# Patient Record
Sex: Male | Born: 1940
Health system: Southern US, Community
[De-identification: ages and names within clinical notes are randomized; demographics above are authoritative.]

## PROBLEM LIST (undated history)

## (undated) DIAGNOSIS — R519 Headache, unspecified: Secondary | ICD-10-CM

## (undated) DIAGNOSIS — E039 Hypothyroidism, unspecified: Secondary | ICD-10-CM

## (undated) DIAGNOSIS — F419 Anxiety disorder, unspecified: Secondary | ICD-10-CM

## (undated) DIAGNOSIS — F32A Depression, unspecified: Secondary | ICD-10-CM

## (undated) DIAGNOSIS — J189 Pneumonia, unspecified organism: Secondary | ICD-10-CM

## (undated) DIAGNOSIS — C801 Malignant (primary) neoplasm, unspecified: Secondary | ICD-10-CM

## (undated) HISTORY — PX: KNEE SURGERY: SHX244

## (undated) HISTORY — PX: HERNIA REPAIR: SHX51

## (undated) HISTORY — PX: PROSTATE BIOPSY: SHX241

## (undated) HISTORY — PX: ABDOMINAL SURGERY: SHX537

---

## 1999-03-30 ENCOUNTER — Encounter: Admission: RE | Admit: 1999-03-30 | Discharge: 1999-03-30 | Payer: Self-pay | Admitting: Family Medicine

## 1999-04-02 ENCOUNTER — Encounter: Payer: Self-pay | Admitting: Emergency Medicine

## 1999-04-02 ENCOUNTER — Emergency Department (HOSPITAL_COMMUNITY): Admission: EM | Admit: 1999-04-02 | Discharge: 1999-04-02 | Payer: Self-pay | Admitting: Emergency Medicine

## 2000-09-23 ENCOUNTER — Encounter: Admission: RE | Admit: 2000-09-23 | Discharge: 2000-09-23 | Payer: Self-pay | Admitting: Family Medicine

## 2000-09-23 ENCOUNTER — Encounter: Payer: Self-pay | Admitting: Family Medicine

## 2000-10-01 ENCOUNTER — Other Ambulatory Visit: Admission: RE | Admit: 2000-10-01 | Discharge: 2000-10-01 | Payer: Self-pay | Admitting: *Deleted

## 2000-10-01 ENCOUNTER — Encounter (INDEPENDENT_AMBULATORY_CARE_PROVIDER_SITE_OTHER): Payer: Self-pay | Admitting: Specialist

## 2000-10-01 ENCOUNTER — Encounter (INDEPENDENT_AMBULATORY_CARE_PROVIDER_SITE_OTHER): Payer: Self-pay | Admitting: *Deleted

## 2000-10-01 ENCOUNTER — Inpatient Hospital Stay (HOSPITAL_COMMUNITY): Admission: AD | Admit: 2000-10-01 | Discharge: 2000-10-29 | Payer: Self-pay | Admitting: *Deleted

## 2000-10-02 ENCOUNTER — Encounter: Payer: Self-pay | Admitting: *Deleted

## 2000-10-06 ENCOUNTER — Encounter: Payer: Self-pay | Admitting: Hematology and Oncology

## 2000-10-15 ENCOUNTER — Encounter: Payer: Self-pay | Admitting: *Deleted

## 2000-10-17 ENCOUNTER — Encounter: Payer: Self-pay | Admitting: *Deleted

## 2000-11-04 ENCOUNTER — Inpatient Hospital Stay (HOSPITAL_COMMUNITY): Admission: AD | Admit: 2000-11-04 | Discharge: 2000-11-20 | Payer: Self-pay | Admitting: *Deleted

## 2000-11-15 ENCOUNTER — Encounter: Payer: Self-pay | Admitting: *Deleted

## 2000-12-04 ENCOUNTER — Inpatient Hospital Stay (HOSPITAL_COMMUNITY): Admission: AD | Admit: 2000-12-04 | Discharge: 2000-12-25 | Payer: Self-pay | Admitting: *Deleted

## 2000-12-14 ENCOUNTER — Encounter: Payer: Self-pay | Admitting: *Deleted

## 2000-12-19 ENCOUNTER — Encounter: Payer: Self-pay | Admitting: *Deleted

## 2001-01-08 ENCOUNTER — Encounter (INDEPENDENT_AMBULATORY_CARE_PROVIDER_SITE_OTHER): Payer: Self-pay | Admitting: Specialist

## 2001-01-08 ENCOUNTER — Other Ambulatory Visit: Admission: RE | Admit: 2001-01-08 | Discharge: 2001-01-08 | Payer: Self-pay | Admitting: *Deleted

## 2001-01-21 ENCOUNTER — Inpatient Hospital Stay (HOSPITAL_COMMUNITY): Admission: AD | Admit: 2001-01-21 | Discharge: 2001-02-11 | Payer: Self-pay | Admitting: *Deleted

## 2001-02-03 ENCOUNTER — Encounter: Payer: Self-pay | Admitting: *Deleted

## 2001-02-17 ENCOUNTER — Encounter (HOSPITAL_COMMUNITY): Admission: RE | Admit: 2001-02-17 | Discharge: 2001-05-18 | Payer: Self-pay | Admitting: *Deleted

## 2001-03-05 ENCOUNTER — Inpatient Hospital Stay (HOSPITAL_COMMUNITY): Admission: EM | Admit: 2001-03-05 | Discharge: 2001-04-01 | Payer: Self-pay | Admitting: *Deleted

## 2001-03-16 ENCOUNTER — Encounter: Payer: Self-pay | Admitting: *Deleted

## 2001-04-08 ENCOUNTER — Encounter (INDEPENDENT_AMBULATORY_CARE_PROVIDER_SITE_OTHER): Payer: Self-pay | Admitting: Specialist

## 2001-04-08 ENCOUNTER — Other Ambulatory Visit: Admission: RE | Admit: 2001-04-08 | Discharge: 2001-04-08 | Payer: Self-pay | Admitting: Oncology

## 2001-05-29 ENCOUNTER — Encounter: Payer: Self-pay | Admitting: *Deleted

## 2001-05-29 ENCOUNTER — Ambulatory Visit (HOSPITAL_COMMUNITY): Admission: RE | Admit: 2001-05-29 | Discharge: 2001-05-29 | Payer: Self-pay | Admitting: *Deleted

## 2001-09-16 ENCOUNTER — Other Ambulatory Visit: Admission: RE | Admit: 2001-09-16 | Discharge: 2001-09-16 | Payer: Self-pay | Admitting: *Deleted

## 2001-09-16 ENCOUNTER — Encounter (INDEPENDENT_AMBULATORY_CARE_PROVIDER_SITE_OTHER): Payer: Self-pay | Admitting: *Deleted

## 2002-03-17 ENCOUNTER — Encounter (INDEPENDENT_AMBULATORY_CARE_PROVIDER_SITE_OTHER): Payer: Self-pay | Admitting: Specialist

## 2002-03-17 ENCOUNTER — Other Ambulatory Visit: Admission: RE | Admit: 2002-03-17 | Discharge: 2002-03-17 | Payer: Self-pay | Admitting: *Deleted

## 2002-10-29 ENCOUNTER — Other Ambulatory Visit: Admission: RE | Admit: 2002-10-29 | Discharge: 2002-10-29 | Payer: Self-pay | Admitting: *Deleted

## 2002-10-29 ENCOUNTER — Encounter (INDEPENDENT_AMBULATORY_CARE_PROVIDER_SITE_OTHER): Payer: Self-pay | Admitting: Specialist

## 2003-05-04 ENCOUNTER — Encounter (INDEPENDENT_AMBULATORY_CARE_PROVIDER_SITE_OTHER): Payer: Self-pay | Admitting: *Deleted

## 2003-05-04 ENCOUNTER — Ambulatory Visit (HOSPITAL_COMMUNITY): Admission: RE | Admit: 2003-05-04 | Discharge: 2003-05-04 | Payer: Self-pay | Admitting: Oncology

## 2003-08-09 ENCOUNTER — Encounter: Admission: RE | Admit: 2003-08-09 | Discharge: 2003-08-09 | Payer: Self-pay | Admitting: Family Medicine

## 2003-08-22 ENCOUNTER — Encounter (INDEPENDENT_AMBULATORY_CARE_PROVIDER_SITE_OTHER): Payer: Self-pay | Admitting: *Deleted

## 2003-08-22 ENCOUNTER — Ambulatory Visit (HOSPITAL_COMMUNITY): Admission: RE | Admit: 2003-08-22 | Discharge: 2003-08-22 | Payer: Self-pay | Admitting: Gastroenterology

## 2003-08-25 ENCOUNTER — Ambulatory Visit (HOSPITAL_COMMUNITY): Admission: RE | Admit: 2003-08-25 | Discharge: 2003-08-25 | Payer: Self-pay | Admitting: Gastroenterology

## 2003-09-14 ENCOUNTER — Encounter (INDEPENDENT_AMBULATORY_CARE_PROVIDER_SITE_OTHER): Payer: Self-pay | Admitting: *Deleted

## 2003-09-14 ENCOUNTER — Inpatient Hospital Stay (HOSPITAL_COMMUNITY): Admission: RE | Admit: 2003-09-14 | Discharge: 2003-10-01 | Payer: Self-pay | Admitting: General Surgery

## 2003-11-10 ENCOUNTER — Ambulatory Visit (HOSPITAL_COMMUNITY): Admission: RE | Admit: 2003-11-10 | Discharge: 2003-11-10 | Payer: Self-pay | Admitting: Oncology

## 2003-11-10 ENCOUNTER — Encounter (INDEPENDENT_AMBULATORY_CARE_PROVIDER_SITE_OTHER): Payer: Self-pay | Admitting: *Deleted

## 2004-04-07 ENCOUNTER — Ambulatory Visit: Payer: Self-pay | Admitting: Oncology

## 2004-05-14 ENCOUNTER — Emergency Department (HOSPITAL_COMMUNITY): Admission: EM | Admit: 2004-05-14 | Discharge: 2004-05-14 | Payer: Self-pay | Admitting: Emergency Medicine

## 2004-05-15 IMAGING — CR DG CHEST 2V
2 series · 2 of 2 positions shown · non-contrast
Comparison: 09/23/00.
COMPARISON: 09/23/00.

CLINICAL DATA: Cough and sneezing.  Question bronchitis or sinusitis.
 DIAGNOSTIC SINUSES COMPLETE

[view not recorded (1 of 2)]
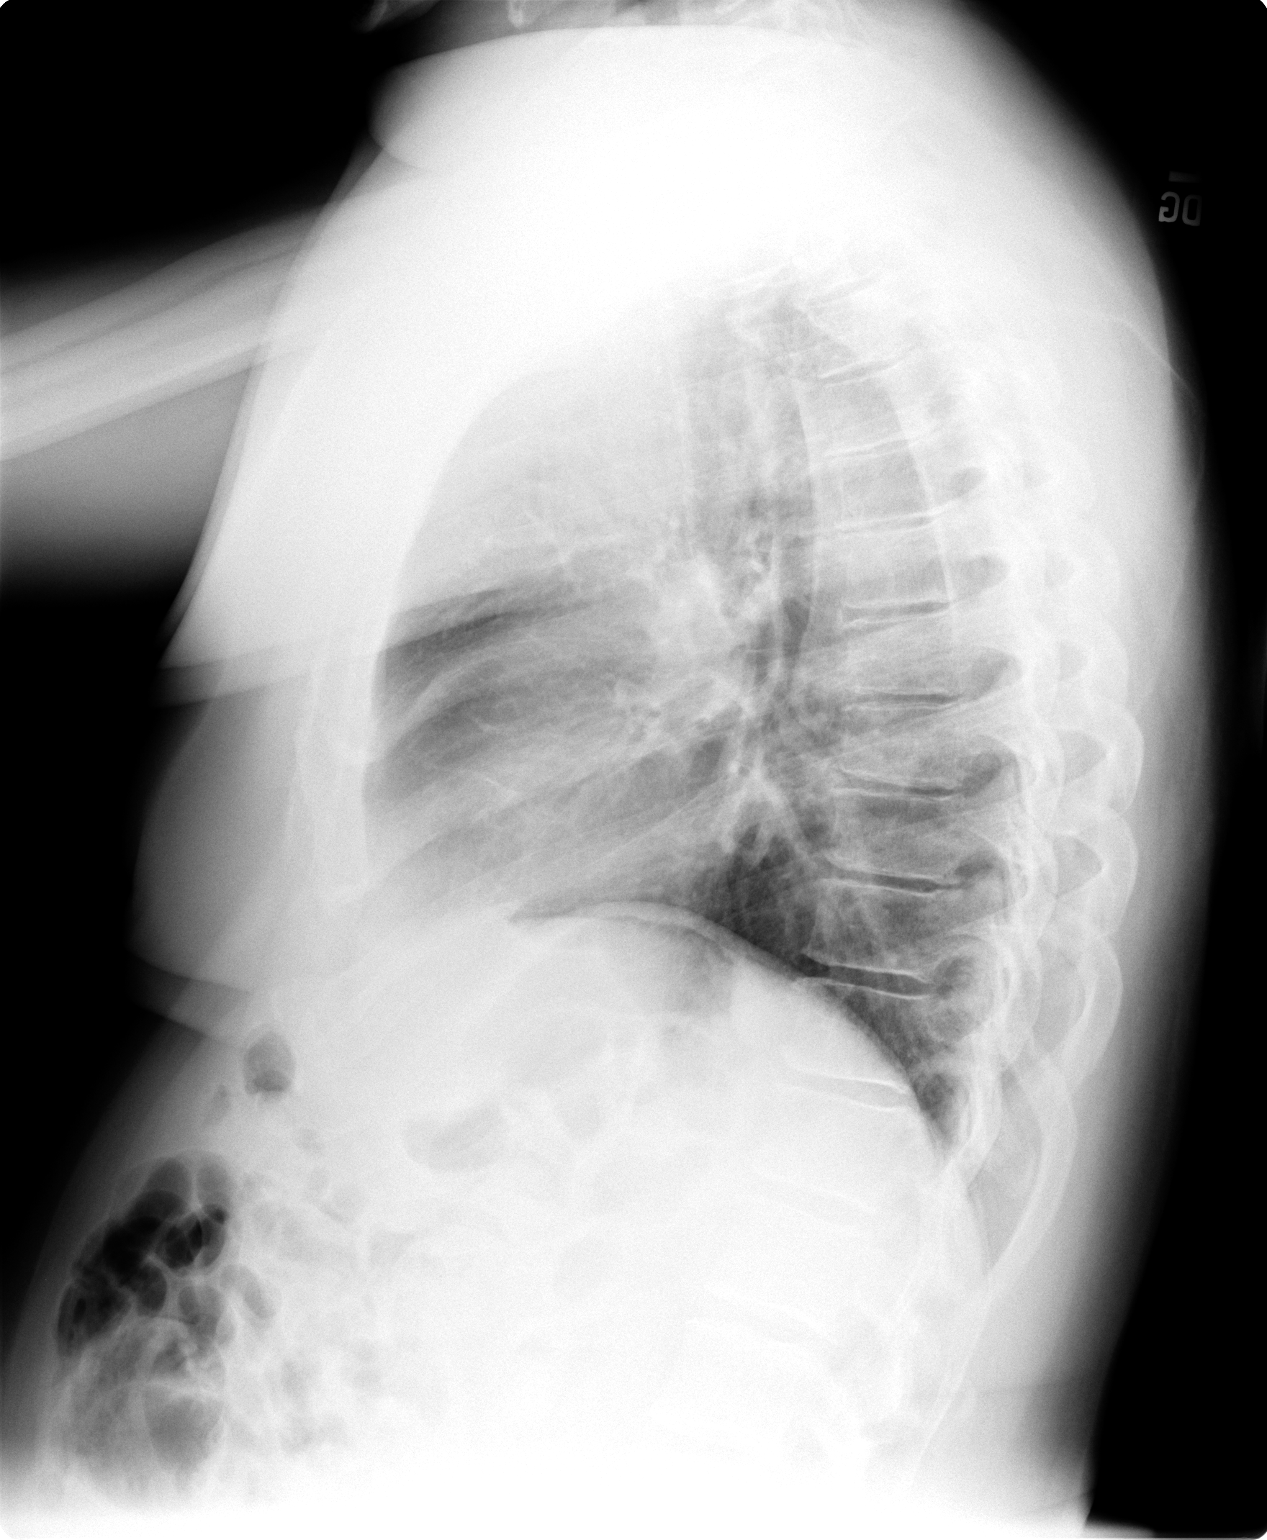

[view not recorded (2 of 2)]
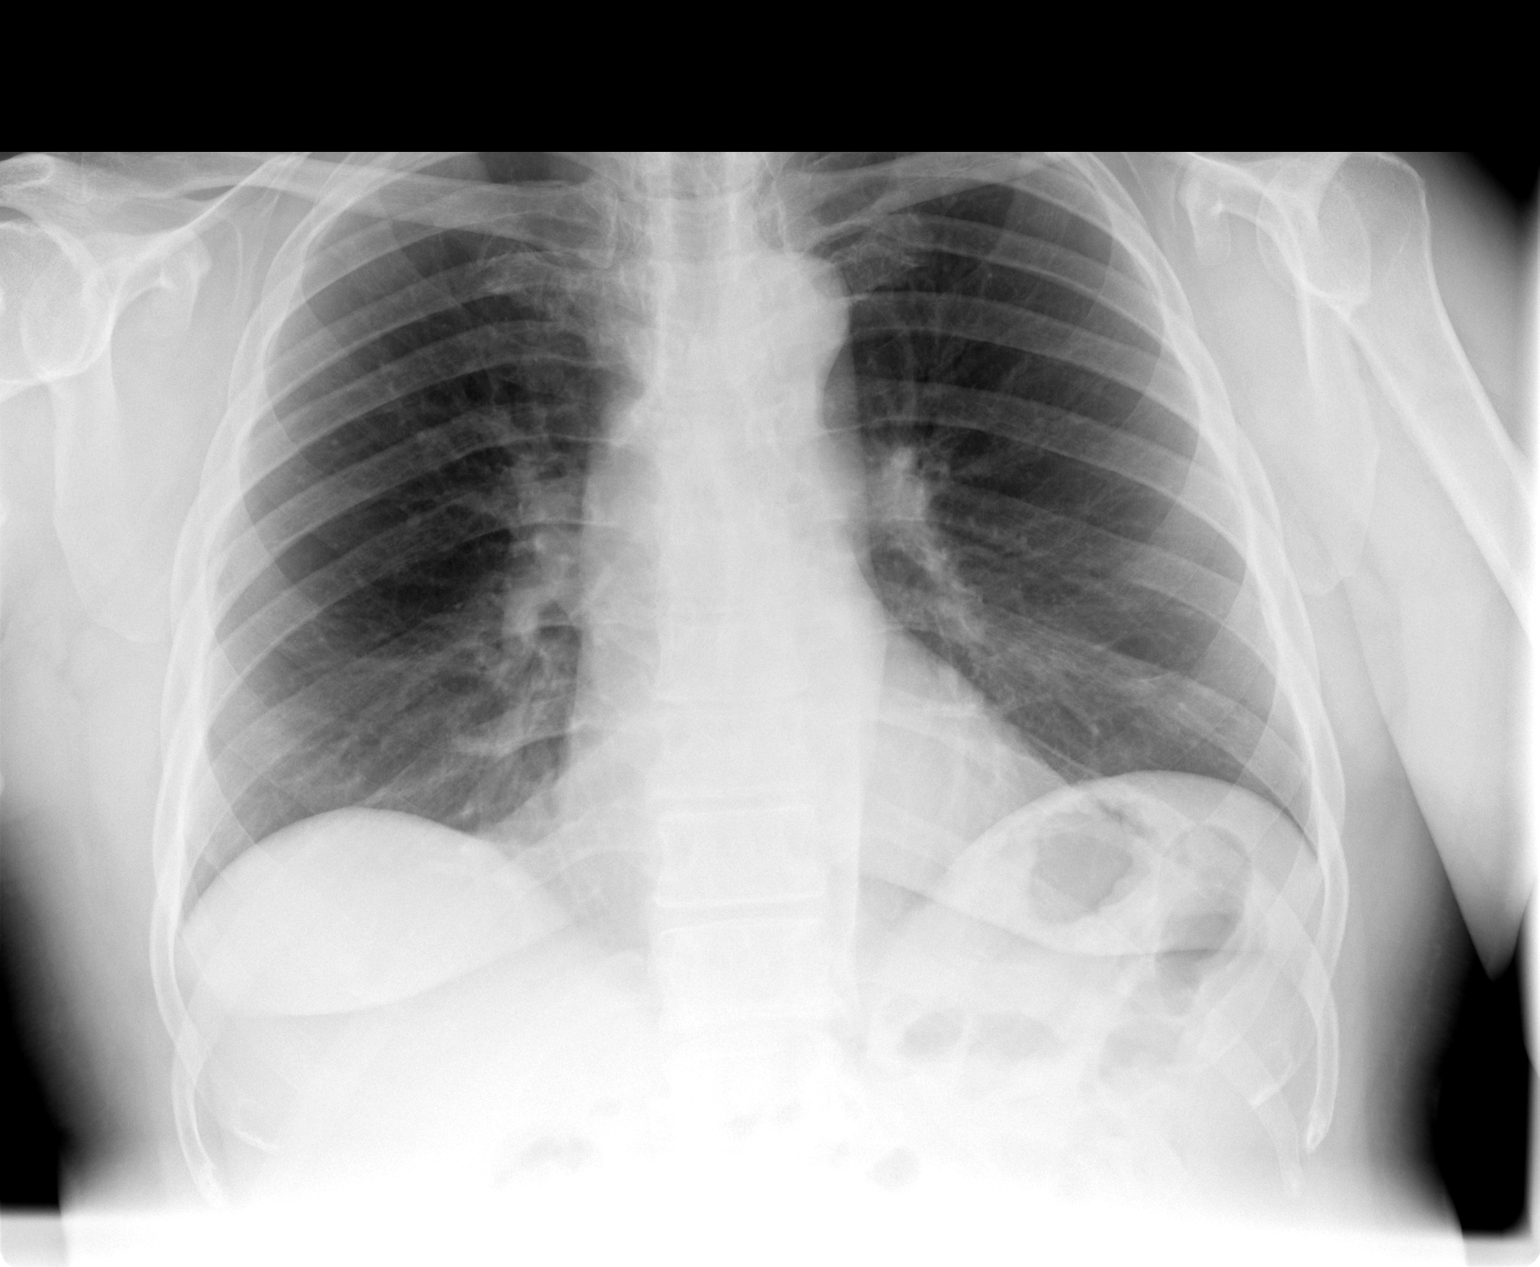

[2 of 2 positions shown; findings below may reference images not displayed]

Mild mucosal thickening in both maxillary sinuses appears stable.  The frontal, sphenoid, and ethmoid sinuses appear clear and no air fluid level is demonstrated.
 IMPRESSION
 Stable maxillary sinus mucosal thickening.
 PA AND LATERAL CHEST
The cardiomediastinal contours are stable.  There is mild peribronchial thickening, but no hyperinflation or confluent air space opacity.  There is no pleural effusion.  Osseous structures appear unremarkable.
 IMPRESSION
 Mild chronic peribronchial thickening.  No acute findings.

## 2004-05-31 ENCOUNTER — Ambulatory Visit: Payer: Self-pay | Admitting: Oncology

## 2004-05-31 ENCOUNTER — Ambulatory Visit (HOSPITAL_COMMUNITY): Admission: RE | Admit: 2004-05-31 | Discharge: 2004-05-31 | Payer: Self-pay | Admitting: Oncology

## 2004-05-31 ENCOUNTER — Encounter (INDEPENDENT_AMBULATORY_CARE_PROVIDER_SITE_OTHER): Payer: Self-pay | Admitting: *Deleted

## 2004-06-14 IMAGING — CR DG CHEST 2V
2 series · 2 of 2 positions shown · non-contrast
Comparison: 08/09/03.

CLINICAL DATA: Mass.  
 CHEST, TWO VIEWS ([DATE] HOURS)

[view not recorded (1 of 2)]
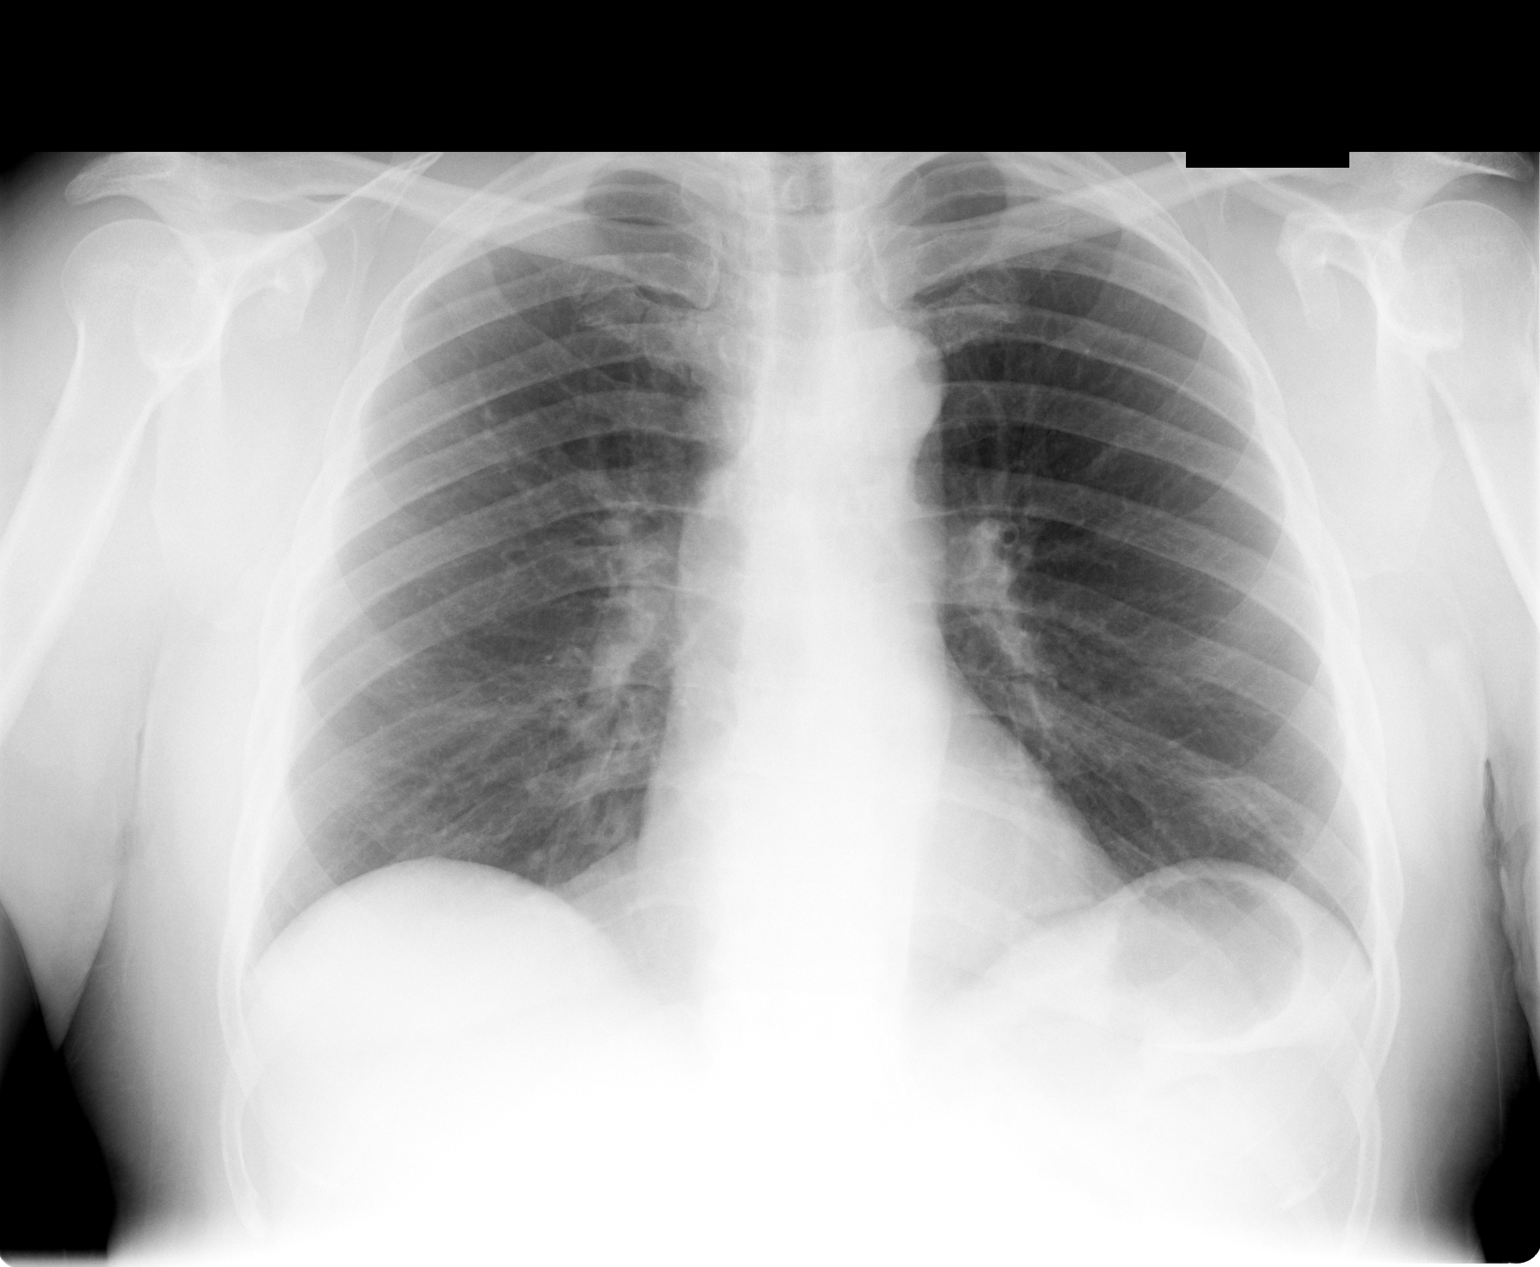

[view not recorded (2 of 2)]
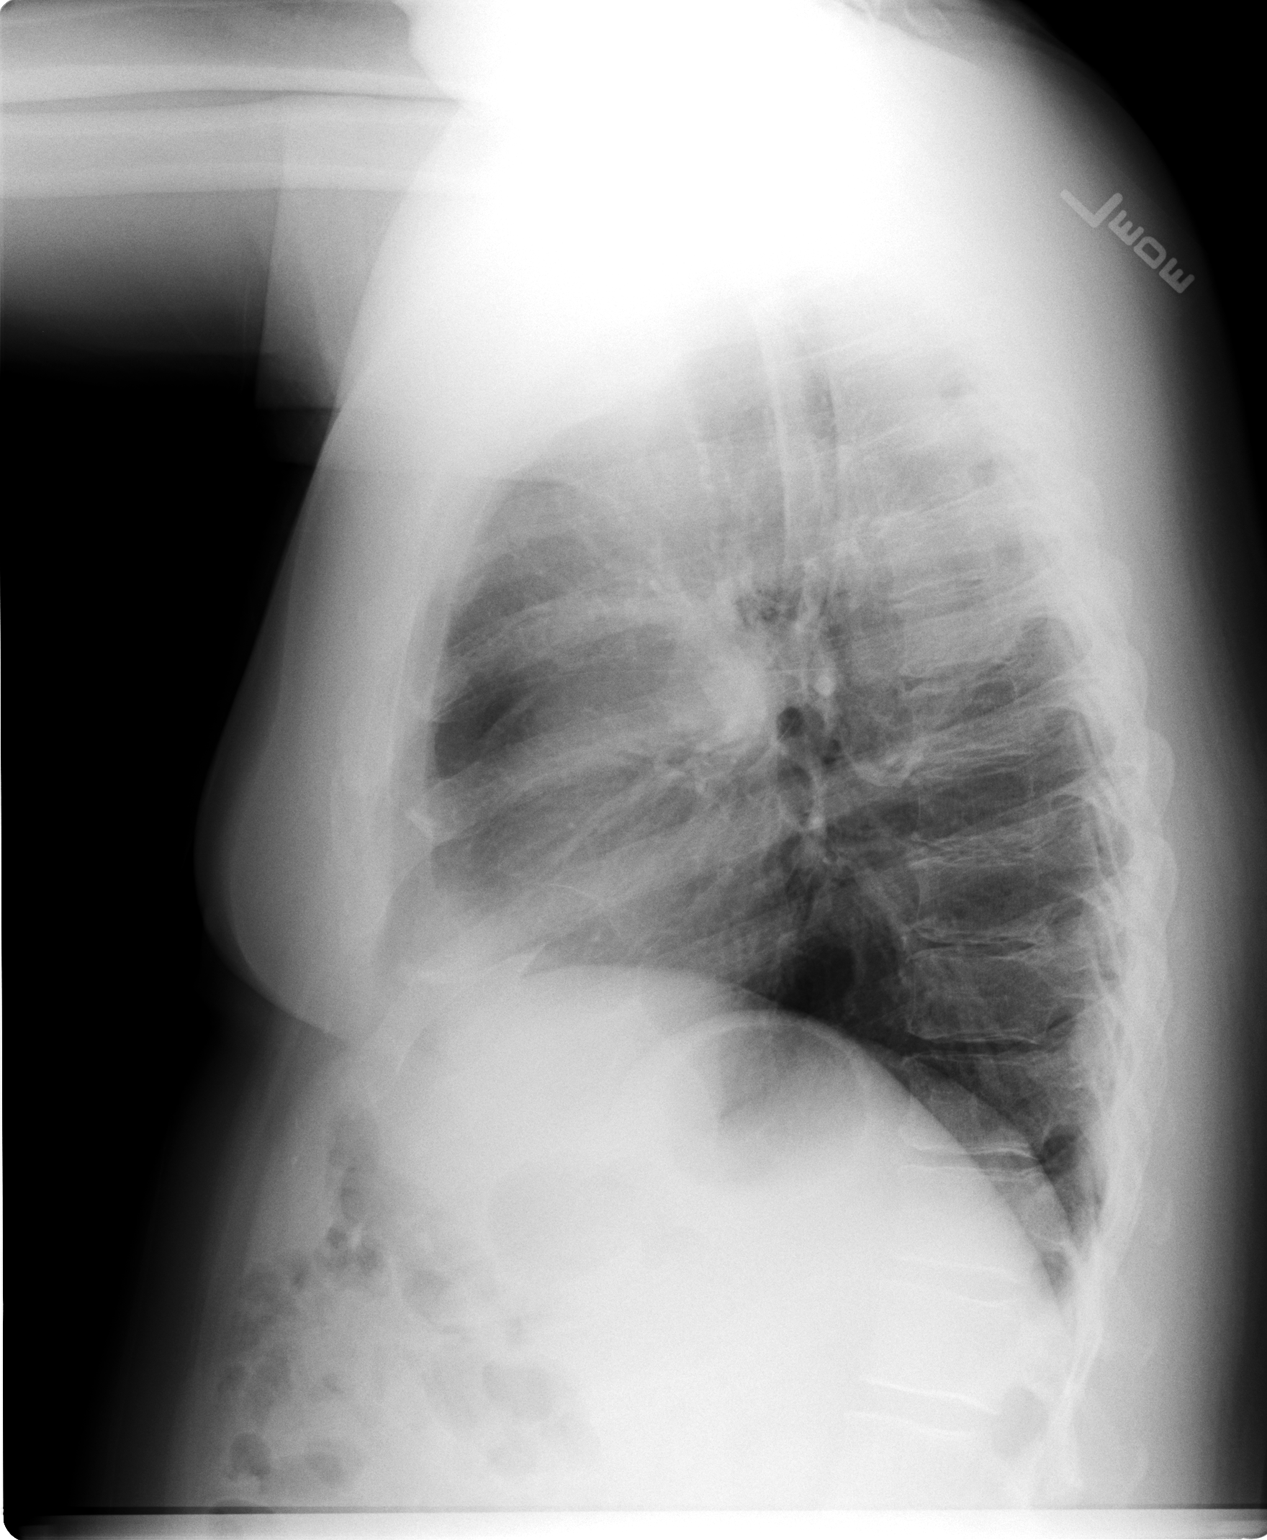

[2 of 2 positions shown; findings below may reference images not displayed]

FINDINGS: Peribronchial thickening is stable.  The heart is normal in size.  Triangular opacity at the right cardiophrenic angle is likely epicardial fat pad and this is stable.  No pneumothoraces or effusions are seen. 
 IMPRESSION
 Stable peribronchial thickening.

## 2004-09-05 ENCOUNTER — Ambulatory Visit (HOSPITAL_COMMUNITY): Admission: RE | Admit: 2004-09-05 | Discharge: 2004-09-05 | Payer: Self-pay | Admitting: Gastroenterology

## 2004-09-05 ENCOUNTER — Encounter (INDEPENDENT_AMBULATORY_CARE_PROVIDER_SITE_OTHER): Payer: Self-pay | Admitting: *Deleted

## 2004-11-03 ENCOUNTER — Inpatient Hospital Stay (HOSPITAL_COMMUNITY): Admission: RE | Admit: 2004-11-03 | Discharge: 2004-11-08 | Payer: Self-pay | Admitting: General Surgery

## 2005-01-03 ENCOUNTER — Ambulatory Visit: Payer: Self-pay | Admitting: Oncology

## 2005-01-03 ENCOUNTER — Encounter (INDEPENDENT_AMBULATORY_CARE_PROVIDER_SITE_OTHER): Payer: Self-pay | Admitting: *Deleted

## 2005-01-03 ENCOUNTER — Ambulatory Visit (HOSPITAL_COMMUNITY): Admission: RE | Admit: 2005-01-03 | Discharge: 2005-01-03 | Payer: Self-pay | Admitting: Oncology

## 2005-01-28 ENCOUNTER — Ambulatory Visit: Payer: Self-pay | Admitting: Oncology

## 2005-08-13 ENCOUNTER — Encounter: Admission: RE | Admit: 2005-08-13 | Discharge: 2005-08-13 | Payer: Self-pay | Admitting: Family Medicine

## 2005-08-29 ENCOUNTER — Ambulatory Visit (HOSPITAL_COMMUNITY): Admission: RE | Admit: 2005-08-29 | Discharge: 2005-08-29 | Payer: Self-pay | Admitting: Oncology

## 2005-08-29 ENCOUNTER — Ambulatory Visit: Payer: Self-pay | Admitting: Oncology

## 2005-08-29 ENCOUNTER — Encounter (INDEPENDENT_AMBULATORY_CARE_PROVIDER_SITE_OTHER): Payer: Self-pay | Admitting: *Deleted

## 2005-10-01 ENCOUNTER — Ambulatory Visit: Payer: Self-pay | Admitting: Oncology

## 2006-02-20 ENCOUNTER — Ambulatory Visit: Payer: Self-pay | Admitting: Oncology

## 2006-03-10 LAB — CBC WITH DIFFERENTIAL/PLATELET
Basophils Absolute: 0 10*3/uL (ref 0.0–0.1)
EOS%: 2.8 % (ref 0.0–7.0)
Eosinophils Absolute: 0.1 10*3/uL (ref 0.0–0.5)
HCT: 39.3 % (ref 38.7–49.9)
HGB: 13.6 g/dL (ref 13.0–17.1)
MCH: 32.2 pg (ref 28.0–33.4)
MCV: 92.9 fL (ref 81.6–98.0)
MONO%: 15.8 % — ABNORMAL HIGH (ref 0.0–13.0)
NEUT#: 1.7 10*3/uL (ref 1.5–6.5)
NEUT%: 47.5 % (ref 40.0–75.0)
Platelets: 146 10*3/uL (ref 145–400)

## 2007-05-12 ENCOUNTER — Encounter: Admission: RE | Admit: 2007-05-12 | Discharge: 2007-05-12 | Payer: Self-pay | Admitting: Family Medicine

## 2008-08-16 ENCOUNTER — Encounter: Admission: RE | Admit: 2008-08-16 | Discharge: 2008-08-16 | Payer: Self-pay | Admitting: Family Medicine

## 2009-02-14 ENCOUNTER — Encounter: Admission: RE | Admit: 2009-02-14 | Discharge: 2009-02-14 | Payer: Self-pay | Admitting: Family Medicine

## 2009-02-27 ENCOUNTER — Inpatient Hospital Stay (HOSPITAL_COMMUNITY): Admission: EM | Admit: 2009-02-27 | Discharge: 2009-03-02 | Payer: Self-pay | Admitting: Emergency Medicine

## 2009-03-07 ENCOUNTER — Inpatient Hospital Stay (HOSPITAL_COMMUNITY): Admission: RE | Admit: 2009-03-07 | Discharge: 2009-03-09 | Payer: Self-pay | Admitting: General Surgery

## 2009-06-14 ENCOUNTER — Emergency Department (HOSPITAL_COMMUNITY): Admission: EM | Admit: 2009-06-14 | Discharge: 2009-06-14 | Payer: Self-pay | Admitting: Emergency Medicine

## 2009-11-14 ENCOUNTER — Emergency Department (HOSPITAL_COMMUNITY): Admission: EM | Admit: 2009-11-14 | Discharge: 2009-11-14 | Payer: Self-pay | Admitting: Emergency Medicine

## 2010-09-03 LAB — URINALYSIS, ROUTINE W REFLEX MICROSCOPIC
Glucose, UA: NEGATIVE mg/dL
Nitrite: NEGATIVE
Specific Gravity, Urine: 1.023 (ref 1.005–1.030)
pH: 5.5 (ref 5.0–8.0)

## 2010-09-03 LAB — URINE CULTURE
Colony Count: NO GROWTH
Culture: NO GROWTH

## 2010-09-17 LAB — BASIC METABOLIC PANEL
BUN: 13 mg/dL (ref 6–23)
CO2: 24 mEq/L (ref 19–32)
Calcium: 8.9 mg/dL (ref 8.4–10.5)
GFR calc non Af Amer: >60 mL/min (ref >60)
Glucose, Bld: 113 mg/dL — ABNORMAL HIGH (ref 70–99)

## 2010-09-17 LAB — DIFFERENTIAL
Basophils Absolute: 0 10*3/uL (ref 0.0–0.1)
Eosinophils Absolute: 0 10*3/uL (ref 0.0–0.7)
Lymphs Abs: 0.4 10*3/uL — ABNORMAL LOW (ref 0.7–4.0)
Monocytes Relative: 22 % — ABNORMAL HIGH (ref 3–12)
Neutro Abs: 1.9 10*3/uL (ref 1.7–7.7)

## 2010-09-17 LAB — CBC
HCT: 38.9 % — ABNORMAL LOW (ref 39.0–52.0)
Hemoglobin: 13.3 g/dL (ref 13.0–17.0)
MCHC: 34.1 g/dL (ref 30.0–36.0)
Platelets: 128 10*3/uL — ABNORMAL LOW (ref 150–400)
RDW: 14.9 % (ref 11.5–15.5)

## 2010-09-21 LAB — DIFFERENTIAL
Basophils Relative: 0 % (ref 0–1)
Basophils Relative: 0 % (ref 0–1)
Eosinophils Absolute: 0.1 10*3/uL (ref 0.0–0.7)
Eosinophils Relative: 4 % (ref 0–5)
Lymphs Abs: 0.9 10*3/uL (ref 0.7–4.0)
Monocytes Absolute: 0.5 10*3/uL (ref 0.1–1.0)
Monocytes Relative: 15 % — ABNORMAL HIGH (ref 3–12)
Monocytes Relative: 8 % (ref 3–12)
Neutro Abs: 5.3 10*3/uL (ref 1.7–7.7)
Neutrophils Relative %: 53 % (ref 43–77)
Neutrophils Relative %: 82 % — ABNORMAL HIGH (ref 43–77)

## 2010-09-21 LAB — COMPREHENSIVE METABOLIC PANEL
ALT: 20 U/L (ref 0–53)
AST: 20 U/L (ref 0–37)
Albumin: 3.9 g/dL (ref 3.5–5.2)
Alkaline Phosphatase: 67 U/L (ref 39–117)
Alkaline Phosphatase: 65 U/L (ref 39–117)
BUN: 18 mg/dL (ref 6–23)
Calcium: 8.9 mg/dL (ref 8.4–10.5)
Calcium: 9.4 mg/dL (ref 8.4–10.5)
GFR calc Af Amer: >60 mL/min (ref >60)
GFR calc non Af Amer: >60 mL/min (ref >60)
Glucose, Bld: 121 mg/dL — ABNORMAL HIGH (ref 70–99)
Potassium: 3.7 mEq/L (ref 3.5–5.1)
Sodium: 139 mEq/L (ref 135–145)
Total Protein: 6.9 g/dL (ref 6.0–8.3)
Total Protein: 7.2 g/dL (ref 6.0–8.3)

## 2010-09-21 LAB — BASIC METABOLIC PANEL
CO2: 23 mEq/L (ref 19–32)
CO2: 24 mEq/L (ref 19–32)
Calcium: 8.2 mg/dL — ABNORMAL LOW (ref 8.4–10.5)
Chloride: 108 mEq/L (ref 96–112)
GFR calc Af Amer: >60 mL/min (ref >60)
GFR calc Af Amer: >60 mL/min (ref >60)
GFR calc non Af Amer: >60 mL/min (ref >60)
Glucose, Bld: 89 mg/dL (ref 70–99)
Potassium: 3.7 mEq/L (ref 3.5–5.1)
Sodium: 134 mEq/L — ABNORMAL LOW (ref 135–145)
Sodium: 139 mEq/L (ref 135–145)

## 2010-09-21 LAB — CBC
HCT: 41.8 % (ref 39.0–52.0)
Hemoglobin: 13.5 g/dL (ref 13.0–17.0)
Hemoglobin: 12.8 g/dL — ABNORMAL LOW (ref 13.0–17.0)
Hemoglobin: 14.1 g/dL (ref 13.0–17.0)
MCHC: 34 g/dL (ref 30.0–36.0)
MCHC: 33.7 g/dL (ref 30.0–36.0)
MCHC: 34.3 g/dL (ref 30.0–36.0)
Platelets: 156 10*3/uL (ref 150–400)
RBC: 3.92 MIL/uL — ABNORMAL LOW (ref 4.22–5.81)
RDW: 14.3 % (ref 11.5–15.5)
RDW: 14.2 % (ref 11.5–15.5)
WBC: 5.2 10*3/uL (ref 4.0–10.5)

## 2010-09-21 LAB — LIPASE, BLOOD: Lipase: <10 U/L — ABNORMAL LOW (ref 11–59)

## 2010-11-02 NOTE — H&P (Signed)
[place]. [facility]  Patient:    LESHAWN, STRAKA Visit Number: [id number] MRN: [id number]          Service Type: MED Location: 4E 0401 01 Attending Physician:  Marnee Guarneri Dictated by:   Aliene Altes, M.D. Admit Date:  03/05/2001                           History and Physical  HISTORY OF PRESENT ILLNESS:  Mr. 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  He did quite well and has completed at this point, three cycles of consolidative therapy.  He continues in a nice remission.  He has no specific complaints.  He is admitted for fluid hydration and his final cycle of consolidative therapy of idarubicin and ara-C.  PAST MEDICAL HISTORY:  This is remarkable only for some low grade hypertension.  MEDICATIONS:  He is on no medications at this point.  ALLERGIES:  No known drug allergies.  SOCIAL HISTORY:  He does not smoke and does not drink.  He is retired from the [employer].  FAMILY HISTORY:  Noncontributory for any cancer.  REVIEW OF SYSTEMS:  No headache, no dizziness, no chest pain, no shortness of breath, no bowel or bladder change, no focal neurologic complaints.  PHYSICAL EXAMINATION:  VITAL SIGNS:  He is afebrile, vitals are stable.  CHEST:  Clear.  CARDIOVASCULAR:  Regular rate and rhythm.  ABDOMEN:  Soft and nontender with no hepatosplenomegaly.  EXTREMITIES:  Full range of motion and no edema.  IMPRESSION:  A 70 year old gentleman with acute promyelocytic leukemia currently admitted for hydration overnight to begin his fourth and final consolidative cycle of ara-C and idarubicin for his acute promyelocytic leukemia.  He will also receive Nupogen support thereafter. Dictated by:   Aliene Altes, M.D. Attending Physician:   Marnee Guarneri DD:  03/06/01 TD:  03/06/01 Job: [id number]

## 2010-11-02 NOTE — Discharge Summary (Signed)
[place]. [facility]  Patient:    Kenneth Sanford, Kenneth Sanford                       MRN: [id number] Adm. Date:  [id number] Disc. Date: 12/25/00 Attending:  Aliene Altes                           Discharge Summary  HISTORY OF PRESENT ILLNESS AND HOSPITAL COURSE:  Kenneth Sanford is a very nice 70 year old gentleman with a history of acute promyelocytic leukemia who was admitted December 04, 2000, for scheduled chemotherapy.  This was his second consolidation with ara-C, idarubicin.  Over the course of his hospital admission he received a total of 1.94 g of cytarabine q.d. x 4 days, and 9.7 mg IV q.d. for four days of idarubicin.  He did quite well and was supported through his nadir, which occurred on December 11, 2000, which was day #7.  He was started on broad-spectrum antibiotic coverage for a fever spike. During febrile neutropenia cultures remained negative.  Chest x-ray was negative.  Etiology of the fever was unknown.  He defervesced slowly with Neupogen support and began to recover from his hemologic nadir to the point that on December 25, 2000, he was discharged home, in stable condition.  DISCHARGE DIAGNOSIS:  Acute promyelocytic leukemia, status post second consolidation.  FOLLOW-UP:  He will follow up with Dr. Aliene Altes in one week.  MEDICATIONS:  He will continue Tequin p.o. q.d. DD:  12/25/00 TD:  12/25/00 Job: [id number]

## 2010-11-02 NOTE — Discharge Summary (Signed)
Kenneth Sanford, Kenneth Sanford                          ACCOUNT NO.:  [id number]   MEDICAL RECORD NO.:  [id number]                   PATIENT TYPE:  INP   LOCATION:  5714                                 FACILITY:  [facility]   PHYSICIAN:  Leonie Man, M.D.                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   PROCEDURES IN THE HOSPITAL:  Right hemicolectomy.   COMPLICATIONS:  Prolonged postoperative ileus.   DISCHARGE CONDITION:  Improved.   HOSPITAL COURSE:  [name] is a 70 year old man referred for  presentation with rectal bleeding on colonoscopy and noted to have a sessile  lesion of the cecum which on biopsy showed a villous adenoma.  There was  some high grade dysplasia and carcinoma could not be ruled out.  The patient  was admitted, brought to the operating room on September 14, 2003, where he  underwent a right sided hemicolectomy.  His postoperative course has been  benign, and the patient began on liquids.  However, he subsequently  developed abdominal distention and pain with KUBs showing a diffuse  postoperative ileus.  The patient remained on NPO and was placed on TNA over  the ensuing days with resolution of his ileus.  At the time of discharge,  his wound was healing well, and he was tolerating a regular diet.   DISPOSITION:  He is being discharged to be followed for an office visit in  approximately two weeks.   DISCHARGE MEDICATIONS:  Vicodin one to two every four hours p.r.n. for pain.   ACTIVITY:  As tolerated.   DIET:  Not restricted.                                                Leonie Man, M.D.    PB/MEDQ  D:  11/15/2003  T:  11/15/2003  Job:  [id number]   cc:   Anselmo Rod, M.D.  [address]  [address]  [place]  [place] [address]  Fax: [phone]

## 2010-11-02 NOTE — Op Note (Signed)
NAME:  Kenneth Sanford, Kenneth Sanford                          ACCOUNT NO.:  [id number]   MEDICAL RECORD NO.:  [id number]                   PATIENT TYPE:  OUT   LOCATION:  XRAY                                 FACILITY:  [facility]   PHYSICIAN:  Anselmo Rod, M.D.               DATE OF BIRTH:  May 27, 1941   DATE OF PROCEDURE:  08/23/2003  DATE OF DISCHARGE:                                 OPERATIVE REPORT   PROCEDURE PERFORMED:  Colonoscopy with biopsies from the cecum.   ENDOSCOPIST:  Charna Elizabeth, M.D.   INSTRUMENT USED:  Olympus video colonoscope.   INDICATIONS FOR PROCEDURE:  The patient is a 70 year old African-American  male with a history of promyelocytic leukemia diagnosed and treated by  Valentino Hue. Magrinat, M.D. in 2000.  He is undergoing screening colonoscopy.  Rule out colonic polyps, masses, etc.   PREPROCEDURE PREPARATION:  Informed consent was procured from the patient.  The patient was fasted for eight hours prior to the procedure and prepped  with a bottle of magnesium citrate and a gallon of GoLYTELY the night prior  to the procedure.   PREPROCEDURE PHYSICAL:  The patient had stable vital signs.  Neck supple.  Chest clear to auscultation.  S1 and S2 regular.  Abdomen soft with normal  bowel sounds.   DESCRIPTION OF PROCEDURE:  The patient was placed in left lateral decubitus  position and sedated with 60 mg of Demerol and 6 mg of Versed in slow  incremental doses.  Once the patient was adequately sedated and maintained  on low flow oxygen and continuous cardiac monitoring, the Olympus video  colonoscope was advanced from the rectum to the cecum.  Small internal  hemorrhoids were seen on retroflexion.  A cecal mass was identified.  Multiple biopsies were done to rule out malignancy.  The patient tolerated  the procedure well without complications.   IMPRESSION:  1. Cecal mass, biopsies done,  results pending.  2. Small internal hemorrhoids.  3. No evidence of diverticulosis, no  other masses or polyps seen.   RECOMMENDATIONS:  1. Await pathology results.  2. CT scan of abdomen and pelvis with contrast.  3. Check CEA levels, CBC, BMET and hepatic function panel.  4. Surgical evaluation by Leonie Man, M.D.                                               Anselmo Rod, M.D.    JNM/MEDQ  D:  08/23/2003  T:  08/24/2003  Job:  [id number]   cc:   Leonie Man, M.D.  [address]  [address]  [place]  [place] [address]  Fax: [phone]   Valentino Hue. Magrinat, M.D.  [address] [facility]  [place]  [place] [address]  Fax: [phone]

## 2010-11-02 NOTE — Op Note (Signed)
NAMECLINTON, Sanford                ACCOUNT NO.:  [id number]   MEDICAL RECORD NO.:  [id number]          PATIENT TYPE:  OBV   LOCATION:  1513                         FACILITY:  [facility]   PHYSICIAN:  Leonie Man, M.D.   DATE OF BIRTH:  09-30-1940   DATE OF PROCEDURE:  11/02/2004  DATE OF DISCHARGE:                                 OPERATIVE REPORT   PREOPERATIVE DIAGNOSIS:  Ventral incisional hernia   POSTOPERATIVE DIAGNOSIS:  Ventral incisional hernia.   PROCEDURE:  Repair of ventral incisional hernia, open technique with onlay  mesh (Parietex).   SURGEON:  Dr. Lurene Shadow.   ASSISTANT:  Dr. Lebron Conners.   ANESTHESIA:  General.   SPECIMENS:  To pathology none.   ESTIMATED BLOOD LOSS:  75 mL.   COMPLICATIONS:  None.   DRAINS:  19-French Blake drains x2.   NOTE:  [name] is a 70 year old man, who is status post right  hemicolectomy, who presents now with a rather large incisional hernia,  extending from his epigastrium down to the pubis.  He has been having  intermittent episodes of incarceration.  He comes to the operating room  after risks and potential benefits of surgery have been discussed, all  questions answered, and consent obtained.   PROCEDURE:  Following the induction of satisfactory general anesthesia, the  abdomen is prepped and draped to be included in a sterile operative field.  Exploratory laparotomy carried out through a long midline incision, opening  up his old scar cicatrix down to the hernia sac.  The hernia is opened and  entered and adhesions to the hernia sac lysed  There hernia sac is excised  from the abdominal wall and back to the normal fascia.  I then raised skin  flaps laterally, superiorly, and inferiorly, carrying it out past the edge  of the rectus muscles.  We then made relaxing incisions laterally so as to  bring the edges of the rectus muscle together.  All the areas of dissection  within the abdomen were checked for hemostasis  and noted to be dry.  Sponge  and instrument counts were then verified.  The abdominal wall was then  closed with a running suture of #1 PDS in a double-stranded fashion.  Following this, Parietex mesh was placed over the primary repair, and this  was sutured into the anterior abdominal wall with interrupted #1 Novofil  sutures at the 12 o'clock, 6 o'clock, 3 o'clock, 9 o'clock, 2 o'clock, 4  o'clock, and 10 o'clock position.  The remaining spaces were then filled  with stainless steel staples which were placed into the mesh and the muscle  around the entire edge of the mesh.  This having been accomplished, the  entire wound cavity was then irrigated with normal saline and then with  bacitracin solution.  The sponge and instrument counts were again verified  and the subcutaneous tissues closed with a running suture of 2-0 Vicryl  after two 19-French Blake drains were placed under the flaps for  drainage.  The skin was then closed with stainless steel staples.  Sterile  dressings were  applied, the anesthetic reversed, and the patient removed  from the operating room to the recovery room in stable condition.  He  tolerated the procedure well.      PB/MEDQ  D:  11/02/2004  T:  11/02/2004  Job:  [id number]

## 2010-11-02 NOTE — Op Note (Signed)
Kenneth Sanford, Kenneth Sanford                          ACCOUNT NO.:  [id number]   MEDICAL RECORD NO.:  [id number]                   PATIENT TYPE:  INP   LOCATION:  2899                                 FACILITY:  [facility]   PHYSICIAN:  Leonie Man, M.D.                DATE OF BIRTH:  02-Jun-1941   DATE OF PROCEDURE:  09/14/2003  DATE OF DISCHARGE:                                 OPERATIVE REPORT   PREOPERATIVE DIAGNOSIS:  Villous tumor of the cecum.   POSTOPERATIVE DIAGNOSIS:  Villous tumor of the cecum.  Pathology pending.   OPERATION PERFORMED:  Right hemicolectomy with ileocolic anastomosis.   SURGEON:  Leonie Man, M.D.   ASSISTANT:  Gita Kudo, M.D.   ANESTHESIA:  General.   INDICATIONS FOR PROCEDURE:  The patient is a 70 year old gentleman who  underwent screening colonoscopy and noted to have a sessile lesion in the  cecum.  Biopsies show a villous tumor with focal high grade dysplasia.  The  patient comes to the operating room now for right hemicolectomy being fully  apprised of the risks and potential benefits of surgery.   DESCRIPTION OF PROCEDURE:  Following the induction of general anesthesia,  the patient's abdomen was prepped and draped to be included in the sterile  operative field.  Exploratory laparotomy was carried out through a midline  incision extending deeply above and below the umbilicus.  Deepened through  the skin and subcutaneous tissues and across the linea alba into the  abdomen.  There were no adhesions from any previous operations on the  abdomen, the liver right and left lobes were free of any evidence of  metastatic disease.  There was no peritoneal or parietal or visceral  seeding.  On palpation of the mesentery there were no mesenteric lymph nodes  felt.  The lesion was felt to be a soft sessile lesion within the cecum.  None of the rest of the small or large intestine that was palpated or  visualized showed any evidence of any additional  disease processes.   Dissection was begun along the right retroperitoneal reflection, dissecting  the distal ileum and up along the retroperitoneal reflection up to the  hepatic flexure.  The duodenal sweep was identified and dissected free and  retracted posteriorly.  The hepatic flexure was taken between clamps and  secured with ties of 2-0 silk.  Dissection was carried along the proximal  transverse colon to just before the area of the middle colic vessels.  The  colon was fully mobilized from the retroperitoneum.  I used GIA staples to  transect the colon distally and the distal ileum proximally.  The  intervening mesenteric vein was taken between clamps and secured with ties  of 2-0 silk except for the major pedicles which were doubly tied with 0  silk.  Specimen was then removed in its entirety and forwarded for  pathologic evaluation.  On inspection  of the retroperitoneum, both the  ureter and the gonadal vessels were noted to be intact.  A functional end-to-  end anastomosis was then carried out with the use of a stapling device and  closed in two layers with a running suture of 3-0 Vicryl followed by  interrupted Lembert sutures of 3-0 silk.  The resulting anastomosis was  noted to be widely patent.  The mesenteric defect was closed with  interrupted sutures of 2-0 silk.  The entire abdominal cavity was then  thoroughly irrigated with normal saline.  Sponge, instrument and sharp  counts were then verified.  All areas of dissection were checked for  hemostasis and noted to be dry.  The abdomen was then closed in layers as  follows.  The midline closed with a running double PDS suture.  Subcutaneous  tissue was then  irrigated.  The skin was closed with stainless steel staples.  Sterile  dressings were then applied to the wound.  Anesthetic was reversed and the  patient removed from the operating room to the recovery room in stable  condition.  He tolerated the procedure  well.                                               Leonie Man, M.D.    PB/MEDQ  D:  09/14/2003  T:  09/14/2003  Job:  [id number]   cc:   Dr. Angelita Ingles Blunt   Jyothi Elsie Amis, M.D.  [address]  [address]  [place]  [place] [address]  Fax: [phone]

## 2010-11-02 NOTE — H&P (Signed)
[place]. [facility]  Patient:    Kenneth Sanford, Kenneth Sanford                       MRN: [id number] Adm. Date:  [id number] Attending:  Aliene Altes CC:         Charlynne Pander. Bruna Potter, M.D.   History and Physical  REASON FOR ADMISSION:  Elective chemotherapy.  DIAGNOSIS:  Acute promyelocytic leukemia.  HISTORY OF PRESENT ILLNESS:  The patient is a 70 year old gentleman with a history of acute promyelocytic leukemia recently diagnosed in April who was immediately induced with idarubicin and ATRA. Quickly went into a complete remission per ______ analysis of post induction bone marrow biopsy. Has currently had one cycle of consolidation ara-C and idarubicin. Tolerated that nicely. This was given on May 22. At this point, he has had full recovery of his treatment-related cytopenia and is ready for admission for his second cycle of consolidation with the same regimen.  PAST MEDICAL HISTORY:  Unremarkable. Has been very healthy all of his life.  SOCIAL HISTORY:  Does not smoke, does not drink.  FAMILY HISTORY:  Noncontributory.  CURRENT MEDICATIONS:  He is on no medications.  ALLERGIES:  No known drug allergies.  REVIEW OF SYSTEMS:  No headache. No dizziness. No chest pain. No shortness of breath. No bowel or bladder changes. No neurologic complaints. No difficulty ambulating.  PHYSICAL EXAMINATION:  VITAL SIGNS:  His weight is 181, blood pressure 131/81, temperature 97.3, pulse 119, respirations 20.  HEENT:  Pupils equal, round, and reactive to light and accommodation. Extraocular muscles intact. Sclerae nonicteric.  CHEST:  Clear.  HEART:  Regular rate and rhythm.  ABDOMEN:  Soft, nontender. No hepatosplenomegaly. No palpable lymphadenopathy.  EXTREMITIES:  Full range of motion. No edema.  NEUROLOGICAL:  Alert and oriented x 3. Cranial nerves 2 through 12 grossly intact. Motor and sensation intact globally.  IMPRESSION:  Acute promyelocytic leukemia in CR at  this point. Currently, status post induction and consolidation.  1. Admitted electively for consolidation. 2. He will receive ara-C 1.94 g q.d. x 4 days and 9.7 mg of idarubicin q.d. x    4 days. DD:  12/04/00 TD:  12/04/00 Job: [id number]

## 2010-11-02 NOTE — Discharge Summary (Signed)
[place]. [facility]  Patient:    TAVION, SENKBEIL                       MRN: [id number] Adm. Date:  [id number] Disc. Date: [id number] Attending:  Aliene Altes                           Discharge Summary  [facility] COURSE:  Mr. Barstow is a 70 year old gentleman who was admitted for first consolidation for acute promyelocytic leukemia.  He was admitted and thereafter started on his consolidative chemotherapy which included cytarabine 1.94 g IV q.d. x 4 and idarubicin 9.7 mg IV q.d. x 4.  He tolerated chemotherapy quite well, had no specific complaints.  His nadir was approximately day 11 after consolidation and by day 16, had full hematologic recovery.  He had no specific complaints and on November 20, 2000, he was discharged home in stable condition.  DISCHARGE DIAGNOSIS:  Acute promyelocytic leukemia in remission, status post first consolidation.  FOLLOW-UP:  He will follow up in the clinic next week with Aliene Altes, M.D. DD:  11/20/00 TD:  11/21/00 Job: [id number]

## 2010-11-02 NOTE — Discharge Summary (Signed)
[place]. [facility]  Patient:    NAHZIR, POHLE                         MRN: [id number] Adm. Date:  12/04/00 Disc. Date: 12/25/00 Dictator:   Aliene Altes, M.D.                           Discharge Summary  NO DICTATION DD:  12/25/00 TD:  12/25/00 Job: [id number]

## 2010-11-02 NOTE — H&P (Signed)
[place]. [facility]  Patient:    Kenneth Sanford, Kenneth Sanford                       MRN: [id number] Adm. Date:  [id number] Attending:  Aliene Altes                         History and Physical  HISTORY OF PRESENT ILLNESS:  Kenneth Sanford is a 70 year old gentleman, who presented on April 17 to clinic as a new patient received on referral from Dr. Bruna Potter for a history of neutropenia which was documented on his last physical exam.  He had white cells slightly above 1000, although no symptomatology.  He presented yesterday for continued work-up and management of his neutropenia. We quickly did a bone marrow biopsy which revealed numerous blasts with the presence of Auer rods.  He was admitted for continued work-up, staging, and treatment of a probable leukemia.  PAST MEDICAL HISTORY:  Noncontributory.  SOCIAL HISTORY:  Does not smoke.  Does not drink.  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  FAMILY HISTORY:  Noncontributory.  MEDICATIONS:  He is not on any medicines.  ALLERGIES:  No known drug allergies.  REVIEW OF SYSTEMS:  Occasional night sweats.  No fever, chills.  No weight loss.  No decrease in appetite.  His ______  status is zero.  No bowel or bladder changes.  No focal neurologic complaints.  PHYSICAL EXAMINATION:  VITAL SIGNS:  He is afebrile.  Vitals are stable.  CHEST:  Clear.  HEART:  Regular rate and rhythm.  ABDOMEN:  Soft, nontender, no hepatosplenomegaly.  EXTREMITIES:  Range of motion, no edema.  NEUROLOGIC:  Alert and oriented x 3.  Cranial nerves 2-12 grossly intact. Motor and sensation intact globally.  IMPRESSION:  A 70 year old gentleman with what appears to be an acute leukemia, most likely acute myelogenous leukemia.  We are going to order all of the appropriate studies and set him up for a MUGA scan as well as a triple-lumen Hickman catheter, hydrate him with bicarb fluids for alkalinization and prepare for  chemotherapy induction. DD:  10/02/00 TD:  10/02/00 Job: [id number]

## 2010-11-02 NOTE — Discharge Summary (Signed)
Kenneth Sanford, Kenneth Sanford                ACCOUNT NO.:  [id number]   MEDICAL RECORD NO.:  [id number]          PATIENT TYPE:  INP   LOCATION:  1513                         FACILITY[facility]   PHYSICIAN:  Leonie Man, M.D.   DATE OF BIRTH:  26-Jul-1940   DATE OF ADMISSION:  11/02/2004  DATE OF DISCHARGE:  11/08/2004                                 DISCHARGE SUMMARY   ADMISSION DIAGNOSIS:  Ventral hernia.   DISCHARGE DIAGNOSIS:  Ventral hernia.   PROCEDURE:  Repair of ventral hernia.   COMPLICATIONS:  None.   CONDITION ON DISCHARGE:  Improved.   NOTE:  Mr. Teng Decou is a 70 year old gentleman who underwent colon  resection for a large sessile polyp in the past.  He has since developed a  ventral hernia and was admitted to the hospital for repair.  On the day of  admission, Nov 02, 2004, the patient underwent open repair of his ventral  hernia.  His postoperative course has been benign with normal resumption of  diet and activity.  He is being discharged now to be followed up in the  office in two weeks.   DISCHARGE MEDICATIONS:  Vicodin 1-2 every 4 hours p.r.n. pain.   ACTIVITY:  As tolerated.   DIET:  Unrestricted.       PB/MEDQ  D:  12/05/2004  T:  12/05/2004  Job:  [id number]

## 2010-11-02 NOTE — H&P (Signed)
[place]. [facility]  Patient:    Kenneth Sanford, Kenneth Sanford                       MRN: [id number] Adm. Date:  [id number] Attending:  Aliene Altes CC:         Dr. Bruna Potter   History and Physical  HISTORY OF PRESENT ILLNESS:  Kenneth Sanford is a 70 year old gentleman who is currently day #33 status post induction per the IDA regimen for acute promyelocytic leukemia.  At this point he has complete count recovery.  Bone marrow biopsy done four days ago showed a complete remission with cytogenetic response.  FISH is negative for the translocation.  He is admitted today for a first cycle of consolidation therapy with ara-C and idarubicin for four days. He has no specific complaints.  PAST HISTORY:  Significant only for the above mentioned.  SOCIAL HISTORY:  Does not smoke, does not drink.  FAMILY HISTORY:  Noncontributory.  MEDICATIONS:  He is currently on no medications.  ALLERGIES:  No known drug allergies.  REVIEW OF SYSTEMS:  No headache, no dizziness, no chest pain, no shortness of breath, no bowel or bladder changes, no focal neurologic complaints, no difficulty ambulating.  PHYSICAL EXAMINATION:  VITAL SIGNS:  Currently his weight is 182, blood pressure 125/79, temperature 99.8, pulse 107, respirations 20.  HEENT:  Pupils equal, round, and reactive to light and accommodating. Extraocular muscles intact.  Sclerae nonicteric.  CHEST:  Clear.  HEART:  Regular rate and rhythm.  ABDOMEN:  Soft, nontender, no hepatosplenomegaly, no palpable lymphadenopathy.  EXTREMITIES:  Good range of motion, no edema.  NEUROLOGIC:  Alert and oriented x 3.  Cranial nerves 2-12 grossly intact. Motor and sensation are intact globally.  IMPRESSION:  Acute promyelocytic leukemia, currently recovered hematologically from first induction with idarubicin and ATRA at this point in a cytogenetic and a hematologic remission with a hematologic recovery, admitted for cycle #1 of  consolidation with ara-C and idarubicin.  We will give him cytarabine 1 g/sqm and idarubicin 5 mg/sqm over a four-day period. DD:  11/04/00 TD:  11/04/00 Job: [id number]

## 2010-11-02 NOTE — Discharge Summary (Signed)
[place]. [facility]  Patient:    Kenneth Sanford, Kenneth Sanford Visit Number: [id number] MRN: [id number]          Service Type: MED Location: 4E [id number] [id number] Attending Physician:  Marnee Guarneri Dictated by:   Aliene Altes, M.D. Admit Date:  03/05/2001                             Discharge Summary  Kenneth Sanford is a very pleasant 70 year old gentleman who was admitted October 01, 2000.  He had a two week history of isolated neutropenia with no symptoms. Referred for this and seen today in the clinic.  His first visit in the clinic today we did a bone marrow biopsy and found out on peripheral smear that it was loaded with myeloid blasts and ______ rods.  He was therefore admitted on August 17 for further workup and preparation for induction therapy for acute myelogenous leukemia.  Over the course of his early hospitalization his white count was 1.88, hemoglobin 16.8, hematocrit 48.9, platelet count 172.  On October 02, 2000 he underwent a right IJ Hickman catheter placement without complications.  He also had a MUGA scan ordered which revealed adequate cardiac ejection to receive chemotherapy.  On April 19 as part of his early induction he began 44 mg of Atra p.o. b.i.d.  Tolerated this well without any intervening complications.  On April 22 he began idarubicin 24 mg IV q.d., the 22nd, 24th, 26th, and 28th.  Again, he tolerated this quite well in the setting of receiving ______ syndrome prophylaxis with allopurinol and alkalinization of his IV fluids.  He did extremely well during the early course of his induction therapy.  On day #4 he did spike a fever to 101 and Primaxin was started.  Cultures remained negative during this hospitalization. He was covered extremely well with antibiotics including Primaxin, Diflucan, and acyclovir.  However, he continued to run low grade fevers with no obvious source.  He approached his nadir on day #12 and on May 5 which was day #17  of induction therapy he was started on ______ with a complication of chills and reactions near anaphylaxis.  This was immediately stopped.  We continued his supportive care, replacing electrolytes as needed.  On May 9 with continued fevers he was switched to amphotericin B which he tolerated fairly well except for a few rigors.  Cultures again remained negative.  On May 10 patient underwent bone marrow biopsy which revealed a nice remission.  Cytogenics were sent off to [facility].  On day 27 which was May 15 he was discharged home in stable condition to continued p.o. Atra.  PROCEDURE:  Right IJ catheter placement, MUGA scan, bone marrow biopsy.  Right IJ placement was done on April 18.  Bone marrow biopsy was performed May 15.  DISCHARGE DIAGNOSES: 1. Acute promyelocytic leukemia. 2. Adult-onset diabetes.  DISCHARGE MEDICATIONS: 1. Atra 40 mg p.o. b.i.d. 2. K-Dur 40 mEq one tablet p.o. q.d. for four days.  DISCHARGE INSTRUCTIONS:  He will follow-up with Dr. Aliene Altes within a week of his discharge home. Dictated by:   Aliene Altes, M.D. Attending Physician:  Marnee Guarneri DD:  03/11/01 TD:  03/12/01 Job: [id number]

## 2010-11-02 NOTE — Op Note (Signed)
Kenneth Sanford, Kenneth Sanford                ACCOUNT NO.:  [id number]   MEDICAL RECORD NO.:  [id number]          PATIENT TYPE:  AMB   LOCATION:  ENDO                         FACILITY:  [facility]   PHYSICIAN:  Anselmo Rod, M.D.  DATE OF BIRTH:  Oct 22, 1940   DATE OF PROCEDURE:  09/05/2004  DATE OF DISCHARGE:                                 OPERATIVE REPORT   PROCEDURE PERFORMED:  Colonoscopy with multiple cold biopsies.   ENDOSCOPIST:  Anselmo Rod, M.D.   INSTRUMENT USED:  Olympus video colonoscope.   INDICATION FOR PROCEDURE:  A 70 year old African-American male with a  history of acute promyelocytic leukemia diagnosed and treated in the past,  undergoing colonoscopy for follow-up of a large villous adenoma with high  grade dysplasia removed from the cecum by a right hemicolectomy about a year  ago.  Rule out recurrent polyps, masses, etc.   PREPROCEDURE PREPARATION:  Informed consent was procured from the patient.  The patient was fasted for 8 hours prior to the procedure and prepped with a  bottle of magnesium citrate and a gallon of GoLYTELY the night prior to the  procedure.  Risks and benefits of the procedure including a 10% miss rate of  cancer and polyps were discussed with the patient, as well.   PREPROCEDURE PHYSICAL:  The patient had stable vital signs.  Neck supple.  Chest clear to auscultation.  S1, S2 regular.  Abdomen soft with normal  bowel sounds.   DESCRIPTION OF PROCEDURE:  The patient was placed in the left lateral  decubitus position, sedated with 60 mg of Demerol and 6 mg of Versed in slow  incremental doses.  Once the patient was adequately sedated and maintained  on low-flow oxygen and continuous cardiac monitoring, the Olympus video  colonoscope was advanced from the rectum to the anastomosis at 70 cm without  difficulty.  There was evidence of sigmoid diverticula in early stages of  formation.  Small internal hemorrhoids were seen on retroflexion.  The  prominent fold was biopsied from the anastomosis, prominent lymphoid  follicles were seen.  The rest of the exam was unremarkable.  No other  masses or polyps were identified.  The patient tolerated the procedure well  without complications.   IMPRESSION:  1.  The patient is status post right hemicolectomy.  Anastomosis noted at 70      cm.  Prominent fold biopsied from close to the anastomosis.  2.  Early sigmoid diverticulosis.  3.  Small internal hemorrhoids.   RECOMMENDATIONS:  1.  Await pathology results.  2.  Repeat colonoscopy and further plan of action will be decided depending      on pathology results.  3.  Avoid all nonsteroidals including aspirin for the next two weeks.  4.  High fiber diet with liberal fluid intake has been advocated.  Brochures      on diverticulosis have been given to the patient for his education.  5.  Outpatient followup as need arises in the future.      JNM/MEDQ  D:  09/05/2004  T:  09/05/2004  Job:  [id number]   cc:   Valentino Hue. Magrinat, M.D.  [address] [facility]  [place]  [place] [address]  Fax: [phone]   Leonie Man, M.D.  [address]  [address]  [place]  [place] [address]

## 2010-11-02 NOTE — Discharge Summary (Signed)
[place]. [facility]  Patient:    Kenneth Sanford, Kenneth Sanford Visit Number: [id number] MRN: [id number]          Service Type: MED Location: 6700 [id number] Attending Physician:  Erlene Senters Dictated by:   Aliene Altes, M.D. Adm. Date:  01/21/2001 Disc. Date: 02/11/01                             Discharge Summary  REASON FOR ADMISSION:  Elective chemotherapy for acute promyelocytic leukemia, consolidation cycle #3.  HOSPITAL COURSE:  Kenneth Sanford is a very nice 70 year old gentleman who originally was diagnosed with acute promyelocytic leukemia in February of 2002, achieving a rapid remission after completion of induction therapy with AIDA trial.  He did extremely well and followed that up with two cycles of consolidative therapy consisting of cytarabine and idarubicin.  He was admitted on January 21, 2001.  He underwent hydration and began consolidative therapy.  Over the course of four days he received 1960 mg of cytarabine as well as 9.8 mg of idarubicin.  After the completion of his chemotherapy, Nupogen support was administered.  He did extremely well during the course of this hospitalization.  Nadir counts were documented on day #12.  He did spike a fever on day #14, and blood cultures revealed a gram negative rod.  He suffered signs of sepsis including systolic blood pressures in the 70s which did not respond to bolus therapy of fluids.  T-max was 104.1.  He was transferred to a monitored setting where dopamine was administered. Broad spectrum antibiotics included Fortaz, vancomycin, and gentamicin were administered and within 24 hours dopamine had titrated off, his blood pressure had improved.  Given that this was a gram negative rod, sensitivities returned and vancomycin was discontinued on day #15.  He continued to do well.  There were no other signs of fever spikes.  Also during his nadir he received Diflucan and acyclovir prophylaxis.  An interesting  complication which has been persistent through each consolidative phase have been headache which have resolved with stopping acyclovir.  He had headaches during this admission. Acyclovir was stopped and his headaches resolved this time as well.  His pancytopenia slowly improved.  Over the course of his hospitalization he required three transfusions of platelets and one transfusion of packed red blood cells.  Also during his hospitalization, he suffered from recurrent and painful hemorrhoids which responded nicely to Anusol and Nupercainal.  At the time of his discharge, his white blood cells were 1.9, hemoglobin 10.4, and platelet count was 12,000. There were no significant electrolyte abnormalities.  DISCHARGE DIAGNOSES: 1. Acute promyelocytic leukemia, status post consolidative cycle #3. 2. History of gram negative rod sepsis in the hospital with this    consolidative cycle. 3. Hemorrhoids responding nicely to symptomatic care.  DISCHARGE MEDICATIONS:  He is being discharged home on Anusol and Nupercainal as recommended.  He will receive Nupogen therapy as an outpatient in the clinic for the next three to five days.  FOLLOW-UP:  He will follow up with me one week after discharge.  DISCHARGE INSTRUCTIONS:  Complete all scheduled shots of Nupogen and to follow up with me in clinic next week.  He will do routine catheter maintenance at home as per instructions. Dictated by:   Aliene Altes, M.D. Attending Physician:  Erlene Senters DD:  02/11/01 TD:  02/11/01 Job: [id number]

## 2010-11-02 NOTE — Discharge Summary (Signed)
Kenneth Sanford  Patient:    Kenneth Sanford, Kenneth Sanford Visit Number: [id number] MRN: [id number]          Service Type: MED Location: 4E 0409 01 Attending Physician:  Marnee Guarneri Dictated by:   Rosemarie Ax, N.P. Admit Date:  03/05/2001 Discharge Date: 04/01/2001                             Discharge Summary  CONSULTS:  None.  PROCEDURES:  IV antibiotics, IV fluids, transfusion of packed red blood cells x 6, transfusion of platelets x 3.  DISCHARGE DIAGNOSES: 1. Acute promyelocytic leukemia, status post consolidative cycle #4. 2. Diabetes mellitus, adult onset, controlled by diet. 3. Hemorrhoids. 4. Pancytopenia. 5. Thrombocytopenia.  BRIEF HISTORY:  The patient is a pleasant 70 year old gentleman who originally was diagnosed with acute promyelocytic leukemia in February 2002.  He achieved a rapid remission after completion of induction therapy.  PAST MEDICAL HISTORY:  Remarkable for low-grade hypertension.  MEDICATIONS:  None.  ALLERGIES:  No known drug allergies.  ADMISSION LABORATORY DATA:  On September 20, WBC 2.5, RBC 2.87, hemoglobin 9.6, hematocrit 27.5, platelets at 175,000.  Sodium 141, potassium 3.3, chloride 109, CO2 28, glucose 151, BUN 10, creatinine 1.1, calcium 8.5, total protein 5.7 and albumin 3.2.  FILMS:  Chest x-ray on September 30, impression was no acute abnormalities.  HOSPITAL COURSE:  The patient was initially hydrated overnight to begin his forth and final consolidation cycle of ara-C and idarubicin for his acute promyelocytic leukemia.  He did quite well and on day #12, he was noted to have a low-grade temperature, again on day #15 as well as again on #19, #20, #22, #23 and #24.  He has since been afebrile.  Vital signs have remained stable throughout this hospitalization.  He received supportive GCSF.  Again, he received Nupogen 300 mcg subcutaneous q.d. beginning on March 11, 2001 and on October 1, was  transfused for 2 units of packed red blood cells and begun on Primaxin 500 mg IV q.6h.  The patient has also had repeated problems with hemorrhoids and again during this hospitalization, we will attempt, after discharge and after counts are stabilized to refer him for a surgical consult for this.  The patient received two more units of packed red blood cells on October 4 as well as 1 pack of platelets on October 4.  He was again transfused on October 9 for 2 units of packed red blood cells and on October 10 and October 14, he received platelets.  Throughout this hospitalization, he has had no high fever spikes, no chills, He was placed on Diflucan for thrush, this resolved quite rapidly.  At the time of discharge, his platelets were still at 30,000.  The plan is to have him return to the office the day after discharge for CBC.  If platelets are below 10,000, we will transfuse at that time.  The patient is known to have headaches with each admission and this time was really no exception.  He did have a headache, we are still unclear as to what causes these.  This did resolve fairly quickly.  The patients hemorrhoidal pain did respond to Anusol and Nupercainal ointment, as well as to sitz baths. The patient was discharged on all prior home medications.  ACTIVITIES:  There were no restrictions.  DIET:  No concentrated sweets.  WOUND CARE:  Not applicable.  SPECIAL INSTRUCTIONS:  To call for fever and  to return for follow-up appointment with Dr. Katrinka Blazing in two weeks.  Again, to call for fever, temperature over 101, or bleeding.  The patient is also, due to his low platelets, requested to visit the office tomorrow, April 02, 2001, for labs at the cancer center in the afternoon. Dictated by:   Rosemarie Ax, N.P. Attending Physician:  Marnee Guarneri DD:  04/01/01 TD:  04/01/01 Job: [id number]

## 2010-11-02 NOTE — H&P (Signed)
[place]. [facility]  Patient:    AVERIE, MEINER                       MRN: [id number] Adm. Date:  [id number] Attending:  Aliene Altes                         History and Physical  CHIEF COMPLAINT/HISTORY OF PRESENT ILLNESS: Mr. Zhen is a 70 year old gentleman, who was originally diagnosed with acute promyelocytic leukemia back in February 2002, who achieved rapid remission after completion of induction therapy with the AIDA trial.  He did quite well and has not completed two cycles of consolidation with a maintenance of remission.  He is currently admitted today for his third cycle of consolidation with idarubicin and ara-C.   PAST MEDICAL HISTORY: Remarkable only for some low-grade hypertension.  SOCIAL HISTORY: He does not smoke and does not drink.  Retired from the [employer].  FAMILY HISTORY: Noncontributory for any cancer.  MEDICATIONS: He is currently on no medications.  ALLERGIES: No known drug allergies.  REVIEW OF SYSTEMS: No headache, no dizziness, no chest pain, no shortness of breath, no bowel or bladder changes.  No focal neurologic complaints.  No difficulty ambulating.  PHYSICAL EXAMINATION:  VITAL SIGNS: TEMP 97.2 degrees, heart rate 103, respirations 16, blood pressure 100/78.  Height 5 feet 6 inches.  Weight 186.4 pounds.  HEENT: PERRLA.  EOMI.  Sclerae nonicteric.  CHEST: Clear.  HEART: Regular rate and rhythm.  ABDOMEN: Soft, nontender, no hepatosplenomegaly, no palpable lymphadenopathy, catheter site looks good, no infection noted, no erythema, no swelling, no tenderness.  EXTREMITIES: Range of motion, no edema.  NEUROLOGIC: Alert and oriented x 3.  Cranial nerves 2-12 grossly intact. Motor and sensation intact globally.  IMPRESSION: Acute promyelocytic leukemia, currently in remission, admitted for third cycle of consolidation therapy with ara-C and idarubicin.  PLAN: We will begin hydration tonight and  chemotherapy to follow thereafter with supportive care. DD:  01/21/01 TD:  01/22/01 Job: [id number]

## 2014-01-26 ENCOUNTER — Emergency Department (HOSPITAL_COMMUNITY)
Admission: EM | Admit: 2014-01-26 | Discharge: 2014-01-26 | Disposition: A | Discharge status: Home / Self Care | Payer: Medicare Other | Attending: Emergency Medicine | Admitting: Emergency Medicine

## 2014-01-26 ENCOUNTER — Encounter (HOSPITAL_COMMUNITY): Payer: Self-pay | Admitting: Emergency Medicine

## 2014-01-26 DIAGNOSIS — R040 Epistaxis: Secondary | ICD-10-CM | POA: Diagnosis not present

## 2014-01-26 DIAGNOSIS — Z8589 Personal history of malignant neoplasm of other organs and systems: Secondary | ICD-10-CM | POA: Insufficient documentation

## 2014-01-26 DIAGNOSIS — Z79899 Other long term (current) drug therapy: Secondary | ICD-10-CM | POA: Insufficient documentation

## 2014-01-26 HISTORY — DX: Malignant (primary) neoplasm, unspecified: C80.1

## 2014-01-26 LAB — CBC WITH DIFFERENTIAL/PLATELET
BASOS PCT: 0 % (ref 0–1)
Basophils Absolute: 0 10*3/uL (ref 0.0–0.1)
Eosinophils Absolute: 0.1 10*3/uL (ref 0.0–0.7)
Eosinophils Relative: 5 % (ref 0–5)
HCT: 38.8 % — ABNORMAL LOW (ref 39.0–52.0)
HEMOGLOBIN: 13.1 g/dL (ref 13.0–17.0)
Lymphocytes Relative: 38 % (ref 12–46)
Lymphs Abs: 1.1 10*3/uL (ref 0.7–4.0)
MCH: 30.8 pg (ref 26.0–34.0)
MCHC: 33.8 g/dL (ref 30.0–36.0)
MCV: 91.1 fL (ref 78.0–100.0)
MONOS PCT: 14 % — AB (ref 3–12)
Monocytes Absolute: 0.4 10*3/uL (ref 0.1–1.0)
NEUTROS ABS: 1.2 10*3/uL — AB (ref 1.7–7.7)
NEUTROS PCT: 43 % (ref 43–77)
Platelets: 146 10*3/uL — ABNORMAL LOW (ref 150–400)
RBC: 4.26 MIL/uL (ref 4.22–5.81)
RDW: 14.1 % (ref 11.5–15.5)
WBC: 2.8 10*3/uL — ABNORMAL LOW (ref 4.0–10.5)

## 2014-01-26 LAB — BASIC METABOLIC PANEL
ANION GAP: 11 (ref 5–15)
BUN: 18 mg/dL (ref 6–23)
CHLORIDE: 105 meq/L (ref 96–112)
CO2: 23 mEq/L (ref 19–32)
Calcium: 8.9 mg/dL (ref 8.4–10.5)
Creatinine, Ser: 1 mg/dL (ref 0.50–1.35)
GFR calc non Af Amer: 72 mL/min — ABNORMAL LOW (ref >90)
GFR, EST AFRICAN AMERICAN: 83 mL/min — AB (ref >90)
Glucose, Bld: 104 mg/dL — ABNORMAL HIGH (ref 70–99)
POTASSIUM: 4.2 meq/L (ref 3.7–5.3)
Sodium: 139 mEq/L (ref 137–147)

## 2014-01-26 LAB — PROTIME-INR
INR: 1 (ref 0.00–1.49)
PROTHROMBIN TIME: 13.2 s (ref 11.6–15.2)

## 2014-01-26 MED ORDER — OXYMETAZOLINE HCL 0.05 % NA SOLN
2.0000 | Freq: Once | NASAL | Status: AC
  Administered 2014-01-26: 2 via NASAL
  Filled 2014-01-26: qty 15

## 2014-01-26 NOTE — ED Notes (Signed)
MD Docherty at bedside. 

## 2014-01-26 NOTE — ED Notes (Signed)
No bleeding from nostrils

## 2014-01-26 NOTE — Discharge Instructions (Signed)
Nosebleed °A nosebleed can be caused by many things, including: °· Getting hit hard in the nose. °· Infections. °· Dry nose. °· Colds. °· Medicines. °Your doctor may do lab testing if you get nosebleeds a lot and the cause is not known. °HOME CARE  °· If your nose was packed with material, keep it there until your doctor takes it out. Put the pack back in your nose if the pack falls out. °· Do not blow your nose for 12 hours after the nosebleed. °· Sit up and bend forward if your nose starts bleeding again. Pinch the front half of your nose nonstop for 20 minutes. °· Put petroleum jelly inside your nose every morning if you have a dry nose. °· Use a humidifier to make the air less dry. °· Do not take aspirin. °· Try not to strain, lift, or bend at the waist for many days after the nosebleed. °GET HELP RIGHT AWAY IF:  °· Nosebleeds keep happening and are hard to stop or control. °· You have bleeding or bruises that are not normal on other parts of the body. °· You have a fever. °· The nosebleeds get worse. °· You get lightheaded, feel faint, sweaty, or throw up (vomit) blood. °MAKE SURE YOU:  °· Understand these instructions. °· Will watch your condition. °· Will get help right away if you are not doing well or get worse. °Document Released: 03/12/2008 Document Revised: 08/26/2011 Document Reviewed: 03/12/2008 °[employer]® Patient Information ©2015 [employer]. This information is not intended to replace advice given to you by your health care provider. Make sure you discuss any questions you have with your health care provider. ° °

## 2014-01-26 NOTE — ED Provider Notes (Signed)
CSN: [id number]     Arrival date & time 01/26/14  1713 History   First MD Initiated Contact with Patient 01/26/14 1748     Chief Complaint  Patient presents with  . Epistaxis     (Consider location/radiation/quality/duration/timing/severity/associated sxs/prior Treatment) Patient is a 73 y.o. male presenting with nosebleeds. The history is provided by the patient. No language interpreter was used.  Epistaxis Location:  R nare Severity:  Moderate Duration:  10 minutes Timing:  Intermittent Progression:  Waxing and waning Chronicity:  New Context comment:  After he blew nose Relieved by: brown paper under nose. Worsened by:  Nothing tried Associated symptoms: no congestion, no cough, no dizziness, no facial pain, no fever, no headaches, no sneezing, no sore throat and no syncope     Past Medical History  Diagnosis Date  . Cancer    Past Surgical History  Procedure Laterality Date  . Knee surgery    . Hernia repair    . Abdominal surgery     No family history on file. History  Substance Use Topics  . Smoking status: Not on file  . Smokeless tobacco: Not on file  . Alcohol Use: No    Review of Systems  Constitutional: Negative for fever, activity change, appetite change and fatigue.  HENT: Positive for nosebleeds. Negative for congestion, facial swelling, rhinorrhea, sneezing, sore throat and trouble swallowing.   Eyes: Negative for photophobia and pain.  Respiratory: Negative for cough, chest tightness and shortness of breath.   Cardiovascular: Negative for chest pain, leg swelling and syncope.  Gastrointestinal: Negative for nausea, vomiting, abdominal pain, diarrhea and constipation.  Endocrine: Negative for polydipsia and polyuria.  Genitourinary: Negative for dysuria, urgency, decreased urine volume and difficulty urinating.  Musculoskeletal: Negative for back pain and gait problem.  Skin: Negative for color change, rash and wound.  Allergic/Immunologic: Negative  for immunocompromised state.  Neurological: Negative for dizziness, facial asymmetry, speech difficulty, weakness, numbness and headaches.  Psychiatric/Behavioral: Negative for confusion, decreased concentration and agitation.      Allergies  Acyclovir and related and Amphotericin b  Home Medications   Prior to Admission medications   Medication Sig Start Date End Date Taking? Authorizing Provider  acetaminophen (TYLENOL) 500 MG tablet Take 1,000 mg by mouth every 6 (six) hours as needed (For knee pain.).   Yes Historical Provider, MD  tamsulosin (FLOMAX) 0.4 MG CAPS capsule Take 0.8 mg by mouth at bedtime.   Yes Historical Provider, MD   BP 145/94  Pulse 81  Temp(Src) 98.4 F (36.9 C) (Oral)  Resp 16  SpO2 98% Physical Exam  Constitutional: He is oriented to person, place, and time. He appears well-developed and well-nourished. No distress.  HENT:  Head: Normocephalic and atraumatic.  Nose:    Mouth/Throat: No oropharyngeal exudate.  Eyes: Pupils are equal, round, and reactive to light.  Neck: Normal range of motion. Neck supple.  Cardiovascular: Normal rate, regular rhythm and normal heart sounds.  Exam reveals no gallop and no friction rub.   No murmur heard. Pulmonary/Chest: Effort normal and breath sounds normal. No respiratory distress. He has no wheezes. He has no rales.  Abdominal: Soft. Bowel sounds are normal. He exhibits no distension and no mass. There is no tenderness. There is no rebound and no guarding.  Musculoskeletal: Normal range of motion. He exhibits no edema and no tenderness.  Neurological: He is alert and oriented to person, place, and time.  Skin: Skin is warm and dry.  Psychiatric: He has a  normal mood and affect.    ED Course  Procedures (including critical care time) Labs Review Labs Reviewed  CBC WITH DIFFERENTIAL - Abnormal; Notable for the following:    WBC 2.8 (*)    HCT 38.8 (*)    Platelets 146 (*)    Neutro Abs 1.2 (*)     Monocytes Relative 14 (*)    All other components within normal limits  BASIC METABOLIC PANEL - Abnormal; Notable for the following:    Glucose, Bld 104 (*)    GFR calc non Af Amer 72 (*)    GFR calc Af Amer 83 (*)    All other components within normal limits  PROTIME-INR    Imaging Review No results found.   EKG Interpretation None      MDM   Final diagnoses:  Epistaxis    Pt is a 73 y.o. male with Pmhx as above who presents with two episodes of epistaxis today, each lasting less than 10 mins. Bleeding currently controlled, able to visualize small amt of bleeding in R anterior nasal septum.  VSS, pt in NAD. Hb stable, INR 1.  Afrin sprayed in nose. I feel she is safe for d/c home. Return precautions given for new or worsening symptoms including worsening or uncontrolled bleeding. Instructions given for afrin and direct pressure should rebleeding occur.          Ernestina Patches, MD 01/26/14 [phone]

## 2014-01-26 NOTE — ED Notes (Signed)
Per pt, states second nose bleed today-history of leukemia-states blew nose today and it started bleeding-bleeding has stopped temporarily

## 2014-01-26 NOTE — ED Notes (Signed)
Bed: WA21 Expected date:  Expected time:  Means of arrival:  Comments: EMS fall  

## 2014-01-26 NOTE — ED Notes (Signed)
Pt ambulated to restroom with steady gait.

## 2014-01-26 NOTE — ED Notes (Addendum)
Pt reports 2nd nose bleed lasted about 20 mins and occurred after blowing his nose. Pt holding an ice pack on head upon assessment.  He denies pain. No bleeding at this time. Patient NOT taking blood thinners.

## 2014-05-22 ENCOUNTER — Emergency Department (HOSPITAL_COMMUNITY)
Admission: EM | Admit: 2014-05-22 | Discharge: 2014-05-22 | Disposition: A | Discharge status: Home / Self Care | Payer: Medicare Other | Attending: Emergency Medicine | Admitting: Emergency Medicine

## 2014-05-22 ENCOUNTER — Encounter (HOSPITAL_COMMUNITY): Payer: Self-pay | Admitting: Emergency Medicine

## 2014-05-22 DIAGNOSIS — Z87891 Personal history of nicotine dependence: Secondary | ICD-10-CM | POA: Insufficient documentation

## 2014-05-22 DIAGNOSIS — Z859 Personal history of malignant neoplasm, unspecified: Secondary | ICD-10-CM | POA: Insufficient documentation

## 2014-05-22 DIAGNOSIS — H578 Other specified disorders of eye and adnexa: Secondary | ICD-10-CM | POA: Insufficient documentation

## 2014-05-22 DIAGNOSIS — R609 Edema, unspecified: Secondary | ICD-10-CM | POA: Insufficient documentation

## 2014-05-22 DIAGNOSIS — R040 Epistaxis: Secondary | ICD-10-CM | POA: Diagnosis not present

## 2014-05-22 DIAGNOSIS — R0981 Nasal congestion: Secondary | ICD-10-CM | POA: Insufficient documentation

## 2014-05-22 MED ORDER — AZITHROMYCIN 250 MG PO TABS: 250.0000 mg | ORAL_TABLET | Freq: Every day | ORAL | Status: DC

## 2014-05-22 MED ORDER — AZITHROMYCIN 250 MG PO TABS
500.0000 mg | ORAL_TABLET | Freq: Once | ORAL | Status: AC
  Administered 2014-05-22: 500 mg via ORAL
  Filled 2014-05-22: qty 2

## 2014-05-22 MED ORDER — OXYMETAZOLINE HCL 0.05 % NA SOLN
1.0000 | Freq: Once | NASAL | Status: AC
  Administered 2014-05-22: 1 via NASAL
  Filled 2014-05-22: qty 15

## 2014-05-22 NOTE — ED Notes (Signed)
Pt arrived to the Ed with a complaint of epistaxis.  Pt has had a couple of nose bleeds this year.  Pt states he believes he blows his nose too hard and that he rolls tissue and uses it in his nostril.  Pt bleeding is controlled in triage.  Pt states copious amount had come out prior to arrival.

## 2014-05-22 NOTE — ED Provider Notes (Signed)
CSN: [id number]     Arrival date & time 05/22/14  0018 History   First MD Initiated Contact with Patient 05/22/14 0139     Chief Complaint  Patient presents with  . Epistaxis     (Consider location/radiation/quality/duration/timing/severity/associated sxs/prior Treatment) HPI Comments: Patient presents tonight after nosebleed.  He states he's had 2 or 3 nosebleeds this year.  Usually resolve with pressure.  He is also complaining of some sinus pressure and congestion.  Is not seen his primary care physician nor a specialist for his recurrent nosebleeds.  Denies any fever, headache, weakness, dizziness, trauma to the nose.    Patient is a 73 y.o. male presenting with nosebleeds. The history is provided by the patient.  Epistaxis Location:  L nare Severity:  Mild Timing:  Intermittent Progression:  Resolved Chronicity:  Recurrent Context: recent infection   Relieved by:  Applying pressure Worsened by:  Nothing tried Ineffective treatments:  None tried Associated symptoms: congestion and sinus pain   Associated symptoms: no blood in oropharynx, no cough, no dizziness, no facial pain, no fever, no sneezing and no sore throat     Past Medical History  Diagnosis Date  . Cancer    Past Surgical History  Procedure Laterality Date  . Knee surgery    . Hernia repair    . Abdominal surgery     History reviewed. No pertinent family history. History  Substance Use Topics  . Smoking status: Former Smoker    Quit date: 06/18/1983  . Smokeless tobacco: Not on file  . Alcohol Use: No    Review of Systems  Unable to perform ROS Constitutional: Negative for fever.  HENT: Positive for congestion, nosebleeds and sinus pressure. Negative for facial swelling, sneezing and sore throat.   Eyes: Positive for redness.  Respiratory: Negative for cough.   Neurological: Negative for dizziness.  All other systems reviewed and are negative.     Allergies  Acyclovir and related and  Amphotericin b  Home Medications   Prior to Admission medications   Medication Sig Start Date End Date Taking? Authorizing Provider  acetaminophen (TYLENOL) 500 MG tablet Take 1,000 mg by mouth every 6 (six) hours as needed (For knee pain.).   Yes Historical Provider, MD  tamsulosin (FLOMAX) 0.4 MG CAPS capsule Take 0.8 mg by mouth at bedtime.   Yes Historical Provider, MD  azithromycin (ZITHROMAX) 250 MG tablet Take 1 tablet (250 mg total) by mouth daily. 05/22/14   Garald Balding, NP   BP 126/78 mmHg  Pulse 112  Temp(Src) 99.1 F (37.3 C) (Oral)  Resp 17  SpO2 96% Physical Exam  Constitutional: He is oriented to person, place, and time. He appears well-developed and well-nourished.  HENT:  Head: Normocephalic.  Nose: Mucosal edema present. No nasal septal hematoma. Left sinus exhibits maxillary sinus tenderness and frontal sinus tenderness.  Mouth/Throat: Oropharynx is clear and moist.  Small area of edema and dilated blood vessels anterior L nare  Eyes: Pupils are equal, round, and reactive to light.  Neck: Normal range of motion.  Cardiovascular: Normal rate.   Pulmonary/Chest: Effort normal.  Musculoskeletal: Normal range of motion.  Neurological: He is alert and oriented to person, place, and time.  Nursing note and vitals reviewed.   ED Course  Procedures (including critical care time) Labs Review Labs Reviewed - No data to display  Imaging Review No results found.   EKG Interpretation None     She has been started on azithromycin for his sinus  congestion is also been given Afrin with strict instructions to use 1 spray to each nostril twice a day for 3 days only.  He's also been given a referral to Dr. Wilburn Cornelia ENT for further evaluation MDM   Final diagnoses:  Epistaxis, recurrent  Sinus congestion         Garald Balding, NP 05/22/14 [place], MD 05/22/14 [phone]

## 2014-05-22 NOTE — Discharge Instructions (Signed)
You've been given Afrin nasal spray to help reduce the frequency of nosebleeds.  Please use 1 spray to each nostril twice a day for 3 days only.  Please make an appointment with Dr. Wilburn Cornelia for further evaluation of your sinusitis and nosebleeds.  Return at anytime that you cannot control the bleeding with pressure

## 2014-05-26 ENCOUNTER — Emergency Department (HOSPITAL_COMMUNITY)
Admission: EM | Admit: 2014-05-26 | Discharge: 2014-05-26 | Disposition: A | Discharge status: Home / Self Care | Payer: Medicare Other | Attending: Emergency Medicine | Admitting: Emergency Medicine

## 2014-05-26 ENCOUNTER — Encounter (HOSPITAL_COMMUNITY): Payer: Self-pay

## 2014-05-26 DIAGNOSIS — R05 Cough: Secondary | ICD-10-CM | POA: Diagnosis not present

## 2014-05-26 DIAGNOSIS — R0602 Shortness of breath: Secondary | ICD-10-CM | POA: Diagnosis not present

## 2014-05-26 DIAGNOSIS — Z792 Long term (current) use of antibiotics: Secondary | ICD-10-CM | POA: Diagnosis not present

## 2014-05-26 DIAGNOSIS — Z87891 Personal history of nicotine dependence: Secondary | ICD-10-CM | POA: Insufficient documentation

## 2014-05-26 DIAGNOSIS — R42 Dizziness and giddiness: Secondary | ICD-10-CM | POA: Insufficient documentation

## 2014-05-26 DIAGNOSIS — R040 Epistaxis: Secondary | ICD-10-CM

## 2014-05-26 DIAGNOSIS — Z859 Personal history of malignant neoplasm, unspecified: Secondary | ICD-10-CM | POA: Insufficient documentation

## 2014-05-26 DIAGNOSIS — R000 Tachycardia, unspecified: Secondary | ICD-10-CM | POA: Diagnosis not present

## 2014-05-26 LAB — BASIC METABOLIC PANEL
ANION GAP: 15 (ref 5–15)
BUN: 17 mg/dL (ref 6–23)
CALCIUM: 9.7 mg/dL (ref 8.4–10.5)
CO2: 22 meq/L (ref 19–32)
Chloride: 102 mEq/L (ref 96–112)
Creatinine, Ser: 0.96 mg/dL (ref 0.50–1.35)
GFR calc Af Amer: >90 mL/min (ref >90)
GFR, EST NON AFRICAN AMERICAN: 80 mL/min — AB (ref >90)
Glucose, Bld: 100 mg/dL — ABNORMAL HIGH (ref 70–99)
Potassium: 4.2 mEq/L (ref 3.7–5.3)
SODIUM: 139 meq/L (ref 137–147)

## 2014-05-26 LAB — URINALYSIS, ROUTINE W REFLEX MICROSCOPIC
Bilirubin Urine: NEGATIVE
Glucose, UA: NEGATIVE mg/dL
Hgb urine dipstick: NEGATIVE
KETONES UR: NEGATIVE mg/dL
Leukocytes, UA: NEGATIVE
NITRITE: NEGATIVE
PROTEIN: NEGATIVE mg/dL
Specific Gravity, Urine: 1.025 (ref 1.005–1.030)
Urobilinogen, UA: 0.2 mg/dL (ref 0.0–1.0)
pH: 5.5 (ref 5.0–8.0)

## 2014-05-26 LAB — CBC
HCT: 42.5 % (ref 39.0–52.0)
HEMOGLOBIN: 14.6 g/dL (ref 13.0–17.0)
MCH: 31 pg (ref 26.0–34.0)
MCHC: 34.4 g/dL (ref 30.0–36.0)
MCV: 90.2 fL (ref 78.0–100.0)
PLATELETS: 164 10*3/uL (ref 150–400)
RBC: 4.71 MIL/uL (ref 4.22–5.81)
RDW: 13.3 % (ref 11.5–15.5)
WBC: 4.9 10*3/uL (ref 4.0–10.5)

## 2014-05-26 MED ORDER — BACITRACIN ZINC 500 UNIT/GM EX OINT: TOPICAL_OINTMENT | CUTANEOUS | Status: DC

## 2014-05-26 MED ORDER — SODIUM CHLORIDE 0.9 % IV BOLUS (SEPSIS)
1000.0000 mL | INTRAVENOUS | Status: AC
Start: 2014-05-26 — End: 2014-05-26
  Administered 2014-05-26: 1000 mL via INTRAVENOUS

## 2014-05-26 NOTE — ED Notes (Signed)
Seen at [facility] long Sunday for nosebleed sent home and had another yesterday, today woke up coughing with another nose bleed and seen pmd, sent here for elevated hr may be related to dropping hgb.

## 2014-05-26 NOTE — ED Notes (Signed)
Pt will return home with family. Pt. Escorted via wheelchair by ED staff from ED. Pt is in stable condition upon d/c.

## 2014-05-26 NOTE — ED Provider Notes (Signed)
CSN: [id number]     Arrival date & time 05/26/14  1342 History   First MD Initiated Contact with Patient 05/26/14 1500     Chief Complaint  Patient presents with  . Tachycardia  . Epistaxis     (Consider location/radiation/quality/duration/timing/severity/associated sxs/prior Treatment) Patient is a 73 y.o. male presenting with nosebleeds. The history is provided by the patient.  Epistaxis Location:  L nare Severity:  Mild Duration: 5-10. Timing:  Sporadic Progression:  Unchanged Chronicity:  Recurrent Context comment:  URI Relieved by:  Applying pressure and vasoconstrictors Exacerbated by: coughing, blowing his nose. Ineffective treatments:  None tried Associated symptoms: congestion and cough   Associated symptoms: no fever and no headaches     Past Medical History  Diagnosis Date  . Cancer    Past Surgical History  Procedure Laterality Date  . Knee surgery    . Hernia repair    . Abdominal surgery     History reviewed. No pertinent family history. History  Substance Use Topics  . Smoking status: Former Smoker    Quit date: 06/18/1983  . Smokeless tobacco: Not on file  . Alcohol Use: No    Review of Systems  Constitutional: Negative for fever.  HENT: Positive for congestion and nosebleeds. Negative for drooling and rhinorrhea.   Eyes: Positive for discharge (resolved). Negative for pain.  Respiratory: Positive for cough and shortness of breath (occasional).   Cardiovascular: Negative for chest pain and leg swelling.  Gastrointestinal: Negative for nausea, vomiting, abdominal pain and diarrhea.  Genitourinary: Negative for dysuria and hematuria.  Musculoskeletal: Negative for gait problem and neck pain.  Skin: Negative for color change.  Neurological: Negative for numbness and headaches.  Hematological: Negative for adenopathy.  Psychiatric/Behavioral: Negative for behavioral problems.  All other systems reviewed and are negative.     Allergies   Acyclovir and related and Amphotericin b  Home Medications   Prior to Admission medications   Medication Sig Start Date End Date Taking? Authorizing Provider  acetaminophen (TYLENOL) 500 MG tablet Take 1,000 mg by mouth every 6 (six) hours as needed (For knee pain.).    Historical Provider, MD  azithromycin (ZITHROMAX) 250 MG tablet Take 1 tablet (250 mg total) by mouth daily. 05/22/14   Garald Balding, NP  tamsulosin (FLOMAX) 0.4 MG CAPS capsule Take 0.8 mg by mouth at bedtime.    Historical Provider, MD   BP 127/78 mmHg  Pulse 111  Temp(Src) 98.5 F (36.9 C) (Oral)  Resp 15  Ht 5\' 5"  (1.651 m)  Wt 180 lb (81.647 kg)  BMI 29.95 kg/m2  SpO2 96% Physical Exam  Constitutional: He is oriented to person, place, and time. He appears well-developed and well-nourished.  HENT:  Head: Normocephalic and atraumatic.  Right Ear: External ear normal.  Left Ear: External ear normal.  Nose: Nose normal.  Mouth/Throat: Oropharynx is clear and moist. No oropharyngeal exudate.  Normal-appearing nares  Bilaterally.  Normal appearing tympanic membranes bilaterally. Bilateral ear canals partially occluded with cerumen.   Eyes: Conjunctivae and EOM are normal. Pupils are equal, round, and reactive to light.  Neck: Normal range of motion. Neck supple.  Cardiovascular: Regular rhythm, normal heart sounds and intact distal pulses.  Exam reveals no gallop and no friction rub.   No murmur heard. [id number]  Pulmonary/Chest: Effort normal and breath sounds normal. No respiratory distress. He has no wheezes.  Abdominal: Soft. Bowel sounds are normal. He exhibits no distension. There is no tenderness. There is no rebound and  no guarding.  Musculoskeletal: Normal range of motion. He exhibits no edema or tenderness.  Neurological: He is alert and oriented to person, place, and time.  alert, oriented x3 speech: normal in context and clarity memory: intact grossly cranial nerves II-XII: intact motor strength:  full proximally and distally no involuntary movements or tremors sensation: intact to light touch diffusely  cerebellar: finger-to-nose intact gait: normal forwards and backwards  Skin: Skin is warm and dry.  Psychiatric: He has a normal mood and affect. His behavior is normal.  Nursing note and vitals reviewed.   ED Course  Procedures (including critical care time) Labs Review Labs Reviewed  BASIC METABOLIC PANEL - Abnormal; Notable for the following:    Glucose, Bld 100 (*)    GFR calc non Af Amer 80 (*)    All other components within normal limits  URINALYSIS, ROUTINE W REFLEX MICROSCOPIC - Abnormal; Notable for the following:    Color, Urine AMBER (*)    All other components within normal limits  CBC    Imaging Review No results found.   EKG Interpretation   Date/Time:  Thursday May 26 2014 15:34:50 EST Ventricular Rate:  92 PR Interval:  191 QRS Duration: 71 QT Interval:  368 QTC Calculation: 455 R Axis:   -15 Text Interpretation:  Age not entered, assumed to be  73 years old for  purpose of ECG interpretation Sinus rhythm Borderline left axis deviation  Abnormal R-wave progression, early transition No significant change since  last tracing Confirmed by Leyda Vanderwerf  MD, Michie ([id number]) on 05/26/2014  4:23:28 PM      MDM   Final diagnoses:  Epistaxis  Tachycardia    3:20 PM 73 y.o. male who presents with epistaxis and a fast heart rate. He states that he was seen in [place] 4 days ago for epistaxis that lasted approximately 20 minutes and was treated with conservative measures at the hospital. He was sent home on antibiotics to treat a URI. He states that he had some bleeding from the left nare yesterday morning which resolved with compression. He states that he woke up coughing around 4 AM this morning and again had some bleeding from the left nare which lasted 5-10 minutes. He states that he had some mild dizziness while ambulating at home this morning  but denies this now. He states that he went to see his PCP who referred him here for evaluation due to the fast heart rate. On my exam he has a heart rate in the low 100s although he denies any palpitations or feeling of fast heart rate. He is currently asymptomatic. No obvious source of bleeding on examination of his naris bilaterally. We'll get screening lab work, IV fluids, ecg, UA.   6:13 PM: I interpreted/reviewed the labs and/or imaging which were non-contributory.  Pt continues to appear well. I have discussed the diagnosis/risks/treatment options with the patient and family and believe the pt to be eligible for discharge home to follow-up with Dr. Wilburn Cornelia. We also discussed returning to the ED immediately if new or worsening sx occur. We discussed the sx which are most concerning (e.g., worsening nose bleeding) that necessitate immediate return. Medications administered to the patient during their visit and any new prescriptions provided to the patient are listed below.  Medications given during this visit Medications  sodium chloride 0.9 % bolus 1,000 mL (0 mLs Intravenous Stopped 05/26/14 1709)    New Prescriptions   BACITRACIN OINTMENT    Apply to each nare  twice daily.     Pamella Pert, MD 05/26/14 [phone]

## 2014-07-29 ENCOUNTER — Ambulatory Visit: Payer: Medicare Other | Admitting: Interventional Cardiology

## 2014-08-16 DIAGNOSIS — R040 Epistaxis: Secondary | ICD-10-CM | POA: Diagnosis not present

## 2014-08-19 ENCOUNTER — Ambulatory Visit (INDEPENDENT_AMBULATORY_CARE_PROVIDER_SITE_OTHER): Payer: Medicare Other | Admitting: Interventional Cardiology

## 2014-08-19 ENCOUNTER — Encounter: Payer: Self-pay | Admitting: Interventional Cardiology

## 2014-08-19 VITALS — BP 114/80 | HR 93 | Ht 65.0 in | Wt 182.8 lb

## 2014-08-19 DIAGNOSIS — C9200 Acute myeloblastic leukemia, not having achieved remission: Secondary | ICD-10-CM | POA: Insufficient documentation

## 2014-08-19 DIAGNOSIS — I471 Supraventricular tachycardia: Secondary | ICD-10-CM | POA: Diagnosis not present

## 2014-08-19 DIAGNOSIS — Z856 Personal history of leukemia: Secondary | ICD-10-CM | POA: Insufficient documentation

## 2014-08-19 DIAGNOSIS — R040 Epistaxis: Secondary | ICD-10-CM | POA: Diagnosis not present

## 2014-08-19 NOTE — Patient Instructions (Signed)
Your physician recommends that you continue on your current medications as directed. Please refer to the Current Medication list given to you today.  Your physician has recommended that you wear a holter monitor. Holter monitors are medical devices that record the heart's electrical activity. Doctors most often use these monitors to diagnose arrhythmias. Arrhythmias are problems with the speed or rhythm of the heartbeat. The monitor is a small, portable device. You can wear one while you do your normal daily activities. This is usually used to diagnose what is causing palpitations/syncope (passing out).(48 hour)  Your physician recommends that you schedule a follow-up appointment pending results.

## 2014-08-19 NOTE — Progress Notes (Signed)
Cardiology Office Note   Date:  08/19/2014   ID:  Kenneth Sanford, DOB Aug 22, 1940, MRN [id number]  PCP:  Kenneth Palau, MD  Cardiologist:   Kenneth Grooms, MD   No chief complaint on file.     History of Present Illness: Kenneth Sanford is a 74 y.o. male who presents for elevated heart rate and nose bleeds. He saw Dr. Lindwood Sanford and the doctor became concerned about a fast heart rate and referred the patient. The patient denies palpitations. He has no history of arrhythmia. There is no history of vascular disease. 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 One of his medications was likely an anthracycline. He denies peripheral edema, orthopnea, PND, and chest pain.    Past Medical History  Diagnosis Date  . Cancer     Past Surgical History  Procedure Laterality Date  . Knee surgery    . Hernia repair    . Abdominal surgery       Current Outpatient Prescriptions  Medication Sig Dispense Refill  . acetaminophen (TYLENOL) 500 MG tablet Take 1,000 mg by mouth every 6 (six) hours as needed (For knee pain.).    Marland Kitchen azithromycin (ZITHROMAX) 250 MG tablet Take 1 tablet (250 mg total) by mouth daily. 4 tablet 0  . bacitracin ointment Apply to each nare twice daily. 14 g 0  . tamsulosin (FLOMAX) 0.4 MG CAPS capsule Take 0.8 mg by mouth at bedtime.     No current facility-administered medications for this visit.    Allergies:   Acyclovir and related and Amphotericin b    Social History:  The patient  reports that he quit smoking about 31 years ago. He does not have any smokeless tobacco history on file. He reports that he does not drink alcohol or use illicit drugs.   Family History:  The patient's family history includes Hypertension in his mother.    ROS:  Please see the history of present illness.   Otherwise, review of systems are positive for arthritis in both knees prevent full mobility.   All other systems are reviewed and negative.     PHYSICAL EXAM: VS:  BP 114/80 mmHg  Pulse 93  Ht 5\' 5"  (1.651 m)  Wt 182 lb 12.8 oz (82.918 kg)  BMI 30.42 kg/m2 , BMI Body mass index is 30.42 kg/(m^2). GEN: Well nourished, well developed, in no acute distress HEENT: normal Neck: no JVD, carotid bruits, or masses Cardiac: RRR; no murmurs, rubs, or gallops,no edema  Respiratory:  clear to auscultation bilaterally, normal work of breathing GI: soft, nontender, nondistended, + BS MS: no deformity or atrophy Skin: warm and dry, no rash Neuro:  Strength and sensation are intact Psych: euthymic mood, full affect   EKG:  EKG is ordered today. The ekg ordered today demonstrates normal sinus rhythm at 93 bpm with nonspecific T-wave flattening. Moderate voltage.   Recent Labs: 05/26/2014: BUN 17; Creatinine 0.96; Hemoglobin 14.6; Platelets 164; Potassium 4.2; Sodium 139    Lipid Panel No results found for: CHOL, TRIG, HDL, CHOLHDL, VLDL, LDLCALC, LDLDIRECT    Wt Readings from Last 3 Encounters:  08/19/14 182 lb 12.8 oz (82.918 kg)  05/26/14 180 lb (81.647 kg)      Other studies Reviewed: Additional studies/ records that were reviewed today include: Reviewed all [place] records.. Review of the above records demonstrates: Identified that he had acute myelogenous leukemia that was treated at [facility].   ASSESSMENT AND PLAN:     Current medicines are reviewed at length with the patient today.  The patient does not have concerns regarding medicines.  The following changes have been made:  48 hour monitor to exclude arrhythmia  Labs/ tests ordered today include:   Orders Placed This Encounter  Procedures  . EKG 12-Lead  . Holter monitor - 48 hour     Disposition:   FU with Kenneth Sanford as needed  Signed, Kenneth Grooms, MD  08/19/2014 4:36 PM    [facility] [address], [place], [place]  [address] Phone: [phone]; Fax: [phone]

## 2014-08-24 ENCOUNTER — Encounter: Payer: Self-pay | Admitting: *Deleted

## 2014-08-24 ENCOUNTER — Encounter (INDEPENDENT_AMBULATORY_CARE_PROVIDER_SITE_OTHER): Payer: Medicare Other

## 2014-08-24 DIAGNOSIS — I471 Supraventricular tachycardia: Secondary | ICD-10-CM | POA: Diagnosis not present

## 2014-08-24 NOTE — Progress Notes (Signed)
Patient ID: Kenneth Sanford, male   DOB: [birth year], 74 y.o.   MRN: [id number] [employer] 48 hour holter monitor applied to patient.

## 2014-09-01 ENCOUNTER — Telehealth: Payer: Self-pay

## 2014-09-01 NOTE — Telephone Encounter (Signed)
Pt aware of holter monitor results -Normal Pt verbalized understanding

## 2014-10-20 DIAGNOSIS — Z1211 Encounter for screening for malignant neoplasm of colon: Secondary | ICD-10-CM | POA: Diagnosis not present

## 2014-10-20 DIAGNOSIS — Z86010 Personal history of colonic polyps: Secondary | ICD-10-CM | POA: Diagnosis not present

## 2014-10-20 DIAGNOSIS — K5730 Diverticulosis of large intestine without perforation or abscess without bleeding: Secondary | ICD-10-CM | POA: Diagnosis not present

## 2014-10-24 DIAGNOSIS — Z86010 Personal history of colonic polyps: Secondary | ICD-10-CM | POA: Diagnosis not present

## 2014-10-24 DIAGNOSIS — Z1211 Encounter for screening for malignant neoplasm of colon: Secondary | ICD-10-CM | POA: Diagnosis not present

## 2014-10-24 DIAGNOSIS — K5730 Diverticulosis of large intestine without perforation or abscess without bleeding: Secondary | ICD-10-CM | POA: Diagnosis not present

## 2014-10-27 ENCOUNTER — Telehealth: Payer: Self-pay | Admitting: *Deleted

## 2014-10-27 NOTE — Telephone Encounter (Signed)
Voice mail received requesting return call for answers to questions. Kenneth Sanford "would like to know if Kenneth Sanford received records from Kenneth Sanford.  Colonoscopy performed Monday, 10-24-2014.  Platelets a little low, no polyps, cancer or other alarms.  I asked information be sent to Kenneth Sanford to see if I need to be seen by Kenneth Sanford." This nurse will notify Kenneth Sanford and staff.  Patient was last seen 03-10-2006 by Kenneth Flesher PA-C with no further follow up.  Informed him of this.  He asked that we call or mail instructions or appointment at [phone].  Address confirmed.

## 2014-11-01 NOTE — Telephone Encounter (Signed)
Have not received information-- please request from dr [facility] office  Thank you!

## 2014-11-01 NOTE — Telephone Encounter (Signed)
Routed this request for records to Dr.Mann.

## 2014-11-05 ENCOUNTER — Emergency Department (HOSPITAL_COMMUNITY)
Admission: EM | Admit: 2014-11-05 | Discharge: 2014-11-05 | Disposition: A | Discharge status: Home / Self Care | Payer: Medicare Other | Attending: Emergency Medicine | Admitting: Emergency Medicine

## 2014-11-05 ENCOUNTER — Encounter (HOSPITAL_COMMUNITY): Payer: Self-pay | Admitting: Emergency Medicine

## 2014-11-05 DIAGNOSIS — R55 Syncope and collapse: Secondary | ICD-10-CM | POA: Diagnosis not present

## 2014-11-05 DIAGNOSIS — Z859 Personal history of malignant neoplasm, unspecified: Secondary | ICD-10-CM | POA: Insufficient documentation

## 2014-11-05 DIAGNOSIS — I951 Orthostatic hypotension: Secondary | ICD-10-CM | POA: Diagnosis not present

## 2014-11-05 DIAGNOSIS — Z87891 Personal history of nicotine dependence: Secondary | ICD-10-CM | POA: Insufficient documentation

## 2014-11-05 DIAGNOSIS — Z79899 Other long term (current) drug therapy: Secondary | ICD-10-CM | POA: Insufficient documentation

## 2014-11-05 DIAGNOSIS — Z792 Long term (current) use of antibiotics: Secondary | ICD-10-CM | POA: Insufficient documentation

## 2014-11-05 DIAGNOSIS — R404 Transient alteration of awareness: Secondary | ICD-10-CM | POA: Diagnosis not present

## 2014-11-05 LAB — BASIC METABOLIC PANEL
Anion gap: 11 (ref 5–15)
BUN: 21 mg/dL — AB (ref 6–20)
CHLORIDE: 107 mmol/L (ref 101–111)
CO2: 23 mmol/L (ref 22–32)
Calcium: 9 mg/dL (ref 8.9–10.3)
Creatinine, Ser: 1.39 mg/dL — ABNORMAL HIGH (ref 0.61–1.24)
GFR calc Af Amer: 56 mL/min — ABNORMAL LOW (ref >60)
GFR calc non Af Amer: 48 mL/min — ABNORMAL LOW (ref >60)
Glucose, Bld: 191 mg/dL — ABNORMAL HIGH (ref 65–99)
Potassium: 3.9 mmol/L (ref 3.5–5.1)
SODIUM: 141 mmol/L (ref 135–145)

## 2014-11-05 LAB — CBC WITH DIFFERENTIAL/PLATELET
Basophils Absolute: 0 10*3/uL (ref 0.0–0.1)
Basophils Relative: 0 % (ref 0–1)
Eosinophils Absolute: 0.1 10*3/uL (ref 0.0–0.7)
Eosinophils Relative: 3 % (ref 0–5)
HCT: 41.8 % (ref 39.0–52.0)
Hemoglobin: 14.1 g/dL (ref 13.0–17.0)
LYMPHS PCT: 28 % (ref 12–46)
Lymphs Abs: 1.1 10*3/uL (ref 0.7–4.0)
MCH: 31.1 pg (ref 26.0–34.0)
MCHC: 33.7 g/dL (ref 30.0–36.0)
MCV: 92.1 fL (ref 78.0–100.0)
MONOS PCT: 12 % (ref 3–12)
Monocytes Absolute: 0.5 10*3/uL (ref 0.1–1.0)
NEUTROS ABS: 2.3 10*3/uL (ref 1.7–7.7)
Neutrophils Relative %: 57 % (ref 43–77)
PLATELETS: 132 10*3/uL — AB (ref 150–400)
RBC: 4.54 MIL/uL (ref 4.22–5.81)
RDW: 14.1 % (ref 11.5–15.5)
WBC: 4 10*3/uL (ref 4.0–10.5)

## 2014-11-05 LAB — I-STAT TROPONIN, ED: TROPONIN I, POC: 0 ng/mL (ref 0.00–0.08)

## 2014-11-05 MED ORDER — SODIUM CHLORIDE 0.9 % IV BOLUS (SEPSIS)
1000.0000 mL | Freq: Once | INTRAVENOUS | Status: AC
  Administered 2014-11-05: 1000 mL via INTRAVENOUS

## 2014-11-05 MED ORDER — SODIUM CHLORIDE 0.9 % IV BOLUS (SEPSIS)
1000.0000 mL | Freq: Once | INTRAVENOUS | Status: AC
Start: 2014-11-05 — End: 2014-11-05
  Administered 2014-11-05: 1000 mL via INTRAVENOUS

## 2014-11-05 NOTE — Discharge Instructions (Signed)
Near-Syncope Near-syncope (commonly known as near fainting) is sudden weakness, dizziness, or feeling like you might pass out. During an episode of near-syncope, you may also develop pale skin, have tunnel vision, or feel sick to your stomach (nauseous). Near-syncope may occur when getting up after sitting or while standing for a long time. It is caused by a sudden decrease in blood flow to the brain. This decrease can result from various causes or triggers, most of which are not serious. However, because near-syncope can sometimes be a sign of something serious, a medical evaluation is required. The specific cause is often not determined. HOME CARE INSTRUCTIONS  Monitor your condition for any changes. The following actions may help to alleviate any discomfort you are experiencing:  Have someone stay with you until you feel stable.  Lie down right away and prop your feet up if you start feeling like you might faint. Breathe deeply and steadily. Wait until all the symptoms have passed. Most of these episodes last only a few minutes. You may feel tired for several hours.   Drink enough fluids to keep your urine clear or pale yellow.   If you are taking blood pressure or heart medicine, get up slowly when seated or lying down. Take several minutes to sit and then stand. This can reduce dizziness.  Follow up with your health care provider as directed. SEEK IMMEDIATE MEDICAL CARE IF:   You have a severe headache.   You have unusual pain in the chest, abdomen, or back.   You are bleeding from the mouth or rectum, or you have black or tarry stool.   You have an irregular or very fast heartbeat.   You have repeated fainting or have seizure-like jerking during an episode.   You faint when sitting or lying down.   You have confusion.   You have difficulty walking.   You have severe weakness.   You have vision problems.  MAKE SURE YOU:   Understand these instructions.  Will  watch your condition.  Will get help right away if you are not doing well or get worse. Document Released: 06/03/2005 Document Revised: 06/08/2013 Document Reviewed: 11/06/2012 ExitCare Patient Information 2015 [employer]. This information is not intended to replace advice given to you by your health care provider. Make sure you discuss any questions you have with your health care provider.  

## 2014-11-05 NOTE — ED Notes (Signed)
Pt alert, oriented, and ambulatory upon DC. He was advised to follow up with PCP for kidney function re-check.

## 2014-11-05 NOTE — ED Provider Notes (Signed)
74 year old male, presents to the hospital after having a near syncopal episode, states that he did not eat anything today until he was at [employer], he feels extremely stressed over the amount of work he is to do the last couple of weeks, he became diaphoretic, fatigued, generally weak, he did not pass out, does not have chest pain or palpitations. On exam the patient had initially had mild hypotension, tachycardia, clear heart and lung sounds, soft abdomen, no peripheral edema, normal neurologic exam. Labs and EKG have been ordered to evaluate for potential sources though I suspect the patient has an element of dehydration and will need IV fluids and recheck. Anticipate discharge if no worrisome findings or results.  Medical screening examination/treatment/procedure(s) were conducted as a shared visit with non-physician practitioner(s) and myself.  I personally evaluated the patient during the encounter.  Clinical Impression:   Final diagnoses:  Near syncope  Orthostatic hypotension         Noemi Chapel, MD 11/06/14 [phone]

## 2014-11-05 NOTE — ED Notes (Signed)
Bed: WA06 Expected date: 11/05/14 Expected time: 5:51 PM Means of arrival: Ambulance Comments: Near syncopal episode

## 2014-11-05 NOTE — ED Provider Notes (Signed)
CSN: [id number]     Arrival date & time 11/05/14  1749 History   First MD Initiated Contact with Patient 11/05/14 1753     Chief Complaint  Patient presents with  . Near Syncope     (Consider location/radiation/quality/duration/timing/severity/associated sxs/prior Treatment) HPI Comments: Patient presents to the emergency department with chief complaints of near syncopal episode. 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 States that they were taking all the widows to [facility] today for a charity event.  Patient states that he felt very lightheaded and like he was going to pass out. He denies any chest pain, SOB, n/v/d, or abdominal pain.  No melena or hematochezia.  He denies any symptoms currently.  States that he feels well now.  States that he has seen Dr. Tamala Julian of cardiology in the past and wore a heart monitor with no significant findings.  States that this has happened before and that normally his BP runs low.    The history is provided by the patient. No language interpreter was used.    Past Medical History  Diagnosis Date  . Cancer    Past Surgical History  Procedure Laterality Date  . Knee surgery    . Hernia repair    . Abdominal surgery     Family History  Problem Relation Age of Onset  . Hypertension Mother    History  Substance Use Topics  . Smoking status: Former Smoker    Quit date: 06/18/1983  . Smokeless tobacco: Not on file  . Alcohol Use: No    Review of Systems  Constitutional: Negative for fever and chills.  Respiratory: Negative for shortness of breath.   Cardiovascular: Negative for chest pain.  Gastrointestinal: Negative for nausea, vomiting, diarrhea and constipation.  Genitourinary: Negative for dysuria.  Neurological: Positive for syncope and light-headedness.  All other systems reviewed and are negative.     Allergies  Acyclovir and related; Amphotericin b; and Aspirin  Home Medications    Prior to Admission medications   Medication Sig Start Date End Date Taking? Authorizing Provider  acetaminophen (TYLENOL) 500 MG tablet Take 1,000 mg by mouth every 6 (six) hours as needed (For knee pain.).   Yes Historical Provider, MD  tamsulosin (FLOMAX) 0.4 MG CAPS capsule Take 0.8 mg by mouth at bedtime.   Yes Historical Provider, MD  azithromycin (ZITHROMAX) 250 MG tablet Take 1 tablet (250 mg total) by mouth daily. Patient not taking: Reported on 11/05/2014 05/22/14   Junius Creamer, NP  bacitracin ointment Apply to each nare twice daily. Patient not taking: Reported on 11/05/2014 05/26/14   Pamella Pert, MD  GAVILYTE-G 236 G solution  10/21/14   Historical Provider, MD   BP 93/42 mmHg  Pulse 94  Temp(Src) 97.7 F (36.5 C) (Oral)  Resp 18  SpO2 95% Physical Exam  Constitutional: He is oriented to person, place, and time. He appears well-developed and well-nourished.  HENT:  Head: Normocephalic and atraumatic.  Eyes: Conjunctivae and EOM are normal. Pupils are equal, round, and reactive to light. Right eye exhibits no discharge. Left eye exhibits no discharge. No scleral icterus.  Neck: Normal range of motion. Neck supple. No JVD present.  Cardiovascular: Normal rate, regular rhythm and normal heart sounds.  Exam reveals no gallop and no friction rub.   No murmur heard. Pulmonary/Chest: Effort normal and breath sounds normal. No respiratory distress. He has no wheezes. He has no rales. He exhibits no tenderness.  Abdominal: Soft. He exhibits no distension and no mass. There is no tenderness. There is no rebound and no guarding.  No focal abdominal tenderness, no RLQ tenderness or pain at McBurney's point, no RUQ tenderness or Murphy's sign, no left-sided abdominal tenderness, no fluid wave, or signs of peritonitis   Musculoskeletal: Normal range of motion. He exhibits no edema or tenderness.  Neurological: He is alert and oriented to person, place, and time.  Skin: Skin is warm  and dry.  Psychiatric: He has a normal mood and affect. His behavior is normal. Judgment and thought content normal.  Nursing note and vitals reviewed.   ED Course  Procedures (including critical care time) Results for orders placed or performed during the hospital encounter of 11/05/14  CBC with Differential/Platelet  Result Value Ref Range   WBC 4.0 4.0 - 10.5 K/uL   RBC 4.54 4.22 - 5.81 MIL/uL   Hemoglobin 14.1 13.0 - 17.0 g/dL   HCT 41.8 39.0 - 52.0 %   MCV 92.1 78.0 - 100.0 fL   MCH 31.1 26.0 - 34.0 pg   MCHC 33.7 30.0 - 36.0 g/dL   RDW 14.1 11.5 - 15.5 %   Platelets 132 (L) 150 - 400 K/uL   Neutrophils Relative % 57 43 - 77 %   Neutro Abs 2.3 1.7 - 7.7 K/uL   Lymphocytes Relative 28 12 - 46 %   Lymphs Abs 1.1 0.7 - 4.0 K/uL   Monocytes Relative 12 3 - 12 %   Monocytes Absolute 0.5 0.1 - 1.0 K/uL   Eosinophils Relative 3 0 - 5 %   Eosinophils Absolute 0.1 0.0 - 0.7 K/uL   Basophils Relative 0 0 - 1 %   Basophils Absolute 0.0 0.0 - 0.1 K/uL  Basic metabolic panel  Result Value Ref Range   Sodium 141 135 - 145 mmol/L   Potassium 3.9 3.5 - 5.1 mmol/L   Chloride 107 101 - 111 mmol/L   CO2 23 22 - 32 mmol/L   Glucose, Bld 191 (H) 65 - 99 mg/dL   BUN 21 (H) 6 - 20 mg/dL   Creatinine, Ser 1.39 (H) 0.61 - 1.24 mg/dL   Calcium 9.0 8.9 - 10.3 mg/dL   GFR calc non Af Amer 48 (L) >60 mL/min   GFR calc Af Amer 56 (L) >60 mL/min   Anion gap 11 5 - 15  I-Stat Troponin, ED (not at [facility])  Result Value Ref Range   Troponin i, poc 0.00 0.00 - 0.08 ng/mL   Comment 3           No results found.    EKG Interpretation None      MDM   Final diagnoses:  Near syncope  Orthostatic hypotension    Patient with near syncopal episode at the [place], suspect vasovagal. Patient became lightheaded, felt flushed, diaphoretic. Improved after he rested. CBG by EMS was unremarkable. He had not eaten anything prior to the episode at [facility].    8:17 PM Patient reassessed.   Feels well.  No sign of infection, sepsis, or blood loss.  Suspect that the patient is a little dry with increased Cr to 1.39.  Patient has been improving with fluids.    Seen by and discussed with Dr. Sabra Heck, who agrees that symptoms are likely 2/2 vasovagal.   Filed Vitals:   11/05/14 2044  BP: 145/94  Pulse: 101  Temp:   Resp: 18   Repeat orthostatic VS are reassuring.  Patient feels  well.  Will discharge to home with PCP follow-up.    Montine Circle, PA-C 11/05/14 2243  Noemi Chapel, MD 11/06/14 [phone]

## 2014-11-05 NOTE — ED Notes (Signed)
PER EMS- pt picked up from [facility] c/o near syncope.  Pt reports he was sitting at the table and felt like he was about to pass out.  Initial BP on EMS arrival was 80-palpated and pt appeared cool and clammy. PTA  20g IV placed LFA. BP increased to 108/74. Alert and oriented per baseline.  Pt reports he wore a heart monitor for several months with no findings . Pt has hx of near syncope.  Wife reports pt's baseline BP is low.

## 2014-11-05 NOTE — ED Notes (Signed)
MD at bedside. 

## 2015-04-06 DIAGNOSIS — R972 Elevated prostate specific antigen [PSA]: Secondary | ICD-10-CM | POA: Diagnosis not present

## 2015-04-13 DIAGNOSIS — N401 Enlarged prostate with lower urinary tract symptoms: Secondary | ICD-10-CM | POA: Diagnosis not present

## 2015-04-13 DIAGNOSIS — N138 Other obstructive and reflux uropathy: Secondary | ICD-10-CM | POA: Diagnosis not present

## 2015-04-13 DIAGNOSIS — R972 Elevated prostate specific antigen [PSA]: Secondary | ICD-10-CM | POA: Diagnosis not present

## 2015-05-08 DIAGNOSIS — J40 Bronchitis, not specified as acute or chronic: Secondary | ICD-10-CM | POA: Diagnosis not present

## 2015-10-12 DIAGNOSIS — N401 Enlarged prostate with lower urinary tract symptoms: Secondary | ICD-10-CM | POA: Diagnosis not present

## 2015-10-12 DIAGNOSIS — R972 Elevated prostate specific antigen [PSA]: Secondary | ICD-10-CM | POA: Diagnosis not present

## 2016-04-11 DIAGNOSIS — R972 Elevated prostate specific antigen [PSA]: Secondary | ICD-10-CM | POA: Diagnosis not present

## 2016-04-11 DIAGNOSIS — N401 Enlarged prostate with lower urinary tract symptoms: Secondary | ICD-10-CM | POA: Diagnosis not present

## 2016-04-11 DIAGNOSIS — R3915 Urgency of urination: Secondary | ICD-10-CM | POA: Diagnosis not present

## 2017-04-09 DIAGNOSIS — R972 Elevated prostate specific antigen [PSA]: Secondary | ICD-10-CM | POA: Diagnosis not present

## 2017-04-15 DIAGNOSIS — N401 Enlarged prostate with lower urinary tract symptoms: Secondary | ICD-10-CM | POA: Diagnosis not present

## 2017-04-15 DIAGNOSIS — R351 Nocturia: Secondary | ICD-10-CM | POA: Diagnosis not present

## 2017-04-15 DIAGNOSIS — R972 Elevated prostate specific antigen [PSA]: Secondary | ICD-10-CM | POA: Diagnosis not present

## 2017-09-12 DIAGNOSIS — Z856 Personal history of leukemia: Secondary | ICD-10-CM | POA: Diagnosis not present

## 2017-09-12 DIAGNOSIS — N401 Enlarged prostate with lower urinary tract symptoms: Secondary | ICD-10-CM | POA: Diagnosis not present

## 2017-09-12 DIAGNOSIS — M130 Polyarthritis, unspecified: Secondary | ICD-10-CM | POA: Diagnosis not present

## 2017-10-15 DIAGNOSIS — R972 Elevated prostate specific antigen [PSA]: Secondary | ICD-10-CM | POA: Diagnosis not present

## 2017-11-14 DIAGNOSIS — M130 Polyarthritis, unspecified: Secondary | ICD-10-CM | POA: Diagnosis not present

## 2017-11-14 DIAGNOSIS — N401 Enlarged prostate with lower urinary tract symptoms: Secondary | ICD-10-CM | POA: Diagnosis not present

## 2017-11-14 DIAGNOSIS — Z856 Personal history of leukemia: Secondary | ICD-10-CM | POA: Diagnosis not present

## 2018-01-15 DIAGNOSIS — R972 Elevated prostate specific antigen [PSA]: Secondary | ICD-10-CM | POA: Diagnosis not present

## 2018-01-22 DIAGNOSIS — R351 Nocturia: Secondary | ICD-10-CM | POA: Diagnosis not present

## 2018-01-22 DIAGNOSIS — R972 Elevated prostate specific antigen [PSA]: Secondary | ICD-10-CM | POA: Diagnosis not present

## 2018-01-22 DIAGNOSIS — N401 Enlarged prostate with lower urinary tract symptoms: Secondary | ICD-10-CM | POA: Diagnosis not present

## 2018-02-13 DIAGNOSIS — Z6832 Body mass index (BMI) 32.0-32.9, adult: Secondary | ICD-10-CM | POA: Diagnosis not present

## 2018-02-13 DIAGNOSIS — M130 Polyarthritis, unspecified: Secondary | ICD-10-CM | POA: Diagnosis not present

## 2018-02-13 DIAGNOSIS — J301 Allergic rhinitis due to pollen: Secondary | ICD-10-CM | POA: Diagnosis not present

## 2018-04-01 ENCOUNTER — Other Ambulatory Visit: Payer: Self-pay

## 2018-04-01 ENCOUNTER — Encounter (HOSPITAL_COMMUNITY): Payer: Self-pay | Admitting: *Deleted

## 2018-04-01 ENCOUNTER — Emergency Department (HOSPITAL_COMMUNITY)
Admission: EM | Admit: 2018-04-01 | Discharge: 2018-04-01 | Disposition: A | Discharge status: Home / Self Care | Payer: Medicare Other | Attending: Emergency Medicine | Admitting: Emergency Medicine

## 2018-04-01 DIAGNOSIS — Z79899 Other long term (current) drug therapy: Secondary | ICD-10-CM | POA: Diagnosis not present

## 2018-04-01 DIAGNOSIS — R339 Retention of urine, unspecified: Secondary | ICD-10-CM | POA: Diagnosis not present

## 2018-04-01 DIAGNOSIS — Z87891 Personal history of nicotine dependence: Secondary | ICD-10-CM | POA: Diagnosis not present

## 2018-04-01 LAB — URINALYSIS, ROUTINE W REFLEX MICROSCOPIC
Bacteria, UA: NONE SEEN
Bilirubin Urine: NEGATIVE
Glucose, UA: NEGATIVE mg/dL
Ketones, ur: NEGATIVE mg/dL
Leukocytes, UA: NEGATIVE
Nitrite: NEGATIVE
PROTEIN: NEGATIVE mg/dL
Specific Gravity, Urine: 1.012 (ref 1.005–1.030)
pH: 6 (ref 5.0–8.0)

## 2018-04-01 NOTE — ED Provider Notes (Signed)
TIME SEEN: 3:35 AM  CHIEF COMPLAINT: Urinary retention  HPI: Patient is a 77 year old male who has history of BPH on tamulosin with previous history of urinary retention who presents the emergency department urinary retention.  Last able to urinate around midnight.  Patient is extremely uncomfortable.  No fevers, nausea, vomiting or diarrhea.  No back pain no focal neurologic deficits.  ROS: See HPI Constitutional: no fever  Eyes: no drainage  ENT: no runny nose   Cardiovascular:  no chest pain  Resp: no SOB  GI: no vomiting GU: no dysuria Integumentary: no rash  Allergy: no hives  Musculoskeletal: no leg swelling  Neurological: no slurred speech ROS otherwise negative  PAST MEDICAL HISTORY/PAST SURGICAL HISTORY:  Past Medical History:  Diagnosis Date  . Cancer [facility])     MEDICATIONS:  Prior to Admission medications   Medication Sig Start Date End Date Taking? Authorizing Provider  acetaminophen (TYLENOL) 500 MG tablet Take 1,000 mg by mouth every 6 (six) hours as needed (For knee pain.).    [provider]  azithromycin (ZITHROMAX) 250 MG tablet Take 1 tablet (250 mg total) by mouth daily. Patient not taking: Reported on 11/05/2014 05/22/14   Junius Creamer, NP  bacitracin ointment Apply to each nare twice daily. Patient not taking: Reported on 11/05/2014 05/26/14   Pamella Pert, MD  GAVILYTE-G 236 G solution  10/21/14   [provider]  tamsulosin (FLOMAX) 0.4 MG CAPS capsule Take 0.8 mg by mouth at bedtime.    [provider]    ALLERGIES:  Allergies  Allergen Reactions  . Acyclovir And Related Other (See Comments)    Reaction unknown  . Amphotericin B Other (See Comments)    Reaction unknown  . 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    SOCIAL HISTORY:  Social History   Tobacco Use  . Smoking status: Former Smoker    Last attempt to quit: 06/18/1983    Years since quitting: 34.8  Substance Use Topics  . Alcohol use: No     FAMILY HISTORY: Family History  Problem Relation Age of Onset  . Hypertension Mother     EXAM: BP (!) 140/108 (BP Location: Right Arm)   Pulse (!) 131   Temp 97.6 F (36.4 C) (Oral)   Resp (!) 22   Ht 5\' 5"  (1.651 m)   Wt 81.6 kg   SpO2 98%   BMI 29.95 kg/m  CONSTITUTIONAL: Alert and oriented and responds appropriately to questions.  Elderly, appears extremely uncomfortable HEAD: Normocephalic EYES: Conjunctivae clear, pupils appear equal, EOMI ENT: normal nose; moist mucous membranes NECK: Supple, no meningismus, no nuchal rigidity, no LAD  CARD: Regular and tachycardic; S1 and S2 appreciated; no murmurs, no clicks, no rubs, no gallops RESP: Normal chest excursion without splinting or tachypnea; breath sounds clear and equal bilaterally; no wheezes, no rhonchi, no rales, no hypoxia or respiratory distress, speaking full sentences ABD/GI: Normal bowel sounds; distended lower abdomen; soft, non-tender, no rebound, no guarding, no peritoneal signs, no hepatosplenomegaly BACK:  The back appears normal and is non-tender to palpation, there is no CVA tenderness EXT: Normal ROM in all joints; non-tender to palpation; no edema; normal capillary refill; no cyanosis, no calf tenderness or swelling    SKIN: Normal color for age and race; warm; no rash NEURO: Moves all extremities equally, able to ambulate without difficulty PSYCH: The patient's mood and manner are appropriate. Grooming and personal hygiene are appropriate.  MEDICAL  DECISION MAKING: Patient here with urinary retention likely from BPH.  Bladder scan showed over 300 mL.  Foley catheter placed with good drainage and he reports feeling much better.  He has drained over 500 mL.  Urine shows no sign of infection.  He has had urinary retention before and will follow up with [facility].  Discussed return precautions.   At this time, I do not feel there is any life-threatening condition present. I have reviewed and  discussed all results (EKG, imaging, lab, urine as appropriate) and exam findings with patient/family. I have reviewed nursing notes and appropriate previous records.  I feel the patient is safe to be discharged home without further emergent workup and can continue workup as an outpatient as needed. Discussed usual and customary return precautions. Patient/family verbalize understanding and are comfortable with this plan.  Outpatient follow-up has been provided if needed. All questions have been answered.      Brantlee Penn, Delice Bison, DO 04/01/18 [phone]

## 2018-04-01 NOTE — ED Triage Notes (Signed)
Pt c/o urinary retention that began approx MN

## 2018-04-01 NOTE — ED Notes (Signed)
Bed: WA03 Expected date:  Expected time:  Means of arrival:  Comments: 

## 2018-04-07 DIAGNOSIS — N401 Enlarged prostate with lower urinary tract symptoms: Secondary | ICD-10-CM | POA: Diagnosis not present

## 2018-04-07 DIAGNOSIS — R972 Elevated prostate specific antigen [PSA]: Secondary | ICD-10-CM | POA: Diagnosis not present

## 2018-04-07 DIAGNOSIS — R338 Other retention of urine: Secondary | ICD-10-CM | POA: Diagnosis not present

## 2018-04-21 DIAGNOSIS — N401 Enlarged prostate with lower urinary tract symptoms: Secondary | ICD-10-CM | POA: Diagnosis not present

## 2018-04-21 DIAGNOSIS — R338 Other retention of urine: Secondary | ICD-10-CM | POA: Diagnosis not present

## 2018-05-01 ENCOUNTER — Other Ambulatory Visit: Payer: Self-pay

## 2018-05-20 DIAGNOSIS — Z0000 Encounter for general adult medical examination without abnormal findings: Secondary | ICD-10-CM | POA: Diagnosis not present

## 2018-06-15 DIAGNOSIS — Z6832 Body mass index (BMI) 32.0-32.9, adult: Secondary | ICD-10-CM | POA: Diagnosis not present

## 2018-06-15 DIAGNOSIS — N401 Enlarged prostate with lower urinary tract symptoms: Secondary | ICD-10-CM | POA: Diagnosis not present

## 2018-06-15 DIAGNOSIS — J301 Allergic rhinitis due to pollen: Secondary | ICD-10-CM | POA: Diagnosis not present

## 2018-06-15 DIAGNOSIS — E785 Hyperlipidemia, unspecified: Secondary | ICD-10-CM | POA: Diagnosis not present

## 2018-06-15 DIAGNOSIS — M130 Polyarthritis, unspecified: Secondary | ICD-10-CM | POA: Diagnosis not present

## 2018-06-18 DIAGNOSIS — Z856 Personal history of leukemia: Secondary | ICD-10-CM | POA: Diagnosis not present

## 2018-06-18 DIAGNOSIS — M130 Polyarthritis, unspecified: Secondary | ICD-10-CM | POA: Diagnosis not present

## 2018-06-18 DIAGNOSIS — Z6831 Body mass index (BMI) 31.0-31.9, adult: Secondary | ICD-10-CM | POA: Diagnosis not present

## 2019-01-28 DIAGNOSIS — R351 Nocturia: Secondary | ICD-10-CM | POA: Diagnosis not present

## 2019-01-28 DIAGNOSIS — N401 Enlarged prostate with lower urinary tract symptoms: Secondary | ICD-10-CM | POA: Diagnosis not present

## 2019-01-28 DIAGNOSIS — R972 Elevated prostate specific antigen [PSA]: Secondary | ICD-10-CM | POA: Diagnosis not present

## 2019-01-28 DIAGNOSIS — R3912 Poor urinary stream: Secondary | ICD-10-CM | POA: Diagnosis not present

## 2020-02-17 DIAGNOSIS — R351 Nocturia: Secondary | ICD-10-CM | POA: Diagnosis not present

## 2020-02-17 DIAGNOSIS — N401 Enlarged prostate with lower urinary tract symptoms: Secondary | ICD-10-CM | POA: Diagnosis not present

## 2020-02-17 DIAGNOSIS — R3912 Poor urinary stream: Secondary | ICD-10-CM | POA: Diagnosis not present

## 2021-03-15 DIAGNOSIS — N401 Enlarged prostate with lower urinary tract symptoms: Secondary | ICD-10-CM | POA: Diagnosis not present

## 2021-03-15 DIAGNOSIS — R351 Nocturia: Secondary | ICD-10-CM | POA: Diagnosis not present

## 2021-08-29 DIAGNOSIS — R351 Nocturia: Secondary | ICD-10-CM | POA: Diagnosis not present

## 2021-08-29 DIAGNOSIS — N3000 Acute cystitis without hematuria: Secondary | ICD-10-CM | POA: Diagnosis not present

## 2021-08-29 DIAGNOSIS — R310 Gross hematuria: Secondary | ICD-10-CM | POA: Diagnosis not present

## 2021-08-29 DIAGNOSIS — N401 Enlarged prostate with lower urinary tract symptoms: Secondary | ICD-10-CM | POA: Diagnosis not present

## 2021-09-02 ENCOUNTER — Other Ambulatory Visit: Payer: Self-pay

## 2021-09-02 ENCOUNTER — Emergency Department (HOSPITAL_COMMUNITY)
Admission: EM | Admit: 2021-09-02 | Discharge: 2021-09-02 | Disposition: A | Discharge status: Home / Self Care | Payer: Medicare Other | Attending: Emergency Medicine | Admitting: Emergency Medicine

## 2021-09-02 ENCOUNTER — Encounter (HOSPITAL_COMMUNITY): Payer: Self-pay | Admitting: Emergency Medicine

## 2021-09-02 DIAGNOSIS — R1030 Lower abdominal pain, unspecified: Secondary | ICD-10-CM | POA: Insufficient documentation

## 2021-09-02 DIAGNOSIS — R319 Hematuria, unspecified: Secondary | ICD-10-CM | POA: Diagnosis not present

## 2021-09-02 DIAGNOSIS — R339 Retention of urine, unspecified: Secondary | ICD-10-CM | POA: Diagnosis not present

## 2021-09-02 LAB — I-STAT CHEM 8, ED
BUN: 18 mg/dL (ref 8–23)
Calcium, Ion: 1.13 mmol/L — ABNORMAL LOW (ref 1.15–1.40)
Chloride: 104 mmol/L (ref 98–111)
Creatinine, Ser: 1.5 mg/dL — ABNORMAL HIGH (ref 0.61–1.24)
Glucose, Bld: 117 mg/dL — ABNORMAL HIGH (ref 70–99)
HCT: 43 % (ref 39.0–52.0)
Hemoglobin: 14.6 g/dL (ref 13.0–17.0)
Potassium: 3.7 mmol/L (ref 3.5–5.1)
Sodium: 139 mmol/L (ref 135–145)
TCO2: 24 mmol/L (ref 22–32)

## 2021-09-02 LAB — URINALYSIS, MICROSCOPIC (REFLEX)

## 2021-09-02 LAB — URINALYSIS, ROUTINE W REFLEX MICROSCOPIC
Bilirubin Urine: NEGATIVE
Glucose, UA: NEGATIVE mg/dL
Ketones, ur: NEGATIVE mg/dL
Nitrite: POSITIVE — AB
Protein, ur: NEGATIVE mg/dL
Specific Gravity, Urine: <1.005 — ABNORMAL LOW (ref 1.005–1.030)
pH: 6.5 (ref 5.0–8.0)

## 2021-09-02 NOTE — ED Provider Triage Note (Signed)
Emergency Medicine Provider Triage Evaluation Note ? ?Kenneth Sanford , a 81 y.o. male  was evaluated in triage.  Pt complains of urinary retention. The patient is unsure of last urination, but reports he has been dribbling. Last normal urination 1 hour ago. ? ?Review of Systems  ?Positive:  ?Negative:  ? ?Physical Exam  ?BP 125/88 (BP Location: Left Arm)   Pulse (!) 148   Temp 98.1 ?F (36.7 ?C) (Oral)   Resp 16   SpO2 98%  ?Gen:   Awake, no distress   ?Resp:  Normal effort  ?MSK:   Moves extremities without difficulty  ?Other:   ? ?Medical Decision Making  ?Medically screening exam initiated at 4:03 PM.  Appropriate orders placed.  Kenneth Sanford was informed that the remainder of the evaluation will be completed by another provider, this initial triage assessment does not replace that evaluation, and the importance of remaining in the ED until their evaluation is complete. ? ?Suspect bladder spasms. Bladder scanner and urinalysis ordered. The patient is being roomed now.  ?  ?Sherrell Puller, PA-C ?09/02/21 1605 ? ?

## 2021-09-02 NOTE — ED Provider Notes (Signed)
?[facility] DEPT ?Provider Note ? ? ?CSN: [id number] ?Arrival date & time: 09/02/21  1551 ? ?  ? ?History ? ?Chief Complaint  ?Patient presents with  ? Urinary Retention  ? ? ?Kenneth Sanford is a 81 y.o. male. ? ?Patient has a history of an enlarged prostate.  He complains of inability to urinate today ? ?The history is provided by the patient and medical records. No language interpreter was used.  ?Abdominal Pain ?Pain location:  Suprapubic ?Pain quality: aching   ?Pain severity:  Mild ?Onset quality:  Sudden ?Timing:  Constant ?Progression:  Worsening ?Chronicity:  New ?Context: not alcohol use   ?Relieved by:  Nothing ?Worsened by:  Nothing ?Associated symptoms: no chest pain, no cough, no diarrhea, no fatigue and no hematuria   ? ?  ? ?Home Medications ?Prior to Admission medications   ?Medication Sig Start Date End Date Taking? Authorizing Provider  ?acetaminophen (TYLENOL) 500 MG tablet Take 1,000 mg by mouth every 6 (six) hours as needed (For knee pain.).    [provider]  ?azithromycin (ZITHROMAX) 250 MG tablet Take 1 tablet (250 mg total) by mouth daily. ?Patient not taking: Reported on 11/05/2014 05/22/14   Junius Creamer, NP  ?bacitracin ointment Apply to each nare twice daily. ?Patient not taking: Reported on 11/05/2014 05/26/14   Pamella Pert, MD  ?Jaclynn Major 236 G solution  10/21/14   [provider]  ?tamsulosin (FLOMAX) 0.4 MG CAPS capsule Take 0.8 mg by mouth at bedtime.    [provider]  ?   ? ?Allergies    ?Acyclovir and related, Amphotericin b, and Aspirin   ? ?Review of Systems   ?Review of Systems  ?Constitutional:  Negative for appetite change and fatigue.  ?HENT:  Negative for congestion, ear discharge and sinus pressure.   ?Eyes:  Negative for discharge.  ?Respiratory:  Negative for cough.   ?Cardiovascular:  Negative for chest pain.  ?Gastrointestinal:  Positive for abdominal pain. Negative for diarrhea.  ?Genitourinary:  Negative  for frequency and hematuria.  ?     Urinary retention  ?Musculoskeletal:  Negative for back pain.  ?Skin:  Negative for rash.  ?Neurological:  Negative for seizures and headaches.  ?Psychiatric/Behavioral:  Negative for hallucinations.   ? ?Physical Exam ?Updated Vital Signs ?BP (!) 120/94   Pulse (!) 101   Temp 98.1 ?F (36.7 ?C) (Oral)   Resp 16   SpO2 95%  ?Physical Exam ?Vitals and nursing note reviewed.  ?Constitutional:   ?   Appearance: He is well-developed.  ?HENT:  ?   Head: Normocephalic.  ?   Nose: Nose normal.  ?Eyes:  ?   General: No scleral icterus. ?   Conjunctiva/sclera: Conjunctivae normal.  ?Neck:  ?   Thyroid: No thyromegaly.  ?Cardiovascular:  ?   Rate and Rhythm: Normal rate and regular rhythm.  ?   Heart sounds: No murmur heard. ?  No friction rub. No gallop.  ?Pulmonary:  ?   Breath sounds: No stridor. No wheezing or rales.  ?Chest:  ?   Chest wall: No tenderness.  ?Abdominal:  ?   General: There is distension.  ?   Tenderness: There is abdominal tenderness. There is no rebound.  ?Musculoskeletal:     ?   General: Normal range of motion.  ?   Cervical back: Neck supple.  ?Lymphadenopathy:  ?   Cervical: No cervical adenopathy.  ?Skin: ?   Findings: No erythema or rash.  ?Neurological:  ?  Mental Status: He is alert and oriented to person, place, and time.  ?   Motor: No abnormal muscle tone.  ?   Coordination: Coordination normal.  ?Psychiatric:     ?   Behavior: Behavior normal.  ? ? ?ED Results / Procedures / Treatments   ?Labs ?(all labs ordered are listed, but only abnormal results are displayed) ?Labs Reviewed  ?URINALYSIS, ROUTINE W REFLEX MICROSCOPIC - Abnormal; Notable for the following components:  ?    Result Value  ? Specific Gravity, Urine <1.005 (*)   ? Hgb urine dipstick LARGE (*)   ? Nitrite POSITIVE (*)   ? Leukocytes,Ua MODERATE (*)   ? All other components within normal limits  ?URINALYSIS, MICROSCOPIC (REFLEX) - Abnormal; Notable for the following components:  ?  Bacteria, UA FEW (*)   ? All other components within normal limits  ?I-STAT CHEM 8, ED - Abnormal; Notable for the following components:  ? Creatinine, Ser 1.50 (*)   ? Glucose, Bld 117 (*)   ? Calcium, Ion 1.13 (*)   ? All other components within normal limits  ?URINE CULTURE  ? ? ?EKG ?None ? ?Radiology ?No results found. ? ?Procedures ?Procedures  ? ? ?Medications Ordered in ED ?Medications - No data to display ? ?ED Course/ Medical Decision Making/ A&P ?  ?                        ?Medical Decision Making ?This patient presents to the ED for concern of inability to urinate, this involves an extensive number of treatment options, and is a complaint that carries with it a high risk of complications and morbidity.  The differential diagnosis includes large prostate causing obstruction ? ? ?Co morbidities that complicate the patient evaluation ? ?History of large prostate ? ? ?Additional history obtained: ? ?Additional history obtained from family ?External records from outside source obtained and reviewed including hospital record ? ? ?Lab Tests: ? ?I Ordered, and personally interpreted labs.  The pertinent results include: Chemistries which were unremarkable and urinalysis that showed RBCs 20-50 and some white cells.  This became cultured ? ? ?Imaging Studies ordered: ? ?No x-rays ? ?Cardiac Monitoring: / EKG: ? ?The patient was maintained on a cardiac monitor.  I personally viewed and interpreted the cardiac monitored which showed an underlying rhythm of: Normal sinus rhythm ? ? ?Consultations Obtained: ? ?No consult ? ?Problem List / ED Course / Critical interventions / Medication management ? ?Urinary retention ?no medicines ordered but patient had a Foley placed and that relieved his pain ? ?Social Determinants of Health: ? ?None ? ? ?Test / Admission - Considered: ? ? ? ?Urinary retention.  Foley has been placed.  Patient does have blood in his urine.  We will culture urine and he will follow-up with urology  this week ? ? ? ? ? ? ? ?Final Clinical Impression(s) / ED Diagnoses ?Final diagnoses:  ?Urinary retention  ? ? ?Rx / DC Orders ?ED Discharge Orders   ? ? None  ? ?  ? ? ?  ?Milton Ferguson, MD ?09/03/21 1418 ? ?

## 2021-09-02 NOTE — ED Triage Notes (Signed)
Patient states urinary retention. States he has been dribbling urine and unknown last time he had normal output. ?

## 2021-09-02 NOTE — ED Notes (Signed)
Patient extensively educated on the use of urinary catheter and how to clean and change bag. Pt denies any further questions. Pt understands DC instructions.  ?

## 2021-09-02 NOTE — Discharge Instructions (Signed)
Drink plenty of fluids and follow-up with your urologist as scheduled this week.  Return if any problems ?

## 2021-09-04 LAB — URINE CULTURE

## 2021-09-05 DIAGNOSIS — R310 Gross hematuria: Secondary | ICD-10-CM | POA: Diagnosis not present

## 2021-09-05 DIAGNOSIS — N281 Cyst of kidney, acquired: Secondary | ICD-10-CM | POA: Diagnosis not present

## 2021-09-05 DIAGNOSIS — K5730 Diverticulosis of large intestine without perforation or abscess without bleeding: Secondary | ICD-10-CM | POA: Diagnosis not present

## 2021-09-06 DIAGNOSIS — N401 Enlarged prostate with lower urinary tract symptoms: Secondary | ICD-10-CM | POA: Diagnosis not present

## 2021-09-06 DIAGNOSIS — R338 Other retention of urine: Secondary | ICD-10-CM | POA: Diagnosis not present

## 2021-09-06 DIAGNOSIS — R310 Gross hematuria: Secondary | ICD-10-CM | POA: Diagnosis not present

## 2021-09-07 DIAGNOSIS — R3912 Poor urinary stream: Secondary | ICD-10-CM | POA: Diagnosis not present

## 2021-09-07 DIAGNOSIS — N401 Enlarged prostate with lower urinary tract symptoms: Secondary | ICD-10-CM | POA: Diagnosis not present

## 2021-09-08 ENCOUNTER — Encounter (HOSPITAL_COMMUNITY): Payer: Self-pay

## 2021-09-08 ENCOUNTER — Emergency Department (HOSPITAL_COMMUNITY)
Admission: EM | Admit: 2021-09-08 | Discharge: 2021-09-08 | Disposition: A | Discharge status: Home / Self Care | Payer: Medicare Other | Attending: Emergency Medicine | Admitting: Emergency Medicine

## 2021-09-08 ENCOUNTER — Other Ambulatory Visit: Payer: Self-pay

## 2021-09-08 DIAGNOSIS — Z859 Personal history of malignant neoplasm, unspecified: Secondary | ICD-10-CM | POA: Diagnosis not present

## 2021-09-08 DIAGNOSIS — R339 Retention of urine, unspecified: Secondary | ICD-10-CM | POA: Insufficient documentation

## 2021-09-08 DIAGNOSIS — T83098A Other mechanical complication of other indwelling urethral catheter, initial encounter: Secondary | ICD-10-CM | POA: Insufficient documentation

## 2021-09-08 DIAGNOSIS — R140 Abdominal distension (gaseous): Secondary | ICD-10-CM | POA: Insufficient documentation

## 2021-09-08 LAB — CBC WITH DIFFERENTIAL/PLATELET
Abs Immature Granulocytes: 0.01 10*3/uL (ref 0.00–0.07)
Basophils Absolute: 0 10*3/uL (ref 0.0–0.1)
Basophils Relative: 0 %
Eosinophils Absolute: 0.1 10*3/uL (ref 0.0–0.5)
Eosinophils Relative: 2 %
HCT: 41.1 % (ref 39.0–52.0)
Hemoglobin: 13.8 g/dL (ref 13.0–17.0)
Immature Granulocytes: 0 %
Lymphocytes Relative: 27 %
Lymphs Abs: 0.8 10*3/uL (ref 0.7–4.0)
MCH: 30.9 pg (ref 26.0–34.0)
MCHC: 33.6 g/dL (ref 30.0–36.0)
MCV: 92.2 fL (ref 80.0–100.0)
Monocytes Absolute: 0.4 10*3/uL (ref 0.1–1.0)
Monocytes Relative: 13 %
Neutro Abs: 1.8 10*3/uL (ref 1.7–7.7)
Neutrophils Relative %: 58 %
Platelets: 179 10*3/uL (ref 150–400)
RBC: 4.46 MIL/uL (ref 4.22–5.81)
RDW: 13.8 % (ref 11.5–15.5)
WBC: 3.1 10*3/uL — ABNORMAL LOW (ref 4.0–10.5)
nRBC: 0 % (ref 0.0–0.2)

## 2021-09-08 LAB — URINALYSIS, MICROSCOPIC (REFLEX): RBC / HPF: >50 RBC/hpf (ref 0–5)

## 2021-09-08 LAB — URINALYSIS, ROUTINE W REFLEX MICROSCOPIC
Bilirubin Urine: NEGATIVE
Glucose, UA: NEGATIVE mg/dL
Ketones, ur: NEGATIVE mg/dL
Nitrite: NEGATIVE
Specific Gravity, Urine: >1.03 — ABNORMAL HIGH (ref 1.005–1.030)
pH: 6 (ref 5.0–8.0)

## 2021-09-08 LAB — BASIC METABOLIC PANEL
Anion gap: 11 (ref 5–15)
BUN: 26 mg/dL — ABNORMAL HIGH (ref 8–23)
CO2: 19 mmol/L — ABNORMAL LOW (ref 22–32)
Calcium: 9 mg/dL (ref 8.9–10.3)
Chloride: 105 mmol/L (ref 98–111)
Creatinine, Ser: 1.77 mg/dL — ABNORMAL HIGH (ref 0.61–1.24)
GFR, Estimated: 38 mL/min — ABNORMAL LOW (ref >60)
Glucose, Bld: 116 mg/dL — ABNORMAL HIGH (ref 70–99)
Potassium: 3.9 mmol/L (ref 3.5–5.1)
Sodium: 135 mmol/L (ref 135–145)

## 2021-09-08 NOTE — ED Triage Notes (Signed)
Patient has not been able to empty his bladder. He said Sunday he had a catheter. Thursday had a CT scan, they took the catheter out. He found out he has a swollen prostate. Went home without catheter. Then today cannot pee.  ?

## 2021-09-08 NOTE — ED Provider Notes (Signed)
?[facility] DEPT ?Provider Note ? ? ?CSN: [id number] ?Arrival date & time: 09/08/21  2006 ? ?  ? ?History ? ?Chief Complaint  ?Patient presents with  ? Urinary Retention  ? ? ?Kenneth Sanford is a 81 y.o. male. ? ?81 year old male presents today for evaluation of urinary retention.  Patient was recently in the emergency room for similar symptoms and was given a Foley bag.  He followed up with urology on Thursday and had CT imaging done, cystoscopy and his Foley bag was removed.  He states Friday he started having difficulty urinating then as of today he was unable to void.  He complains of abdominal fullness and pressure otherwise he denies any complaints.  He reports compliance with Bactrim he was prescribed for potential UTI last week.  He was just prescribed Flomax by urology on Thursday which she reports compliance with.  Denies fever, flank pain, or other complaints. ? ?The history is provided by the patient. No language interpreter was used.  ? ?  ? ?Home Medications ?Prior to Admission medications   ?Medication Sig Start Date End Date Taking? Authorizing Provider  ?acetaminophen (TYLENOL) 500 MG tablet Take 1,000 mg by mouth every 6 (six) hours as needed (For knee pain.).    [provider]  ?azithromycin (ZITHROMAX) 250 MG tablet Take 1 tablet (250 mg total) by mouth daily. ?Patient not taking: Reported on 11/05/2014 05/22/14   Junius Creamer, NP  ?bacitracin ointment Apply to each nare twice daily. ?Patient not taking: Reported on 11/05/2014 05/26/14   Pamella Pert, MD  ?Jaclynn Major 236 G solution  10/21/14   [provider]  ?tamsulosin (FLOMAX) 0.4 MG CAPS capsule Take 0.8 mg by mouth at bedtime.    [provider]  ?   ? ?Allergies    ?Acyclovir and related, Amphotericin b, and Aspirin   ? ?Review of Systems   ?Review of Systems  ?Constitutional:  Negative for chills and fever.  ?Gastrointestinal:  Positive for abdominal distention and abdominal pain  (Suprapubic pressure). Negative for nausea and vomiting.  ?Genitourinary:  Positive for difficulty urinating. Negative for dysuria, flank pain, penile swelling, scrotal swelling and testicular pain.  ?All other systems reviewed and are negative. ? ?Physical Exam ?Updated Vital Signs ?BP (!) 150/100 (BP Location: Left Arm)   Pulse (!) 122   Temp 98.1 ?F (36.7 ?C)   Resp 20   SpO2 97%  ?Physical Exam ?Vitals and nursing note reviewed.  ?Constitutional:   ?   General: He is not in acute distress. ?   Appearance: Normal appearance. He is not ill-appearing.  ?HENT:  ?   Head: Normocephalic and atraumatic.  ?   Nose: Nose normal.  ?Eyes:  ?   General: No scleral icterus. ?   Extraocular Movements: Extraocular movements intact.  ?   Conjunctiva/sclera: Conjunctivae normal.  ?Cardiovascular:  ?   Rate and Rhythm: Normal rate and regular rhythm.  ?   Pulses: Normal pulses.  ?   Heart sounds: Normal heart sounds.  ?Pulmonary:  ?   Effort: Pulmonary effort is normal. No respiratory distress.  ?   Breath sounds: Normal breath sounds. No wheezing or rales.  ?Abdominal:  ?   General: There is distension.  ?   Tenderness: There is no abdominal tenderness. There is no right CVA tenderness, left CVA tenderness or guarding.  ?Musculoskeletal:     ?   General: Normal range of motion.  ?   Cervical back: Normal range of motion.  ?  Skin: ?   General: Skin is warm and dry.  ?Neurological:  ?   General: No focal deficit present.  ?   Mental Status: He is alert. Mental status is at baseline.  ? ? ?ED Results / Procedures / Treatments   ?Labs ?(all labs ordered are listed, but only abnormal results are displayed) ?Labs Reviewed  ?URINALYSIS, ROUTINE W REFLEX MICROSCOPIC - Abnormal; Notable for the following components:  ?    Result Value  ? APPearance HAZY (*)   ? Specific Gravity, Urine >1.030 (*)   ? Hgb urine dipstick LARGE (*)   ? Protein, ur TRACE (*)   ? Leukocytes,Ua TRACE (*)   ? All other components within normal limits  ?CBC  WITH DIFFERENTIAL/PLATELET - Abnormal; Notable for the following components:  ? WBC 3.1 (*)   ? All other components within normal limits  ?BASIC METABOLIC PANEL - Abnormal; Notable for the following components:  ? CO2 19 (*)   ? Glucose, Bld 116 (*)   ? BUN 26 (*)   ? Creatinine, Ser 1.77 (*)   ? GFR, Estimated 38 (*)   ? All other components within normal limits  ?URINALYSIS, MICROSCOPIC (REFLEX) - Abnormal; Notable for the following components:  ? Bacteria, UA RARE (*)   ? All other components within normal limits  ? ? ?EKG ?None ? ?Radiology ?No results found. ? ?Procedures ?Procedures  ? ? ?Medications Ordered in ED ?Medications - No data to display ? ?ED Course/ Medical Decision Making/ A&P ?  ?                        ?Medical Decision Making ?Amount and/or Complexity of Data Reviewed ?Labs: ordered. ? ? ?Medical Decision Making / ED Course ? ? ?This patient presents to the ED for concern of urinary retention, this involves an extensive number of treatment options, and is a complaint that carries with it a high risk of complications and morbidity.  The differential diagnosis includes outlet obstruction secondary to BPH ? ?MDM: ?82 year old male presents today for evaluation of urinary retention.  Patient was just recently evaluated in the emergency room for similar symptoms last week and was discharged with Foley bag in place and follow-up with urology.  Patient followed up with urology and had his Foley removed after cystoscopy.  Patient states he was having difficulty urinating Friday but since this morning has not been able to void at all.  He mentions that his urology did discuss TURP procedure with him in the near future.  He is established with urology and will plan to follow-up.  Foley catheter placed today with resolution of his symptoms.  He has been on Bactrim since his last emergency room visit for UTI.  Urine improved today without signs of UTI.  CBC with mild leukopenia at 3.1 however without left  shift.  BMP with mild renal insufficiency with creatinine 1.77 likely secondary to outlet obstruction.  Discussed importance of follow-up with urologist.  Patient is appropriate for discharge.  Discharged in stable condition.  Patient voices understanding and is in agreement with plan.  Patient has Flomax that he will continue to take. ? ? ?Additional history obtained: ?-Additional history obtained from bedside ?-External records from outside source obtained and reviewed including: Chart review including previous notes, labs, imaging, consultation notes ? ? ?Lab Tests: ?-I ordered, reviewed, and interpreted labs.   ?The pertinent results include:   ?Labs Reviewed  ?URINALYSIS, ROUTINE W REFLEX MICROSCOPIC -  Abnormal; Notable for the following components:  ?    Result Value  ? APPearance HAZY (*)   ? Specific Gravity, Urine >1.030 (*)   ? Hgb urine dipstick LARGE (*)   ? Protein, ur TRACE (*)   ? Leukocytes,Ua TRACE (*)   ? All other components within normal limits  ?CBC WITH DIFFERENTIAL/PLATELET - Abnormal; Notable for the following components:  ? WBC 3.1 (*)   ? All other components within normal limits  ?BASIC METABOLIC PANEL - Abnormal; Notable for the following components:  ? CO2 19 (*)   ? Glucose, Bld 116 (*)   ? BUN 26 (*)   ? Creatinine, Ser 1.77 (*)   ? GFR, Estimated 38 (*)   ? All other components within normal limits  ?URINALYSIS, MICROSCOPIC (REFLEX) - Abnormal; Notable for the following components:  ? Bacteria, UA RARE (*)   ? All other components within normal limits  ?  ? ? ?EKG ? EKG Interpretation ? ?Date/Time:    ?Ventricular Rate:    ?PR Interval:    ?QRS Duration:   ?QT Interval:    ?QTC Calculation:   ?R Axis:     ?Text Interpretation:   ?  ? ?  ? ? ? ?Medicines ordered and prescription drug management: ?No orders of the defined types were placed in this encounter. ?  ?-I have reviewed the patients home medicines and have made adjustments as needed ? ? ?Reevaluation: ?After the interventions  noted above, I reevaluated the patient and found that they have :resolved ?Abdominal distention resolved following Foley placement. ? ?Co morbidities that complicate the patient evaluation ? ?Past Medical His

## 2021-09-08 NOTE — ED Notes (Signed)
Bladder scan greater than 472m. ?

## 2021-09-08 NOTE — ED Provider Triage Note (Signed)
Emergency Medicine Provider Triage Evaluation Note ? ?Elenor Quinones , a 81 y.o. male  was evaluated in triage.  Pt complains of urinary retention since yesterday.  Patient was evaluated in the emergency room last week and had a Foley placed.  This was removed Thursday after he had a CT scan done as well as a cystoscopy and was told he had enlarged prostate.  He states he has been voiding very little since yesterday.  Has abdominal pressure.  Denies fever or other complaints. ? ?Review of Systems  ?Positive: As above ?Negative: As above ? ?Physical Exam  ?BP (!) 150/100 (BP Location: Left Arm)   Pulse (!) 122   Temp 98.1 ?F (36.7 ?C)   Resp 20   SpO2 97%  ?Gen:   Awake, no distress   ?Resp:  Normal effort  ?MSK:   Moves extremities without difficulty  ?Other:  Abdominal distention ? ?Medical Decision Making  ?Medically screening exam initiated at 8:34 PM.  Appropriate orders placed.  Zakk E Bohnet was informed that the remainder of the evaluation will be completed by another provider, this initial triage assessment does not replace that evaluation, and the importance of remaining in the ED until their evaluation is complete. ? ? ?  ?Evlyn Courier, PA-C ?09/08/21 2035 ? ?

## 2021-09-08 NOTE — Discharge Instructions (Addendum)
He had a Foley placed due to urinary retention today.  He had resolution of your symptoms after Foley catheter placement.  This is likely secondary to your enlarged prostate.  Recommend you follow-up with urologist and discussed further management.  If you have any worsening symptoms please return to the emergency room. ?

## 2021-09-11 DIAGNOSIS — R338 Other retention of urine: Secondary | ICD-10-CM | POA: Diagnosis not present

## 2021-09-13 DIAGNOSIS — R338 Other retention of urine: Secondary | ICD-10-CM | POA: Diagnosis not present

## 2021-09-14 ENCOUNTER — Other Ambulatory Visit: Payer: Self-pay | Admitting: Ophthalmology

## 2021-09-14 DIAGNOSIS — R338 Other retention of urine: Secondary | ICD-10-CM | POA: Diagnosis not present

## 2021-09-14 DIAGNOSIS — N411 Chronic prostatitis: Secondary | ICD-10-CM | POA: Diagnosis not present

## 2021-09-14 DIAGNOSIS — R310 Gross hematuria: Secondary | ICD-10-CM | POA: Diagnosis not present

## 2021-09-14 DIAGNOSIS — N401 Enlarged prostate with lower urinary tract symptoms: Secondary | ICD-10-CM | POA: Diagnosis not present

## 2021-09-18 ENCOUNTER — Emergency Department (HOSPITAL_COMMUNITY): Payer: Medicare Other

## 2021-09-18 ENCOUNTER — Encounter (HOSPITAL_COMMUNITY): Payer: Self-pay | Admitting: Emergency Medicine

## 2021-09-18 ENCOUNTER — Emergency Department (HOSPITAL_COMMUNITY)
Admission: EM | Admit: 2021-09-18 | Discharge: 2021-09-18 | Disposition: A | Discharge status: Home / Self Care | Payer: Medicare Other | Attending: Emergency Medicine | Admitting: Emergency Medicine

## 2021-09-18 DIAGNOSIS — R42 Dizziness and giddiness: Secondary | ICD-10-CM | POA: Diagnosis not present

## 2021-09-18 DIAGNOSIS — R339 Retention of urine, unspecified: Secondary | ICD-10-CM | POA: Diagnosis not present

## 2021-09-18 DIAGNOSIS — I959 Hypotension, unspecified: Secondary | ICD-10-CM | POA: Diagnosis not present

## 2021-09-18 DIAGNOSIS — R944 Abnormal results of kidney function studies: Secondary | ICD-10-CM | POA: Insufficient documentation

## 2021-09-18 DIAGNOSIS — R000 Tachycardia, unspecified: Secondary | ICD-10-CM | POA: Insufficient documentation

## 2021-09-18 DIAGNOSIS — E860 Dehydration: Secondary | ICD-10-CM | POA: Diagnosis not present

## 2021-09-18 DIAGNOSIS — R310 Gross hematuria: Secondary | ICD-10-CM | POA: Insufficient documentation

## 2021-09-18 DIAGNOSIS — R531 Weakness: Secondary | ICD-10-CM | POA: Diagnosis not present

## 2021-09-18 LAB — CBC
HCT: 40 % (ref 39.0–52.0)
Hemoglobin: 13.7 g/dL (ref 13.0–17.0)
MCH: 31.5 pg (ref 26.0–34.0)
MCHC: 34.3 g/dL (ref 30.0–36.0)
MCV: 92 fL (ref 80.0–100.0)
Platelets: 122 10*3/uL — ABNORMAL LOW (ref 150–400)
RBC: 4.35 MIL/uL (ref 4.22–5.81)
RDW: 13.9 % (ref 11.5–15.5)
WBC: 3.6 10*3/uL — ABNORMAL LOW (ref 4.0–10.5)
nRBC: 0 % (ref 0.0–0.2)

## 2021-09-18 LAB — URINALYSIS, ROUTINE W REFLEX MICROSCOPIC: RBC / HPF: >50 RBC/hpf — ABNORMAL HIGH (ref 0–5)

## 2021-09-18 LAB — BASIC METABOLIC PANEL
Anion gap: 6 (ref 5–15)
BUN: 23 mg/dL (ref 8–23)
CO2: 21 mmol/L — ABNORMAL LOW (ref 22–32)
Calcium: 8.5 mg/dL — ABNORMAL LOW (ref 8.9–10.3)
Chloride: 107 mmol/L (ref 98–111)
Creatinine, Ser: 1.61 mg/dL — ABNORMAL HIGH (ref 0.61–1.24)
GFR, Estimated: 43 mL/min — ABNORMAL LOW (ref >60)
Glucose, Bld: 152 mg/dL — ABNORMAL HIGH (ref 70–99)
Potassium: 4.7 mmol/L (ref 3.5–5.1)
Sodium: 134 mmol/L — ABNORMAL LOW (ref 135–145)

## 2021-09-18 LAB — LACTIC ACID, PLASMA: Lactic Acid, Venous: 1.7 mmol/L (ref 0.5–1.9)

## 2021-09-18 LAB — CBG MONITORING, ED: Glucose-Capillary: 147 mg/dL — ABNORMAL HIGH (ref 70–99)

## 2021-09-18 MED ORDER — SODIUM CHLORIDE 0.9 % IV BOLUS
1000.0000 mL | Freq: Once | INTRAVENOUS | Status: AC
  Administered 2021-09-18: 1000 mL via INTRAVENOUS

## 2021-09-18 NOTE — ED Notes (Signed)
ED Provider at bedside. 

## 2021-09-18 NOTE — ED Notes (Signed)
Patient transported to X-ray 

## 2021-09-18 NOTE — ED Triage Notes (Signed)
Per pt, states he has been weak, dizzy and light headed since before his surgery-states he had surgery on Friday to remove catheter and something to his prostate, he can't remember-states he is normally hypotensive ?

## 2021-09-18 NOTE — ED Notes (Signed)
Pt requested to wait in lobby for ride.  Pt wheeled to lobby.  ?

## 2021-09-18 NOTE — ED Provider Notes (Signed)
?[facility] DEPT ?Provider Note ? ? ?CSN: [id number] ?Arrival date & time: 09/18/21  1001 ? ?  ? ?History ? ?Chief Complaint  ?Patient presents with  ? Dizziness  ? ? ?Kenneth Sanford is a 81 y.o. male. ? ?HPI ?Patient presents from urology clinic due to concern for hypotension.  Patient had TURP performed for days ago.  Today had planned voiding trial, was found to be orthostatic.  No syncope, no fall, no pain.  Patient had his catheter removed, was sent here for evaluation due to persistent hypotension. ?Patient denies abdominal pain, chest pain, dyspnea. ?  ? ?Home Medications ?Prior to Admission medications   ?Medication Sig Start Date End Date Taking? Authorizing Provider  ?acetaminophen (TYLENOL) 500 MG tablet Take 1,000 mg by mouth every 6 (six) hours as needed (For knee pain.).    [provider]  ?azithromycin (ZITHROMAX) 250 MG tablet Take 1 tablet (250 mg total) by mouth daily. ?Patient not taking: Reported on 11/05/2014 05/22/14   Junius Creamer, NP  ?bacitracin ointment Apply to each nare twice daily. ?Patient not taking: Reported on 11/05/2014 05/26/14   Pamella Pert, MD  ?Jaclynn Major 236 G solution  10/21/14   [provider]  ?tamsulosin (FLOMAX) 0.4 MG CAPS capsule Take 0.8 mg by mouth at bedtime.    [provider]  ?   ? ?Allergies    ?Acyclovir and related, Amphotericin b, and Aspirin   ? ?Review of Systems   ?Review of Systems  ?Constitutional:   ?     Per HPI, otherwise negative  ?HENT:    ?     Per HPI, otherwise negative  ?Respiratory:    ?     Per HPI, otherwise negative  ?Cardiovascular:   ?     Per HPI, otherwise negative  ?Gastrointestinal:  Negative for vomiting.  ?Endocrine:  ?     Negative aside from HPI  ?Genitourinary:   ?     Neg aside from HPI   ?Musculoskeletal:   ?     Per HPI, otherwise negative  ?Skin: Negative.   ?Neurological:  Negative for syncope.  ? ?Physical Exam ?Updated Vital Signs ?BP 113/77   Pulse 95   Temp 98.7 ?F  (37.1 ?C) (Oral)   Resp 15   SpO2 97%  ?Physical Exam ?Vitals and nursing note reviewed.  ?Constitutional:   ?   General: He is not in acute distress. ?   Appearance: He is well-developed.  ?HENT:  ?   Head: Normocephalic and atraumatic.  ?Eyes:  ?   Conjunctiva/sclera: Conjunctivae normal.  ?Cardiovascular:  ?   Rate and Rhythm: Normal rate and regular rhythm.  ?Pulmonary:  ?   Effort: Pulmonary effort is normal. No respiratory distress.  ?   Breath sounds: No stridor.  ?Abdominal:  ?   General: There is no distension.  ?Genitourinary: ?   Comments: Gross blood in urine ?Skin: ?   General: Skin is warm and dry.  ?Neurological:  ?   Mental Status: He is alert and oriented to person, place, and time.  ? ? ?ED Results / Procedures / Treatments   ?Labs ?(all labs ordered are listed, but only abnormal results are displayed) ?Labs Reviewed  ?BASIC METABOLIC PANEL - Abnormal; Notable for the following components:  ?    Result Value  ? Sodium 134 (*)   ? CO2 21 (*)   ? Glucose, Bld 152 (*)   ? Creatinine, Ser 1.61 (*)   ?  Calcium 8.5 (*)   ? GFR, Estimated 43 (*)   ? All other components within normal limits  ?CBC - Abnormal; Notable for the following components:  ? WBC 3.6 (*)   ? Platelets 122 (*)   ? All other components within normal limits  ?URINALYSIS, ROUTINE W REFLEX MICROSCOPIC - Abnormal; Notable for the following components:  ? Color, Urine RED (*)   ? APPearance CLOUDY (*)   ? Glucose, UA   (*)   ? Value: TEST NOT REPORTED DUE TO COLOR INTERFERENCE OF URINE PIGMENT  ? Hgb urine dipstick   (*)   ? Value: TEST NOT REPORTED DUE TO COLOR INTERFERENCE OF URINE PIGMENT  ? Bilirubin Urine   (*)   ? Value: TEST NOT REPORTED DUE TO COLOR INTERFERENCE OF URINE PIGMENT  ? Ketones, ur   (*)   ? Value: TEST NOT REPORTED DUE TO COLOR INTERFERENCE OF URINE PIGMENT  ? Protein, ur   (*)   ? Value: TEST NOT REPORTED DUE TO COLOR INTERFERENCE OF URINE PIGMENT  ? Nitrite   (*)   ? Value: TEST NOT REPORTED DUE TO COLOR  INTERFERENCE OF URINE PIGMENT  ? Leukocytes,Ua   (*)   ? Value: TEST NOT REPORTED DUE TO COLOR INTERFERENCE OF URINE PIGMENT  ? RBC / HPF >50 (*)   ? Bacteria, UA FEW (*)   ? All other components within normal limits  ?CBG MONITORING, ED - Abnormal; Notable for the following components:  ? Glucose-Capillary 147 (*)   ? All other components within normal limits  ?LACTIC ACID, PLASMA  ? ? ?EKG ?EKG Interpretation ? ?Date/Time:  Tuesday September 18 2021 10:18:32 EDT ?Ventricular Rate:  115 ?PR Interval:  157 ?QRS Duration: 82 ?QT Interval:  299 ?QTC Calculation: 414 ?R Axis:   24 ?Text Interpretation: Sinus tachycardia Low voltage, extremity and precordial leads Abnormal R-wave progression, early transition ST-t wave abnormality Abnormal ECG Confirmed by Carmin Muskrat [phone]) on 09/18/2021 11:13:49 AM ? ?Radiology ?DG Chest 2 View ? ?Result Date: 09/18/2021 ?CLINICAL DATA:  Dizziness.  Weakness.  Recent prostate surgery. EXAM: CHEST - 2 VIEW COMPARISON:  06/14/2009 FINDINGS: The heart size and mediastinal contours are within normal limits. Mild elevation of left hemidiaphragm again noted. Both lungs are clear. The visualized skeletal structures are unremarkable. IMPRESSION: No active cardiopulmonary disease. Electronically Signed   By: Marlaine Hind M.D.   On: 09/18/2021 11:11   ? ?Procedures ?Procedures  ? ? ?Medications Ordered in ED ?Medications  ?sodium chloride 0.9 % bolus 1,000 mL (0 mLs Intravenous Stopped 09/18/21 1335)  ?sodium chloride 0.9 % bolus 1,000 mL (0 mLs Intravenous Stopped 09/18/21 1513)  ? ? ?ED Course/ Medical Decision Making/ A&P ? ?This patient with a Hx of recent surgical procedure presents to the ED for concern of hypotension, this involves an extensive number of treatment options, and is a complaint that carries with it a high risk of complications and morbidity.   ? ?The differential diagnosis includes bacteremia, sepsis, hypovolemia ? ? ?Social Determinants of Health: ? ?Age ? ?Additional history  obtained: ? ?Additional history and/or information obtained from urology nurse practitioner, chart review, notable for details of today's evaluation, and surgical procedure ? ? ?After the initial evaluation, orders, including: Labs, urinalysis were initiated. ? ? ?Patient placed on Cardiac and Pulse-Oximetry Monitors. ?The patient was maintained on a cardiac monitor.  The cardiac monitored showed an rhythm of 90 sinus normal ?The patient was also maintained on pulse oximetry. The readings  were typically 99% room air normal ? ? ?On repeat evaluation of the patient improved ? ?Lab Tests: ? ?I personally interpreted labs.  The pertinent results include: Gross hematuria, no obvious infection, no substantial electrolyte abnormalities, creatinine 1.6 ? ?Imaging Studies ordered: ? ?I independently visualized and interpreted imaging which showed no pneumonia ?I agree with the radiologist interpretation ? ?Consultations Obtained: ? ?I requested consultation with the urology,  and discussed lab and imaging findings as well as pertinent plan - they recommend: Discharge with close outpatient follow-up ? ?Dispostion / Final MDM: ? ?After consideration of the diagnostic results and the patient's response to treatment, patient is going home.  This adult male presents after postop follow-up during which he was found to have hypotension.  He received 2 L fluid resuscitation, had normalization of his blood pressure had no evidence for bacteremia, sepsis, infection, improved substantially, clinically, after conversation with his surgical team was discharged in stable condition with close outpatient follow-up. ? ? ? ?Final Clinical Impression(s) / ED Diagnoses ?Final diagnoses:  ?Dehydration  ? ?  ?Carmin Muskrat, MD ?09/18/21 1651 ? ?

## 2021-09-18 NOTE — Discharge Instructions (Signed)
As discussed, your evaluation today has been largely reassuring.  But, it is important that you monitor your condition carefully, and do not hesitate to return to the ED if you develop new, or concerning changes in your condition. ? ?Otherwise, please follow-up with your physician for appropriate ongoing care. ? ?

## 2021-10-12 DIAGNOSIS — R351 Nocturia: Secondary | ICD-10-CM | POA: Diagnosis not present

## 2021-10-12 DIAGNOSIS — N401 Enlarged prostate with lower urinary tract symptoms: Secondary | ICD-10-CM | POA: Diagnosis not present

## 2022-02-05 DIAGNOSIS — R351 Nocturia: Secondary | ICD-10-CM | POA: Diagnosis not present

## 2022-02-05 DIAGNOSIS — N401 Enlarged prostate with lower urinary tract symptoms: Secondary | ICD-10-CM | POA: Diagnosis not present

## 2022-07-30 DIAGNOSIS — R351 Nocturia: Secondary | ICD-10-CM | POA: Diagnosis not present

## 2022-07-30 DIAGNOSIS — N401 Enlarged prostate with lower urinary tract symptoms: Secondary | ICD-10-CM | POA: Diagnosis not present

## 2023-08-15 DIAGNOSIS — R3121 Asymptomatic microscopic hematuria: Secondary | ICD-10-CM | POA: Diagnosis not present

## 2023-08-15 DIAGNOSIS — R351 Nocturia: Secondary | ICD-10-CM | POA: Diagnosis not present

## 2023-08-15 DIAGNOSIS — N401 Enlarged prostate with lower urinary tract symptoms: Secondary | ICD-10-CM | POA: Diagnosis not present

## 2023-09-17 DIAGNOSIS — D414 Neoplasm of uncertain behavior of bladder: Secondary | ICD-10-CM | POA: Diagnosis not present

## 2023-09-17 DIAGNOSIS — R3121 Asymptomatic microscopic hematuria: Secondary | ICD-10-CM | POA: Diagnosis not present

## 2023-09-22 ENCOUNTER — Other Ambulatory Visit: Payer: Self-pay | Admitting: Urology

## 2023-10-13 DIAGNOSIS — N400 Enlarged prostate without lower urinary tract symptoms: Secondary | ICD-10-CM | POA: Diagnosis not present

## 2023-10-13 DIAGNOSIS — K5730 Diverticulosis of large intestine without perforation or abscess without bleeding: Secondary | ICD-10-CM | POA: Diagnosis not present

## 2023-10-13 DIAGNOSIS — K4090 Unilateral inguinal hernia, without obstruction or gangrene, not specified as recurrent: Secondary | ICD-10-CM | POA: Diagnosis not present

## 2023-10-13 DIAGNOSIS — D414 Neoplasm of uncertain behavior of bladder: Secondary | ICD-10-CM | POA: Diagnosis not present

## 2023-10-13 DIAGNOSIS — R3121 Asymptomatic microscopic hematuria: Secondary | ICD-10-CM | POA: Diagnosis not present

## 2023-10-13 DIAGNOSIS — C61 Malignant neoplasm of prostate: Secondary | ICD-10-CM | POA: Diagnosis not present

## 2023-10-20 NOTE — Progress Notes (Signed)
 COVID Vaccine Completed: yes  Date of COVID positive in last 90 days:  PCP - Jonathon Neighbors, MD Cardiologist - n/a  Chest x-ray - n/a EKG - 10/21/23 Epic/chart Stress Test - n/a ECHO - n/a Cardiac Cath - n/a Pacemaker/ICD device last checked: n/a Spinal Cord Stimulator:n/a  Bowel Prep - no  Sleep Study - n/a CPAP -   Fasting Blood Sugar - pre DM, no meds or checks at home Checks Blood Sugar _____ times a day  Last dose of GLP1 agonist-  N/A GLP1 instructions:  Hold 7 days before surgery    Last dose of SGLT-2 inhibitors-  N/A SGLT-2 instructions:  Hold 3 days before surgery    Blood Thinner Instructions:  Last dose: n/a  Time: Aspirin Instructions: Last Dose:  Activity level: Can go up a flight of stairs and perform activities of daily living without stopping and without symptoms of chest pain or shortness of breath. Ambulates with cane.   Anesthesia review:   Patient denies shortness of breath, fever, cough and chest pain at PAT appointment  Patient verbalized understanding of instructions that were given to them at the PAT appointment. Patient was also instructed that they will need to review over the PAT instructions again at home before surgery.

## 2023-10-20 NOTE — Patient Instructions (Signed)
 SURGICAL WAITING ROOM VISITATION  Patients having surgery or a procedure may have no more than 2 support people in the waiting area - these visitors may rotate.    Children under the age of 52 must have an adult with them who is not the patient.  Due to an increase in RSV and influenza rates and associated hospitalizations, children ages 36 and under may not visit patients in [facility] hospitals.  Visitors with respiratory illnesses are discouraged from visiting and should remain at home.  If the patient needs to stay at the hospital during part of their recovery, the visitor guidelines for inpatient rooms apply. Pre-op nurse will coordinate an appropriate time for 1 support person to accompany patient in pre-op.  This support person may not rotate.    Please refer to the [facility] website for the visitor guidelines for Inpatients (after your surgery is over and you are in a regular room).    Your procedure is scheduled on: 10/31/23   Report to [facility] Main Entrance    Report to admitting at 10:00 AM   Call this number if you have problems the morning of surgery [phone]   Do not eat food or drink liquids:After Midnight.   After Midnight you may have the following liquids until ______ AM/ PM DAY OF SURGERY  Water Non-Citrus Juices (without pulp, NO RED-Apple, White grape, White cranberry) Black Coffee (NO MILK/CREAM OR CREAMERS, sugar ok)  Clear Tea (NO MILK/CREAM OR CREAMERS, sugar ok) regular and decaf                             Plain Jell-O (NO RED)                                           Fruit ices (not with fruit pulp, NO RED)                                     Popsicles (NO RED)                                                               Sports drinks like Gatorade (NO RED)                 If you have questions, please contact your surgeon's office.   FOLLOW BOWEL PREP AND ANY ADDITIONAL PRE OP INSTRUCTIONS YOU RECEIVED FROM YOUR SURGEON'S  OFFICE!!!     Oral Hygiene is also important to reduce your risk of infection.                                    Remember - BRUSH YOUR TEETH THE MORNING OF SURGERY WITH YOUR REGULAR TOOTHPASTE  DENTURES WILL BE REMOVED PRIOR TO SURGERY PLEASE DO NOT APPLY "Poly grip" OR ADHESIVES!!!   Do NOT smoke after Midnight   Stop all vitamins and herbal supplements 7 days before surgery.   Take these medicines the morning of surgery with  A SIP OF WATER: Tylenol, Synthroid  DO NOT TAKE ANY ORAL DIABETIC MEDICATIONS DAY OF YOUR SURGERY  Bring CPAP mask and tubing day of surgery.                              You may not have any metal on your body including jewelry, and body piercing             Do not wear lotions, powders, cologne, or deodorant              Men may shave face and neck.   Do not bring valuables to the hospital. [facility] IS NOT             RESPONSIBLE   FOR VALUABLES.   Contacts, glasses, dentures or bridgework may not be worn into surgery.  DO NOT BRING YOUR HOME MEDICATIONS TO THE HOSPITAL. PHARMACY WILL DISPENSE MEDICATIONS LISTED ON YOUR MEDICATION LIST TO YOU DURING YOUR ADMISSION IN THE HOSPITAL!    Patients discharged on the day of surgery will not be allowed to drive home.  Someone NEEDS to stay with you for the first 24 hours after anesthesia.   Special Instructions: Bring a copy of your healthcare power of attorney and living will documents the day of surgery if you haven't scanned them before.              Please read over the following fact sheets you were given: IF YOU HAVE QUESTIONS ABOUT YOUR PRE-OP INSTRUCTIONS PLEASE CALL [name]    If you received a COVID test during your pre-op visit  it is requested that you wear a mask when out in public, stay away from anyone that may not be feeling well and notify your surgeon if you develop symptoms. If you test positive for Covid or have been in contact with anyone that has tested positive in the  last 10 days please notify you surgeon.    [facility] - Preparing for Surgery Before surgery, you can play an important role.  Because skin is not sterile, your skin needs to be as free of germs as possible.  You can reduce the number of germs on your skin by washing with CHG (chlorahexidine gluconate) soap before surgery.  CHG is an antiseptic cleaner which kills germs and bonds with the skin to continue killing germs even after washing. Please DO NOT use if you have an allergy to CHG or antibacterial soaps.  If your skin becomes reddened/irritated stop using the CHG and inform your nurse when you arrive at Short Stay. Do not shave (including legs and underarms) for at least 48 hours prior to the first CHG shower.  You may shave your face/neck.  Please follow these instructions carefully:  1.  Shower with CHG Soap the night before surgery and the  morning of surgery.  2.  If you choose to wash your hair, wash your hair first as usual with your normal  shampoo.  3.  After you shampoo, rinse your hair and body thoroughly to remove the shampoo.                             4.  Use CHG as you would any other liquid soap.  You can apply chg directly to the skin and wash.  Gently with a scrungie or clean washcloth.  5.  Apply the CHG  Soap to your body ONLY FROM THE NECK DOWN.   Do   not use on face/ open                           Wound or open sores. Avoid contact with eyes, ears mouth and   genitals (private parts).                       Wash face,  Genitals (private parts) with your normal soap.             6.  Wash thoroughly, paying special attention to the area where your    surgery  will be performed.  7.  Thoroughly rinse your body with warm water from the neck down.  8.  DO NOT shower/wash with your normal soap after using and rinsing off the CHG Soap.                9.  Pat yourself dry with a clean towel.            10.  Wear clean pajamas.            11.  Place clean sheets on your bed the  night of your first shower and do not  sleep with pets. Day of Surgery : Do not apply any lotions/deodorants the morning of surgery.  Please wear clean clothes to the hospital/surgery center.  FAILURE TO FOLLOW THESE INSTRUCTIONS MAY RESULT IN THE CANCELLATION OF YOUR SURGERY  PATIENT SIGNATURE_________________________________  NURSE SIGNATURE__________________________________  ________________________________________________________________________

## 2023-10-21 ENCOUNTER — Encounter (HOSPITAL_COMMUNITY)
Admission: RE | Admit: 2023-10-21 | Discharge: 2023-10-21 | Disposition: A | Discharge status: Home / Self Care | Source: Ambulatory Visit | Attending: Urology | Admitting: Urology

## 2023-10-21 ENCOUNTER — Other Ambulatory Visit: Payer: Self-pay

## 2023-10-21 ENCOUNTER — Encounter (HOSPITAL_COMMUNITY): Payer: Self-pay

## 2023-10-21 VITALS — BP 112/85 | HR 50 | Temp 97.9°F | Resp 16 | Ht 65.0 in | Wt 144.0 lb

## 2023-10-21 DIAGNOSIS — Z01818 Encounter for other preprocedural examination: Secondary | ICD-10-CM | POA: Insufficient documentation

## 2023-10-21 HISTORY — DX: Depression, unspecified: F32.A

## 2023-10-21 HISTORY — DX: Headache, unspecified: R51.9

## 2023-10-21 HISTORY — DX: Hypothyroidism, unspecified: E03.9

## 2023-10-21 HISTORY — DX: Anxiety disorder, unspecified: F41.9

## 2023-10-21 HISTORY — DX: Pneumonia, unspecified organism: J18.9

## 2023-10-21 LAB — BASIC METABOLIC PANEL WITH GFR
Anion gap: 10 (ref 5–15)
BUN: 29 mg/dL — ABNORMAL HIGH (ref 8–23)
CO2: 22 mmol/L (ref 22–32)
Calcium: 9.3 mg/dL (ref 8.9–10.3)
Chloride: 105 mmol/L (ref 98–111)
Creatinine, Ser: 1.37 mg/dL — ABNORMAL HIGH (ref 0.61–1.24)
GFR, Estimated: 51 mL/min — ABNORMAL LOW (ref >60)
Glucose, Bld: 100 mg/dL — ABNORMAL HIGH (ref 70–99)
Potassium: 4.1 mmol/L (ref 3.5–5.1)
Sodium: 137 mmol/L (ref 135–145)

## 2023-10-21 LAB — CBC
HCT: 46.4 % (ref 39.0–52.0)
Hemoglobin: 15.3 g/dL (ref 13.0–17.0)
MCH: 30.8 pg (ref 26.0–34.0)
MCHC: 33 g/dL (ref 30.0–36.0)
MCV: 93.4 fL (ref 80.0–100.0)
Platelets: 184 10*3/uL (ref 150–400)
RBC: 4.97 MIL/uL (ref 4.22–5.81)
RDW: 14.5 % (ref 11.5–15.5)
WBC: 3.5 10*3/uL — ABNORMAL LOW (ref 4.0–10.5)
nRBC: 0 % (ref 0.0–0.2)

## 2023-10-31 ENCOUNTER — Ambulatory Visit (HOSPITAL_COMMUNITY): Payer: Self-pay | Admitting: Physician Assistant

## 2023-10-31 ENCOUNTER — Ambulatory Visit (HOSPITAL_BASED_OUTPATIENT_CLINIC_OR_DEPARTMENT_OTHER): Admitting: Anesthesiology

## 2023-10-31 ENCOUNTER — Ambulatory Visit (HOSPITAL_COMMUNITY)
Admission: RE | Admit: 2023-10-31 | Discharge: 2023-10-31 | Disposition: A | Discharge status: Home / Self Care | Attending: Urology | Admitting: Urology

## 2023-10-31 ENCOUNTER — Encounter (HOSPITAL_COMMUNITY): Payer: Self-pay | Admitting: Urology

## 2023-10-31 ENCOUNTER — Other Ambulatory Visit: Payer: Self-pay

## 2023-10-31 ENCOUNTER — Encounter (HOSPITAL_COMMUNITY)
Admission: RE | Disposition: A | Discharge status: Home / Self Care | Payer: Self-pay | Source: Home / Self Care | Attending: Urology

## 2023-10-31 DIAGNOSIS — C61 Malignant neoplasm of prostate: Secondary | ICD-10-CM | POA: Diagnosis not present

## 2023-10-31 DIAGNOSIS — N401 Enlarged prostate with lower urinary tract symptoms: Secondary | ICD-10-CM | POA: Diagnosis not present

## 2023-10-31 DIAGNOSIS — C7982 Secondary malignant neoplasm of genital organs: Secondary | ICD-10-CM | POA: Insufficient documentation

## 2023-10-31 DIAGNOSIS — C679 Malignant neoplasm of bladder, unspecified: Secondary | ICD-10-CM | POA: Diagnosis not present

## 2023-10-31 DIAGNOSIS — D494 Neoplasm of unspecified behavior of bladder: Secondary | ICD-10-CM | POA: Diagnosis not present

## 2023-10-31 DIAGNOSIS — Z87891 Personal history of nicotine dependence: Secondary | ICD-10-CM | POA: Diagnosis not present

## 2023-10-31 DIAGNOSIS — E039 Hypothyroidism, unspecified: Secondary | ICD-10-CM | POA: Diagnosis not present

## 2023-10-31 DIAGNOSIS — R310 Gross hematuria: Secondary | ICD-10-CM | POA: Insufficient documentation

## 2023-10-31 DIAGNOSIS — C674 Malignant neoplasm of posterior wall of bladder: Secondary | ICD-10-CM | POA: Insufficient documentation

## 2023-10-31 DIAGNOSIS — R351 Nocturia: Secondary | ICD-10-CM | POA: Diagnosis not present

## 2023-10-31 HISTORY — PX: TRANSURETHRAL RESECTION OF PROSTATE: SHX73

## 2023-10-31 HISTORY — PX: TRANSURETHRAL RESECTION OF BLADDER TUMOR: SHX2575

## 2023-10-31 SURGERY — TURBT (TRANSURETHRAL RESECTION OF BLADDER TUMOR)
Anesthesia: General

## 2023-10-31 MED ORDER — ACETAMINOPHEN 500 MG PO TABS
1000.0000 mg | ORAL_TABLET | Freq: Once | ORAL | Status: AC
  Administered 2023-10-31: 1000 mg via ORAL
  Filled 2023-10-31: qty 2

## 2023-10-31 MED ORDER — OXYCODONE HCL 5 MG PO TABS: 5.0000 mg | ORAL_TABLET | Freq: Once | ORAL | Status: DC | PRN

## 2023-10-31 MED ORDER — FENTANYL CITRATE PF 50 MCG/ML IJ SOSY: 25.0000 ug | PREFILLED_SYRINGE | INTRAMUSCULAR | Status: DC | PRN

## 2023-10-31 MED ORDER — CHLORHEXIDINE GLUCONATE 0.12 % MT SOLN
15.0000 mL | Freq: Once | OROMUCOSAL | Status: AC
  Administered 2023-10-31: 15 mL via OROMUCOSAL

## 2023-10-31 MED ORDER — ORAL CARE MOUTH RINSE: 15.0000 mL | Freq: Once | OROMUCOSAL | Status: AC

## 2023-10-31 MED ORDER — SODIUM CHLORIDE 0.9 % IR SOLN
Status: DC | PRN
  Administered 2023-10-31: 12000 mL

## 2023-10-31 MED ORDER — ONDANSETRON HCL 4 MG/2ML IJ SOLN
INTRAMUSCULAR | Status: AC
  Filled 2023-10-31: qty 2

## 2023-10-31 MED ORDER — DEXAMETHASONE SODIUM PHOSPHATE 10 MG/ML IJ SOLN
INTRAMUSCULAR | Status: DC | PRN
  Administered 2023-10-31: 5 mg via INTRAVENOUS

## 2023-10-31 MED ORDER — 0.9 % SODIUM CHLORIDE (POUR BTL) OPTIME
TOPICAL | Status: DC | PRN
  Administered 2023-10-31: 1000 mL

## 2023-10-31 MED ORDER — FENTANYL CITRATE (PF) 100 MCG/2ML IJ SOLN
INTRAMUSCULAR | Status: AC
  Filled 2023-10-31: qty 2

## 2023-10-31 MED ORDER — HYDROCODONE-ACETAMINOPHEN 5-325 MG PO TABS: 1.0000 | ORAL_TABLET | Freq: Four times a day (QID) | ORAL | 0 refills | Status: DC | PRN

## 2023-10-31 MED ORDER — OXYCODONE HCL 5 MG/5ML PO SOLN: 5.0000 mg | Freq: Once | ORAL | Status: DC | PRN

## 2023-10-31 MED ORDER — ONDANSETRON HCL 4 MG/2ML IJ SOLN
INTRAMUSCULAR | Status: DC | PRN
  Administered 2023-10-31: 4 mg via INTRAVENOUS

## 2023-10-31 MED ORDER — PROPOFOL 10 MG/ML IV BOLUS
INTRAVENOUS | Status: DC | PRN
  Administered 2023-10-31: 100 mg via INTRAVENOUS

## 2023-10-31 MED ORDER — PHENYLEPHRINE HCL (PRESSORS) 10 MG/ML IV SOLN
INTRAVENOUS | Status: DC | PRN
  Administered 2023-10-31: 80 ug via INTRAVENOUS
  Administered 2023-10-31: 160 ug via INTRAVENOUS

## 2023-10-31 MED ORDER — SODIUM CHLORIDE 0.9 % IV SOLN
2.0000 g | INTRAVENOUS | Status: AC
  Administered 2023-10-31: 2 g via INTRAVENOUS
  Filled 2023-10-31: qty 20

## 2023-10-31 MED ORDER — TRANEXAMIC ACID-NACL 1000-0.7 MG/100ML-% IV SOLN
1000.0000 mg | INTRAVENOUS | Status: AC
  Administered 2023-10-31: 1000 mg via INTRAVENOUS
  Filled 2023-10-31: qty 100

## 2023-10-31 MED ORDER — DEXAMETHASONE SODIUM PHOSPHATE 10 MG/ML IJ SOLN
INTRAMUSCULAR | Status: AC
  Filled 2023-10-31: qty 1

## 2023-10-31 MED ORDER — SUGAMMADEX SODIUM 200 MG/2ML IV SOLN
INTRAVENOUS | Status: DC | PRN
  Administered 2023-10-31: 250 mg via INTRAVENOUS

## 2023-10-31 MED ORDER — AMISULPRIDE (ANTIEMETIC) 5 MG/2ML IV SOLN: 10.0000 mg | Freq: Once | INTRAVENOUS | Status: DC | PRN

## 2023-10-31 MED ORDER — LIDOCAINE HCL (PF) 2 % IJ SOLN
INTRAMUSCULAR | Status: AC
  Filled 2023-10-31: qty 5

## 2023-10-31 MED ORDER — LACTATED RINGERS IV SOLN: INTRAVENOUS | Status: DC

## 2023-10-31 MED ORDER — FENTANYL CITRATE (PF) 100 MCG/2ML IJ SOLN
INTRAMUSCULAR | Status: DC | PRN
  Administered 2023-10-31 (×2): 50 ug via INTRAVENOUS

## 2023-10-31 MED ORDER — PROPOFOL 10 MG/ML IV BOLUS
INTRAVENOUS | Status: AC
  Filled 2023-10-31: qty 20

## 2023-10-31 MED ORDER — ROCURONIUM BROMIDE 10 MG/ML (PF) SYRINGE
PREFILLED_SYRINGE | INTRAVENOUS | Status: AC
  Filled 2023-10-31: qty 10

## 2023-10-31 MED ORDER — ROCURONIUM BROMIDE 10 MG/ML (PF) SYRINGE
PREFILLED_SYRINGE | INTRAVENOUS | Status: DC | PRN
  Administered 2023-10-31: 10 mg via INTRAVENOUS
  Administered 2023-10-31: 50 mg via INTRAVENOUS

## 2023-10-31 MED ORDER — AMOXICILLIN-POT CLAVULANATE 875-125 MG PO TABS: 1.0000 | ORAL_TABLET | Freq: Two times a day (BID) | ORAL | 0 refills | Status: DC

## 2023-10-31 SURGICAL SUPPLY — 21 items
BAG URINE DRAIN 2000ML AR STRL (UROLOGICAL SUPPLIES) ×1 IMPLANT
BAG URO CATCHER STRL LF (MISCELLANEOUS) ×1 IMPLANT
CATH FOLEY 2WAY 5CC 20FR (CATHETERS) IMPLANT
CATH FOLEY 2WAY SLVR 5CC 18FR (CATHETERS) IMPLANT
CATH URTH STD 24FR FL 3W 2 (CATHETERS) ×1 IMPLANT
CLOTH BEACON ORANGE TIMEOUT ST (SAFETY) ×1 IMPLANT
DRAPE FOOT SWITCH (DRAPES) ×1 IMPLANT
ELECT REM PT RETURN 15FT ADLT (MISCELLANEOUS) ×1 IMPLANT
GLOVE BIO SURGEON STRL SZ7.5 (GLOVE) ×1 IMPLANT
GOWN STRL REUS W/ TWL XL LVL3 (GOWN DISPOSABLE) ×1 IMPLANT
HOLDER FOLEY CATH W/STRAP (MISCELLANEOUS) ×1 IMPLANT
KIT TURNOVER KIT A (KITS) IMPLANT
LOOP CUT BIPOLAR 24F LRG (ELECTROSURGICAL) ×1 IMPLANT
MANIFOLD NEPTUNE II (INSTRUMENTS) ×1 IMPLANT
PACK CYSTO (CUSTOM PROCEDURE TRAY) ×1 IMPLANT
PAD PREP 24X48 CUFFED NSTRL (MISCELLANEOUS) ×1 IMPLANT
PLUG CATH AND CAP STRL 200 (CATHETERS) IMPLANT
SYR 30ML LL (SYRINGE) ×1 IMPLANT
SYRINGE TOOMEY IRRIG 70ML (MISCELLANEOUS) ×1 IMPLANT
TUBING CONNECTING 10 (TUBING) ×1 IMPLANT
TUBING UROLOGY SET (TUBING) ×1 IMPLANT

## 2023-10-31 NOTE — H&P (Signed)
 CC/HPI: 04/07/18: Patient with the above history. He presents today for possible voiding trial following recent episode of urinary retention. He presented to the ED on 10/16 with complaints of inability to void and suprapubic pain. He denied dysuria, fevers, nausea, or vomiting at the time. Bladder scan showed residual of >300 cc. Indwelling catheter was placed with approximatly 500 cc output noted. UA at that time was unremarkable. He remains on Tamsulosin 0.8 mg at bedtime. Today he states that he has been tolerating the catheter and it has been draining well. He does complain of discomfort in his penis and at his urethral meatus. He denies hematuria or fevers. He does past history of urinary retention many years ago. At that time of retention he denies use of any new medications, constipation, or change in activities.   04/21/18: He returns today for follow up following voiding trial on 10/22. Overall, he states that he has been doing well. He states that he feels that he has been voiding fairly well. He denies bothersome frequency or urgency during the daytime. He does have nocturia X 3-4 times. He states that his stream is fair most of the time, but will have weak stream intermittently. He remains on Tamsulosin 0.8 mg at bedtime. He denies dysuria, gross hematuira, fever, or chills. He denies issues with constipation at this time.   01/28/2019  Patient continues on Flomax 0.8 mg nightly. He denies hematuria or dysuria. He continues to have mild voiding complaints that are not bothersome, including nocturia 3, weak stream. He otherwise has no complaints. Postvoid residual is 0.   02/17/2020  Voiding complaints are stable on Flomax 0.8 mg nightly. He continues to have nocturia 2-3 times a night but he goes right back to sleep and is not bothered. Continues with a weak stream that is moderate. He is not very bothered by this as well.   03/15/2021: Continued on 0.8 mg tamsulosin at last visit. Now back  today for annual office visit. UA today with mild pyuria, moderate bacteria. No MH.   Overall doing well. Voiding symptoms grossly stable over the past year. He has had no new or worsening issues with hesitancy or straining. He typically feels empty after each void. He has had no dysuria, gross hematuria or interval treatment for UTI. Symptom score sheet today is 10, PVR 110 mL. He did not completely empty his bladder when providing a urine specimen. Nocturia remains variable, ranging anywhere from 2-4 times nightly. A lot of this is dependent on fluid modification which he does not often practice. He will often fall asleep in the recliner not empty his bladder prior to going to bed.   08/29/2021  Since last seeing the patient, the patient has been noticing some particulate in his urine/discoloration. Has been told he has microscopic hematuria. Unsure if he has had gross hematuria. But he does have some discoloration to his urine. He also is having some dysuria.   09/06/2021  Patient's urine culture was positive at the last visit for MRSA. He is on Bactrim. In the interval, he went to the emergency department with gross hematuria and urinary retention. Foley catheter was placed. He has been having a lot of discomfort with the catheter. He underwent CT hematuria protocol yesterday. This revealed a approximately 120 g prostate. He also had bilateral renal cysts.   09/18/2021: 83 year old male who underwent a TURP on 09/14/2021 presents today for potential voiding trial. He endorses severe spasms over the weekend associated with leakage around the  Foley catheter. Foley catheter bag does have dark red urine noted. PVR shows 16 mL. He endorses dizziness. His blood pressure today is low in the 80s and his heart rate is in the 130s. He denies fevers and chills. He reports dizziness when standing or changing position. He does have some fatigue. He reports that he does not feel well.   10/12/2021  Patient status post  TURP. Pathology negative. 32 g of prostate tissue was excised. Patient is voiding much better. He went to the emergency department at the last office visit due to concerns of tachycardia and hypotension. Hemoglobin was not concerning. He improved with fluids. He was discharged from the emergency department and has been doing well since. He was primarily having a lot of problems from his catheter and ultimately he was likely having the symptoms because of severe vasovagal responses from the catheter. He now has significantly improved voiding symptoms. Only symptom is nocturia x3. Otherwise urinating like a fire hose.   02/05/2022: Kenneth Sanford is an 83 year old man who underwent TURP back in March. He reports that since passing his trial of void he has been doing well. He endorses that his urine stream is strong and he is very pleased with his surgery. He denies urinary leakage and urgency. He tends to get up 2-3 times per night. He is not bothered by this. His PVR is 74 mL   07/30/2022  Patient been doing very well. Has nocturia x 3 but drinks a lot of fluids at night. Understands that he can decrease water/fluid intake after about 6 or 7 PM and likely not have as much nocturia. Has a mildly weak stream but is not bothered. PVR is 4.   08/15/23: This very pleasant patient is here for annual f/u of BPH and nocturia. PVR is 16. The patient's voiding symptoms are unchanged from last visit. His stream is strong and he voids every 2-3 hours during the day and at night. He reports drinking lots of fluids all the way up until bedtime and says Dr. Parke Boll has discussed behavior modification with him prior; reducing fluids but he is okay with getting up at night and easily goes back to sleep. Denies dysuria, visible blood in urine, or recent urinary tract infections.   09/17/2023  At the last visit, patient was noted to have microscopic hematuria, greater than 60 RBC. Urine culture was negative. Has previous negative  hematuria workup except for large prostate.   10/31/2023 Patient presents today for transurethral resection of bladder tumor and transurethral resection of prostate    ALLERGIES: acyclovir Amphotericin B Liposome Aspirin CR TBCR    MEDICATIONS: Levothyroxine Sodium 50 MCG Capsule  Tylenol  Vitamin D3     GU PSH: Cystoscopy - 2023 Cystoscopy TURP - 2023 Locm 300-399Mg /Ml Iodine,1Ml - 2023       PSH Notes: Inguinal Hernia Repair, Ventral Hernia Repair, Knee Surgery, Knee Surgery, Small Bowel Resection   NON-GU PSH: Incisional hernia repair (open) - 2010 Small bowel resection - 2010 Visit Complexity (formerly GPC1X) - 08/15/2023, 07/30/2022     GU PMH: BPH w/LUTS - 08/15/2023, - 07/30/2022, - 02/05/2022, - 10/12/2021, - 09/18/2021, - 2023, - 2023, - 2023, - 2023, - 2022, Benign prostatic hyperplasia with urinary obstruction, - 2016 Microscopic hematuria - 08/15/2023 Nocturia - 08/15/2023, - 07/30/2022, - 02/05/2022, - 10/12/2021, - 2023, - 2022 (Stable), - 2018 Gross hematuria - 09/18/2021, - 2023, - 2023, - 2023 Urinary Retention - 2023, - 2023 (Stable), - 2023, - 2019  Acute Cystitis/UTI - 2023 Weak Urinary Stream - 2020 Elevated PSA, Elevated prostate specific antigen (PSA) - 2016 Urinary Retention, Unspec, Urinary retention - 2014 Urinary Tract Inf, Unspec site, Urinary tract infection - 2014      PMH Notes:  2009-01-26 09:10:53 - Note: Leukemia  2009-01-26 09:10:53 - Note: Arthritis   NON-GU PMH: Orthostatic hypotension - 09/18/2021 Encounter for general adult medical examination without abnormal findings, Encounter for preventive health examination - 2015    FAMILY HISTORY: Family Health Status - Mother's Age - Runs In Family Father Deceased At Age86 ___ - Runs In Family No pertinent family history - Other   SOCIAL HISTORY: Marital Status: Married Preferred Language: English; Ethnicity: Not Hispanic Or Latino; Race: Black or African American Current Smoking Status: Patient  does not smoke anymore. Has not smoked since 04/11/1984.  Has not drank since 04/11/1977.  Drinks 1 caffeinated drink per day.     Notes: Former smoker, Marital History - Currently Married, Alcohol Use, Caffeine Use, Tobacco Use   REVIEW OF SYSTEMS:    GU Review Male:   Patient denies frequent urination, hard to postpone urination, burning/ pain with urination, get up at night to urinate, leakage of urine, stream starts and stops, trouble starting your stream, have to strain to urinate , erection problems, and penile pain.  Gastrointestinal (Upper):   Patient denies nausea, vomiting, and indigestion/ heartburn.  Gastrointestinal (Lower):   Patient denies diarrhea and constipation.  Constitutional:   Patient denies fatigue, weight loss, fever, and night sweats.  Skin:   Patient denies skin rash/ lesion and itching.  Eyes:   Patient denies blurred vision and double vision.  Ears/ Nose/ Throat:   Patient denies sore throat and sinus problems.  Hematologic/Lymphatic:   Patient denies swollen glands and easy bruising.  Cardiovascular:   Patient denies leg swelling and chest pains.  Respiratory:   Patient denies cough and shortness of breath.  Endocrine:   Patient denies excessive thirst.  Musculoskeletal:   Patient denies back pain and joint pain.  Neurological:   Patient denies headaches and dizziness.  Psychologic:   Patient denies depression and anxiety.   BP 132/88   Pulse 89   Temp 98.9 F (37.2 C) (Oral)   Resp 18   Ht 5\' 5"  (1.651 m)   Wt 65 kg   SpO2 96%   BMI 23.85 kg/m    MULTI-SYSTEM PHYSICAL EXAMINATION:    Constitutional: Well-nourished. No physical deformities. Normally developed. Good grooming.  Gastrointestinal: No mass, no tenderness, no rigidity, non obese abdomen.  Eyes: Normal conjunctivae. Normal eyelids.  Musculoskeletal: Normal gait and station of head and neck.        ASSESSMENT:      ICD-10 Details  1 GU:   Microscopic hematuria - R31.21 Chronic, Stable   2   Bladder tumor/neoplasm - D41.4 Undiagnosed New Problem   PLAN:         Notes:   Patient has multiple bladder tumors and also some papillary change within the prostatic urethra. Will plan for transurethral resection of bladder tumor and transurethral resection of prostate. Risk benefits discussed including but not limited to bleeding, infection, injury to surrounding structures, bladder perforation.

## 2023-10-31 NOTE — Anesthesia Procedure Notes (Signed)
 Procedure Name: Intubation Date/Time: 10/31/2023 12:30 PM  Performed by: Manuela Sella, CRNAPre-anesthesia Checklist: Patient identified, Emergency Drugs available, Suction available and Patient being monitored Patient Re-evaluated:Patient Re-evaluated prior to induction Oxygen Delivery Method: Circle system utilized Preoxygenation: Pre-oxygenation with 100% oxygen Induction Type: IV induction Ventilation: Mask ventilation without difficulty Laryngoscope Size: Mac and 4 Grade View: Grade II Tube type: Oral Tube size: 7.5 mm Number of attempts: 1 Airway Equipment and Method: Stylet Placement Confirmation: ETT inserted through vocal cords under direct vision, positive ETCO2 and breath sounds checked- equal and bilateral Secured at: 21 cm Tube secured with: Tape Dental Injury: Teeth and Oropharynx as per pre-operative assessment

## 2023-10-31 NOTE — Op Note (Addendum)
 Operative Note  Preoperative diagnosis:  1.  Bladder tumor with likely prostatic involvement  Postoperative diagnosis: 1.  Bladder tumor--medium with likely prostatic involvement  Procedure(s): 1.  Transurethral resection of bladder tumor--medium 2.  Transurethral resection of prostate residual tissue/tumor  Surgeon: Leila Punt, MD  Assistants: None  Anesthesia: General  Complications: Posterior bladder wall diverticular perforation  EBL: 20 cc  Specimens: 1.  Bladder tumor 2.  Prostate chips  Drains/Catheters: 1.  20 French Foley catheter  Intraoperative findings: 1.  Normal anterior urethra 2.  Prostate lobes at the apex had fused posteriorly.  Open channel anteriorly with evidence of prior resection.  Along the static mucosa was papillary change consistent with prostatic involvement of urothelial cell carcinoma.  All visualized tumor resected in the prostate. 3.  Posterior bladder wall tumors measuring about 2 cm on the posterior bladder wall and 3 to 4 cm on the posterior lateral wall towards the right.  Tumor involved a posterior wall diverticulum and during resection there was a small perforation of the diverticulum.  The bladder held water well and location was retroperitoneal.  Therefore, prolonged Foley catheter planned.  All tumor resected superficially.  Indication: 83 year old male found to have bladder tumor on cystoscopy as well as likely prostatic involvement.  He presents for the previously mentioned operation.  Description of procedure:  The patient was identified and consent was obtained.  The patient was taken to the operating room and placed in the supine position.  The patient was placed under general anesthesia.  Perioperative antibiotics were administered.  The patient was placed in dorsal lithotomy.  Patient was prepped and draped in a standard sterile fashion and a timeout was performed.  A 26 French resectoscope with visual obturator in place was  advanced into the urethra and into the bladder.  Complete cystoscopy was performed with findings noted above.  I exchanged for the bipolar working element.  I proceeded to resect the tumors of interest.  There was significant involvement of a small posterior wall diverticulum that was challenging to resect.  Ultimately was able to resect the tumor and there but in the process there was a small perforation posteriorly that was retroperitoneal in location.  I resected the remainder of the bladder tumor on the posterior lateral wall.  All tumor in the bladder was resected.  With the bladder completely full, there was no extravasation of water from the bladder.  I collected the bladder tumor specimen and turned my attention to the prostate.  I proceeded to resect areas of prostatic involvement and fulgurated the resection beds.  I collected the prostate chips separately.  There was wide open channel after resection.  There was no visual superficial tumor remaining.  I withdrew the scope and placed a 20 [place] coud catheter.  This concluded the operation.  Patient tolerated the procedure well was stable postoperatively.  Plan: Follow-up in 1 week for voiding trial and pathology review.

## 2023-10-31 NOTE — Discharge Instructions (Signed)

## 2023-10-31 NOTE — Anesthesia Postprocedure Evaluation (Signed)
 Anesthesia Post Note  Patient: Kenneth Sanford  Procedure(s) Performed: TURBT (TRANSURETHRAL RESECTION OF BLADDER TUMOR) TURP (TRANSURETHRAL RESECTION OF PROSTATE)     Patient location during evaluation: PACU Anesthesia Type: General Level of consciousness: awake Pain management: pain level controlled Vital Signs Assessment: post-procedure vital signs reviewed and stable Respiratory status: spontaneous breathing, nonlabored ventilation and respiratory function stable Cardiovascular status: blood pressure returned to baseline and stable Postop Assessment: no apparent nausea or vomiting Anesthetic complications: no   No notable events documented.  Last Vitals:  Vitals:   10/31/23 1408 10/31/23 1426  BP: 133/89 119/76  Pulse: 73 75  Resp: 19 18  Temp:  36.7 C  SpO2: 98% 96%    Last Pain:  Vitals:   10/31/23 1426  TempSrc:   PainSc: 0-No pain                 Sadarius Norman P Rimas Gilham

## 2023-10-31 NOTE — Transfer of Care (Signed)
 Immediate Anesthesia Transfer of Care Note  Patient: Kenneth Sanford  Procedure(s) Performed: Procedure(s): TURBT (TRANSURETHRAL RESECTION OF BLADDER TUMOR) (N/A) TURP (TRANSURETHRAL RESECTION OF PROSTATE) (N/A)  Patient Location: PACU  Anesthesia Type:General  Level of Consciousness: Patient easily awoken, comfortable, cooperative, following commands, responds to stimulation.   Airway & Oxygen Therapy: Patient spontaneously breathing, ventilating well, oxygen via simple oxygen mask.  Post-op Assessment: Report given to PACU RN, vital signs reviewed and stable, moving all extremities.   Post vital signs: Reviewed and stable.  Complications: No apparent anesthesia complications Last Vitals:  Vitals Value Taken Time  BP 142/88 10/31/23 1339  Temp    Pulse 66 10/31/23 1341  Resp 20 10/31/23 1341  SpO2 100 % 10/31/23 1341  Vitals shown include unfiled device data.  Last Pain:  Vitals:   10/31/23 1102  TempSrc:   PainSc: 0-No pain         Complications: No notable events documented.

## 2023-10-31 NOTE — Anesthesia Preprocedure Evaluation (Addendum)
 Anesthesia Evaluation  Patient identified by MRN, date of birth, ID band Patient awake    Reviewed: Allergy & Precautions, NPO status , Patient's Chart, lab work & pertinent test results  Airway Mallampati: II  TM Distance: >3 FB Neck ROM: Full    Dental  (+) Dental Advisory Given, Chipped   Pulmonary former smoker   Pulmonary exam normal breath sounds clear to auscultation       Cardiovascular negative cardio ROS Normal cardiovascular exam Rhythm:Regular Rate:Normal     Neuro/Psych  Headaches PSYCHIATRIC DISORDERS Anxiety Depression       GI/Hepatic negative GI ROS, Neg liver ROS,,,  Endo/Other  Hypothyroidism    Renal/GU negative Renal ROS  negative genitourinary   Musculoskeletal negative musculoskeletal ROS (+)    Abdominal   Peds  Hematology negative hematology ROS (+) AML   Anesthesia Other Findings   Reproductive/Obstetrics                             Anesthesia Physical Anesthesia Plan  ASA: 2  Anesthesia Plan: General   Post-op Pain Management: Tylenol PO (pre-op)*   Induction: Intravenous  PONV Risk Score and Plan: Dexamethasone and Ondansetron  Airway Management Planned: Oral ETT  Additional Equipment:   Intra-op Plan:   Post-operative Plan: Extubation in OR  Informed Consent: I have reviewed the patients History and Physical, chart, labs and discussed the procedure including the risks, benefits and alternatives for the proposed anesthesia with the patient or authorized representative who has indicated his/her understanding and acceptance.     Dental advisory given  Plan Discussed with: CRNA  Anesthesia Plan Comments:        Anesthesia Quick Evaluation

## 2023-11-01 ENCOUNTER — Encounter (HOSPITAL_COMMUNITY): Payer: Self-pay | Admitting: Urology

## 2023-11-03 LAB — SURGICAL PATHOLOGY

## 2023-11-04 DIAGNOSIS — C678 Malignant neoplasm of overlapping sites of bladder: Secondary | ICD-10-CM | POA: Diagnosis not present

## 2023-11-18 DIAGNOSIS — C679 Malignant neoplasm of bladder, unspecified: Secondary | ICD-10-CM | POA: Insufficient documentation

## 2023-11-18 NOTE — Progress Notes (Unsigned)
 [facility] CONSULT NOTE  Patient Care Team: Jonathon Neighbors, MD as PCP - General (Family Medicine) Tami Falcon, MD as Consulting Physician (Gastroenterology)  ASSESSMENT & PLAN:  Kenneth Sanford is a 83 y.o.male with history of BPH with TURP, CKD3, hypothyroidism [facility].  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 The carcinoma involving adjacent prostatic ducts and stroma. CT reports negative for metastatic disease. Clinical cT4aN0M0 stage IIIA.  Discussed standard of care for bladder cancer. He has borderline renal function.  Assessment & Plan Urothelial carcinoma of bladder (HCC)   No orders of the defined types were placed in this encounter.   The total time spent in the appointment was {CHL ONC TIME VISIT - [id number]} encounter with patients including review of chart and various tests results, discussions about plan of care and coordination of care plan   All questions were answered. The patient knows to call the clinic with any problems, questions or concerns. No barriers to learning was detected.  Lowanda Ruddy, MD 6/3/20255:05 PM  CHIEF COMPLAINTS/PURPOSE OF CONSULTATION:  ***  HISTORY OF PRESENTING ILLNESS:  Kenneth Sanford 83 y.o. male is here because of UC.  I have reviewed his chart and materials related to his cancer extensively and collaborated history with the patient. Summary of oncologic history is as follows: Oncology History  Urothelial carcinoma of bladder (HCC)  09/14/2021 Pathology Results   Prostate, chips BENIGN NODULAR GLANDULAR AND STROMAL HYPERPLASIA MILD CHRONIC PROSTATITIS NEGATIVE FOR CARCINOMA   10/13/2023 Imaging   CT AP w/wo  At least 2 small enhancing mural nodules along the right lateral bladder wall suspicious for bladder carcinoma..  Markedly enlarged prostate gland which indents the bladder base. Asymmetric soft  tissue density again seen protruding from the right lateral prostate base and right seminal vesicle. Prostate carcinoma with invasino of right seminal vesicle cannot be excluded.  No evidence of metastatic disease.  No evidenced of upper urinary tract neoplasm, urolithiasis or hydronephrosis.    10/31/2023 Pathology Results   A. BLADDER, TUMOR, TURBT:  Infiltrating high grade papillary urothelial carcinoma with  micropapillary (40%) component  The carcinoma invades muscularis propria (detrusor muscle)   B. PROSTATE CHIPS, TURP:  High grade urothelial carcinoma  The carcinoma involving adjacent prostatic ducts and stroma    11/18/2023 Initial Diagnosis   Urothelial carcinoma of bladder [facility])     MEDICAL HISTORY:  Past Medical History:  Diagnosis Date   Anxiety    Cancer (HCC)    leukemia   Depression    Headache    Hypothyroidism    Pneumonia     SURGICAL HISTORY: Past Surgical History:  Procedure Laterality Date   ABDOMINAL SURGERY     HERNIA REPAIR     KNEE SURGERY Bilateral    PROSTATE BIOPSY     TRANSURETHRAL RESECTION OF BLADDER TUMOR N/A 10/31/2023   Procedure: TURBT (TRANSURETHRAL RESECTION OF BLADDER TUMOR);  Surgeon: Samson Croak, MD;  Location: [facility];  Service: Urology;  Laterality: N/A;   TRANSURETHRAL RESECTION OF PROSTATE N/A 10/31/2023   Procedure: TURP (TRANSURETHRAL RESECTION OF PROSTATE);  Surgeon: Samson Croak, MD;  Location: [facility];  Service: Urology;  Laterality: N/A;    SOCIAL HISTORY: Social History   Socioeconomic History   Marital status: Married    Spouse name: Not on file   Number of children: Not on file   Years of education: Not on file  Highest education level: Not on file  Occupational History   Not on file  Tobacco Use   Smoking status: Former    Current packs/day: 0.00    Types: Cigarettes    Quit date: 06/18/1983    Years since quitting: 40.4   Smokeless tobacco: Not on file  Vaping Use   Vaping status: Never Used   Substance and Sexual Activity   Alcohol use: No   Drug use: No   Sexual activity: Not on file  Other Topics Concern   Not on file  Social History Narrative   Not on file   Social Drivers of Health   Financial Resource Strain: Not on file  Food Insecurity: Not on file  Transportation Needs: Not on file  Physical Activity: Not on file  Stress: Not on file  Social Connections: Not on file  Intimate Partner Violence: Not on file    FAMILY HISTORY: Family History  Problem Relation Age of Onset   Hypertension Mother     ALLERGIES:  is allergic to acyclovir and related, amphotericin b, and aspirin.  MEDICATIONS:  Current Outpatient Medications  Medication Sig Dispense Refill   acetaminophen  (TYLENOL ) 500 MG tablet Take 500-1,000 mg by mouth every 6 (six) hours as needed for mild pain or headache.     amoxicillin -clavulanate (AUGMENTIN ) 875-125 MG tablet Take 1 tablet by mouth 2 (two) times daily. 14 tablet 0   azithromycin  (ZITHROMAX ) 250 MG tablet Take 1 tablet (250 mg total) by mouth daily. (Patient not taking: Reported on 09/18/2021) 4 tablet 0   bacitracin  ointment Apply to each nare twice daily. (Patient not taking: Reported on 09/18/2021) 14 g 0   Cholecalciferol (VITAMIN D3) 50 MCG (2000 UT) TABS Take 2,000 Units by mouth at bedtime.     docusate sodium (COLACE) 100 MG capsule Take 100 mg by mouth daily as needed for moderate constipation.     HYDROcodone -acetaminophen  (NORCO/VICODIN) 5-325 MG tablet Take 1 tablet by mouth every 6 (six) hours as needed. 10 tablet 0   SYNTHROID 50 MCG tablet Take 50 mcg by mouth daily before breakfast.     No current facility-administered medications for this visit.    REVIEW OF SYSTEMS:   All relevant systems were reviewed with the patient and are negative.  PHYSICAL EXAMINATION: ECOG PERFORMANCE STATUS: {CHL ONC ECOG PS:[id number]}  There were no vitals filed for this visit. There were no vitals filed for this visit.  GENERAL:  alert, no distress and comfortable SKIN: skin color is normal, no jaundice, rashes or significant lesions EYES: sclera clear OROPHARYNX: no exudate, no erythema NECK: supple LYMPH:  no palpable lymphadenopathy in the cervical, axillary regions LUNGS: Effort normal, no respiratory distress.  Clear to auscultation bilaterally HEART: regular rate & rhythm and no lower extremity edema ABDOMEN: soft, non-tender and nondistended Musculoskeletal: no point tenderness NEURO: no focal motor/sensory deficits  LABORATORY DATA:  I have reviewed the data as listed Lab Results  Component Value Date   WBC 3.5 (L) 10/21/2023   HGB 15.3 10/21/2023   HCT 46.4 10/21/2023   MCV 93.4 10/21/2023   PLT 184 10/21/2023   Recent Labs    10/21/23 0930  NA 137  K 4.1  CL 105  CO2 22  GLUCOSE 100*  BUN 29*  CREATININE 1.37*  CALCIUM 9.3  GFRNONAA 51*    RADIOGRAPHIC STUDIES: I have personally reviewed the radiological images as listed and agreed with the findings in the report. No results found.

## 2023-11-19 ENCOUNTER — Other Ambulatory Visit: Payer: Self-pay | Admitting: *Deleted

## 2023-11-19 ENCOUNTER — Telehealth: Payer: Self-pay | Admitting: *Deleted

## 2023-11-19 ENCOUNTER — Inpatient Hospital Stay

## 2023-11-19 VITALS — BP 110/87 | HR 105 | Temp 97.8°F | Resp 16 | Wt 140.1 lb

## 2023-11-19 DIAGNOSIS — D72819 Decreased white blood cell count, unspecified: Secondary | ICD-10-CM

## 2023-11-19 DIAGNOSIS — Z87891 Personal history of nicotine dependence: Secondary | ICD-10-CM | POA: Diagnosis not present

## 2023-11-19 DIAGNOSIS — C679 Malignant neoplasm of bladder, unspecified: Secondary | ICD-10-CM

## 2023-11-19 LAB — CMP (CANCER CENTER ONLY)
ALT: 17 U/L (ref 0–44)
AST: 24 U/L (ref 15–41)
Albumin: 4.1 g/dL (ref 3.5–5.0)
Alkaline Phosphatase: 59 U/L (ref 38–126)
Anion gap: 8 (ref 5–15)
BUN: 24 mg/dL — ABNORMAL HIGH (ref 8–23)
CO2: 28 mmol/L (ref 22–32)
Calcium: 9.3 mg/dL (ref 8.9–10.3)
Chloride: 101 mmol/L (ref 98–111)
Creatinine: 1.54 mg/dL — ABNORMAL HIGH (ref 0.61–1.24)
GFR, Estimated: 44 mL/min — ABNORMAL LOW (ref >60)
Glucose, Bld: 125 mg/dL — ABNORMAL HIGH (ref 70–99)
Potassium: 4.1 mmol/L (ref 3.5–5.1)
Sodium: 137 mmol/L (ref 135–145)
Total Bilirubin: 0.7 mg/dL (ref 0.0–1.2)
Total Protein: 8.3 g/dL — ABNORMAL HIGH (ref 6.5–8.1)

## 2023-11-19 LAB — FOLATE: Folate: 6.3 ng/mL (ref >5.9)

## 2023-11-19 LAB — VITAMIN B12: Vitamin B-12: 419 pg/mL (ref 180–914)

## 2023-11-19 NOTE — Telephone Encounter (Signed)
 Pt came in as a new referral with anxiety about diagnosis as well as wife dementia diagnosis. Very tearful and uncertain about his ability to be able to handle circumstances. Referral to SW has been placed.

## 2023-11-19 NOTE — Patient Instructions (Signed)
 Dear Kenneth Sanford  Your bladder tumor is called high grade papillary urothelial carcinoma with micropapillary (40%) component. The cancer has spread to the adjacent prostate. CT reports negative for metastatic disease. Clinical cT4aN0M0 stage IIIA.   Discussed standard of care for bladder cancer. He has borderline renal function. CrCl of 36 from May of 2025. We discussed though benefit of neoadjuvant therapy may be beneficial in micropapillary histology cisplatin based therapy is not recommended with his renal function. It has risk for kidney failure. He has no lymph node metastases yet but has extensive disease with prostate invasion. We will get a PET asap. If no cancer spread outside these area, recommend proceed with surgery with cystoprostatectomy and follow up after pathology.   I have order PET for you. If you don't get a call in 24-48 hours, call [phone]. Try to get the PET before seeing Dr. Secundino Dach.  Follow up with Dr. Secundino Dach as scheduled next week.

## 2023-11-19 NOTE — Assessment & Plan Note (Signed)
 PET stat before seeing Dr. Secundino Dach Evaluate for cystoprostatectomy  Follow up 3-4 weeks postop if proceeding with surgery If not surgical candidate, will return to discuss palliative treatment.

## 2023-11-21 ENCOUNTER — Inpatient Hospital Stay

## 2023-11-21 NOTE — Progress Notes (Signed)
 [facility] Clinical Social Work  Clinical Social Work was referred by nurse for assessment of psychosocial needs.  Clinical Social Worker contacted patient by phone to offer support and assess for needs. Patient expressed depressive and anxious symptoms, reporting that he is crying daily. Patient stated anxiety is attributed to potential impacts of diagnosis. No SI/HI.Appointment scheduled.  Maudie Sorrow, LCSW  Clinical Social Worker [facility]

## 2023-11-22 LAB — METHYLMALONIC ACID, SERUM: Methylmalonic Acid, Quantitative: 247 nmol/L (ref 0–378)

## 2023-11-24 ENCOUNTER — Ambulatory Visit: Payer: Self-pay

## 2023-11-24 NOTE — Telephone Encounter (Signed)
 Notified of message below

## 2023-11-24 NOTE — Telephone Encounter (Signed)
-----   Message from Lowanda Ruddy sent at 11/24/2023  7:55 AM EDT ----- Dorathy Gals Please let him know no concerning findings from lab. Chronic kidney disease seems stable. Fluid goal about 64 oz a day to prevent worsening renal function. Thanks

## 2023-11-28 ENCOUNTER — Encounter (HOSPITAL_COMMUNITY)
Admission: RE | Admit: 2023-11-28 | Discharge: 2023-11-28 | Disposition: A | Discharge status: Home / Self Care | Source: Ambulatory Visit

## 2023-11-28 DIAGNOSIS — C679 Malignant neoplasm of bladder, unspecified: Secondary | ICD-10-CM | POA: Diagnosis not present

## 2023-11-28 DIAGNOSIS — K5730 Diverticulosis of large intestine without perforation or abscess without bleeding: Secondary | ICD-10-CM | POA: Diagnosis not present

## 2023-11-28 DIAGNOSIS — N400 Enlarged prostate without lower urinary tract symptoms: Secondary | ICD-10-CM | POA: Insufficient documentation

## 2023-11-28 DIAGNOSIS — N1339 Other hydronephrosis: Secondary | ICD-10-CM | POA: Insufficient documentation

## 2023-11-28 DIAGNOSIS — H748X2 Other specified disorders of left middle ear and mastoid: Secondary | ICD-10-CM | POA: Insufficient documentation

## 2023-11-28 DIAGNOSIS — I700 Atherosclerosis of aorta: Secondary | ICD-10-CM | POA: Diagnosis not present

## 2023-11-28 DIAGNOSIS — K439 Ventral hernia without obstruction or gangrene: Secondary | ICD-10-CM | POA: Diagnosis not present

## 2023-11-28 LAB — GLUCOSE, CAPILLARY: Glucose-Capillary: 106 mg/dL — ABNORMAL HIGH (ref 70–99)

## 2023-11-28 MED ORDER — FLUDEOXYGLUCOSE F - 18 (FDG) INJECTION
6.8700 | Freq: Once | INTRAVENOUS | Status: AC
  Administered 2023-11-28: 6.87 via INTRAVENOUS

## 2024-01-08 ENCOUNTER — Inpatient Hospital Stay

## 2024-01-08 ENCOUNTER — Telehealth: Payer: Self-pay

## 2024-01-08 ENCOUNTER — Other Ambulatory Visit: Payer: Self-pay

## 2024-01-08 ENCOUNTER — Inpatient Hospital Stay (HOSPITAL_BASED_OUTPATIENT_CLINIC_OR_DEPARTMENT_OTHER)

## 2024-01-08 VITALS — BP 130/87 | HR 85 | Temp 97.2°F | Resp 16 | Wt 145.9 lb

## 2024-01-08 DIAGNOSIS — E039 Hypothyroidism, unspecified: Secondary | ICD-10-CM | POA: Insufficient documentation

## 2024-01-08 DIAGNOSIS — Z9079 Acquired absence of other genital organ(s): Secondary | ICD-10-CM | POA: Diagnosis not present

## 2024-01-08 DIAGNOSIS — N400 Enlarged prostate without lower urinary tract symptoms: Secondary | ICD-10-CM | POA: Insufficient documentation

## 2024-01-08 DIAGNOSIS — C679 Malignant neoplasm of bladder, unspecified: Secondary | ICD-10-CM

## 2024-01-08 DIAGNOSIS — N183 Chronic kidney disease, stage 3 unspecified: Secondary | ICD-10-CM | POA: Diagnosis not present

## 2024-01-08 DIAGNOSIS — Z87891 Personal history of nicotine dependence: Secondary | ICD-10-CM | POA: Diagnosis not present

## 2024-01-08 LAB — CMP (CANCER CENTER ONLY)
ALT: 10 U/L (ref 0–44)
AST: 16 U/L (ref 15–41)
Albumin: 3.9 g/dL (ref 3.5–5.0)
Alkaline Phosphatase: 56 U/L (ref 38–126)
Anion gap: 6 (ref 5–15)
BUN: 23 mg/dL (ref 8–23)
CO2: 28 mmol/L (ref 22–32)
Calcium: 9.3 mg/dL (ref 8.9–10.3)
Chloride: 106 mmol/L (ref 98–111)
Creatinine: 1.5 mg/dL — ABNORMAL HIGH (ref 0.61–1.24)
GFR, Estimated: 46 mL/min — ABNORMAL LOW (ref >60)
Glucose, Bld: 98 mg/dL (ref 70–99)
Potassium: 4.7 mmol/L (ref 3.5–5.1)
Sodium: 140 mmol/L (ref 135–145)
Total Bilirubin: 0.7 mg/dL (ref 0.0–1.2)
Total Protein: 7.1 g/dL (ref 6.5–8.1)

## 2024-01-08 LAB — CBC WITH DIFFERENTIAL (CANCER CENTER ONLY)
Abs Immature Granulocytes: 0 K/uL (ref 0.00–0.07)
Basophils Absolute: 0 K/uL (ref 0.0–0.1)
Basophils Relative: 1 %
Eosinophils Absolute: 0.1 K/uL (ref 0.0–0.5)
Eosinophils Relative: 2 %
HCT: 42.7 % (ref 39.0–52.0)
Hemoglobin: 14.1 g/dL (ref 13.0–17.0)
Immature Granulocytes: 0 %
Lymphocytes Relative: 44 %
Lymphs Abs: 1.8 K/uL (ref 0.7–4.0)
MCH: 30.1 pg (ref 26.0–34.0)
MCHC: 33 g/dL (ref 30.0–36.0)
MCV: 91 fL (ref 80.0–100.0)
Monocytes Absolute: 0.4 K/uL (ref 0.1–1.0)
Monocytes Relative: 9 %
Neutro Abs: 1.8 K/uL (ref 1.7–7.7)
Neutrophils Relative %: 44 %
Platelet Count: 161 K/uL (ref 150–400)
RBC: 4.69 MIL/uL (ref 4.22–5.81)
RDW: 14.9 % (ref 11.5–15.5)
WBC Count: 4.1 K/uL (ref 4.0–10.5)
nRBC: 0 % (ref 0.0–0.2)

## 2024-01-08 NOTE — Progress Notes (Signed)
 [facility] CONSULT NOTE  Patient Care Team: Benjamine Aland, MD as PCP - General (Family Medicine) Kristie Lamprey, MD as Consulting Physician (Gastroenterology)  ASSESSMENT & PLAN:  Torence is a 83 y.o.male with history of BPH with TURP, CKD3, arthritis, hypothyroidism [facility].  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 The carcinoma involving adjacent prostatic ducts and stroma. CT reports negative for metastatic disease. Clinical cT4aN0M0 stage IIIA.  He has borderline renal function. CrCl of 36 from May of 2025. He is at risk for kidney failure if having cisplatin. He has no lymph node metastases yet but has disease with prostate invasion. Staging PET showed no distant metastases. He met with Dr. Alvaro afterwards and thought not a good candidate for surgery with cystoprostatectomy.  Follow up today.  We discussed options today. Discussed treatment and prognosis, side effects.  Chemoradiation with low-dose gemcitabine, versus EVP indefinitely as tolerated versus observation with repeat imaging and cystoscopy with resection as able.  We discussed different options, and side effects, efficacy etc.  Daughter was in the room.  I explained to him on potential side effects of chemoradiation, vs EV and pembrolizumab.  Likely treatments will result in side effects and toxicities as he seems to have no symptoms right now.   Response rate from chemoradiation decreased with higher stages. Alternatively, to preserve quality of life as much as possible, can continue with local resection as able.  Observation with surveillance imaging, cystoscopy and resection of local tumor may avoid early side effects from systemic treatment. Periodic imaging to assess for metastatic disease. If signs of progression, metastases, EVP can be initiated at the time. We discussed systemic treatment is a palliative management will  require long-term treatment, follow-up, infusion room visits, labs and imaging as well.  After discussion, we elected to consult radiation oncology to see if radiation can be considered. If so, will also trial of combine with low-dose gemcitabine.  Drug information on gemcitabine, enfortumab vedotin and pembrolizumab printed out today and given to the patient.  Patient will return after evaluation by radiation oncology Assessment & Plan Urothelial carcinoma of bladder (HCC) Evaluation for CRT If able, will give concurrent low-dose gemcitabine and weekly follow up with lab.  All questions were answered. The patient knows to call the clinic with any problems, questions or concerns. No barriers to learning was detected.  [name], MD 7/24/20253:41 PM  CHIEF COMPLAINTS/PURPOSE OF CONSULTATION:  UC  HISTORY OF PRESENTING ILLNESS:  Sebastain E Govea 83 y.o. male is here because of UC.  Treatment with Dr. Alvaro and he is not a candidate for surgery.  I have reviewed his chart and materials related to his cancer extensively and collaborated history with the patient. Summary of oncologic history is as follows:  Oncology History  Urothelial carcinoma of bladder (HCC)  09/14/2021 Pathology Results   Prostate, chips BENIGN NODULAR GLANDULAR AND STROMAL HYPERPLASIA MILD CHRONIC PROSTATITIS NEGATIVE FOR CARCINOMA   10/13/2023 Imaging   CT AP w/wo  At least 2 small enhancing mural nodules along the right lateral bladder wall suspicious for bladder carcinoma..  Markedly enlarged prostate gland which indents the bladder base. Asymmetric soft tissue density again seen protruding from the right lateral prostate base and right seminal vesicle. Prostate carcinoma with invasino of right seminal vesicle cannot be excluded.  No evidence of metastatic disease.  No evidenced of upper urinary tract neoplasm, urolithiasis or hydronephrosis.    10/31/2023 Pathology  Results   A. BLADDER, TUMOR, TURBT:   Infiltrating high grade papillary urothelial carcinoma with  micropapillary (40%) component  The carcinoma invades muscularis propria (detrusor muscle)   B. PROSTATE CHIPS, TURP:  High grade urothelial carcinoma  The carcinoma involving adjacent prostatic ducts and stroma    11/18/2023 Initial Diagnosis   Urothelial carcinoma of bladder [facility])     MEDICAL HISTORY:  Past Medical History:  Diagnosis Date   Anxiety    Cancer (HCC)    leukemia   Depression    Headache    Hypothyroidism    Pneumonia     SURGICAL HISTORY: Past Surgical History:  Procedure Laterality Date   ABDOMINAL SURGERY     HERNIA REPAIR     KNEE SURGERY Bilateral    PROSTATE BIOPSY     TRANSURETHRAL RESECTION OF BLADDER TUMOR N/A 10/31/2023   Procedure: TURBT (TRANSURETHRAL RESECTION OF BLADDER TUMOR);  Surgeon: Carolee Sherwood JONETTA DOUGLAS, MD;  Location: [facility];  Service: Urology;  Laterality: N/A;   TRANSURETHRAL RESECTION OF PROSTATE N/A 10/31/2023   Procedure: TURP (TRANSURETHRAL RESECTION OF PROSTATE);  Surgeon: Carolee Sherwood JONETTA DOUGLAS, MD;  Location: [facility];  Service: Urology;  Laterality: N/A;    SOCIAL HISTORY: Social History   Socioeconomic History   Marital status: Married    Spouse name: Not on file   Number of children: Not on file   Years of education: Not on file   Highest education level: Not on file  Occupational History   Not on file  Tobacco Use   Smoking status: Former    Current packs/day: 0.00    Types: Cigarettes    Quit date: 06/18/1983    Years since quitting: 40.5   Smokeless tobacco: Not on file  Vaping Use   Vaping status: Never Used  Substance and Sexual Activity   Alcohol use: No   Drug use: No   Sexual activity: Not on file  Other Topics Concern   Not on file  Social History Narrative   Not on file   Social Drivers of Health   Financial Resource Strain: Not on file  Food Insecurity: Not on file  Transportation Needs: Not on file  Physical Activity: Not on file   Stress: Not on file  Social Connections: Not on file  Intimate Partner Violence: Not on file    FAMILY HISTORY: Family History  Problem Relation Age of Onset   Hypertension Mother     ALLERGIES:  is allergic to acyclovir and related, amphotericin b, and aspirin.  MEDICATIONS:  Current Outpatient Medications  Medication Sig Dispense Refill   acetaminophen  (TYLENOL ) 500 MG tablet Take 500-1,000 mg by mouth every 6 (six) hours as needed for mild pain or headache.     Cholecalciferol (VITAMIN D3) 50 MCG (2000 UT) TABS Take 2,000 Units by mouth at bedtime.     docusate sodium (COLACE) 100 MG capsule Take 100 mg by mouth daily as needed for moderate constipation.     SYNTHROID 50 MCG tablet Take 50 mcg by mouth daily before breakfast.     No current facility-administered medications for this visit.    REVIEW OF SYSTEMS:   All relevant systems were reviewed with the patient and are negative.  PHYSICAL EXAMINATION: ECOG PERFORMANCE STATUS: 1 - Symptomatic but completely ambulatory  Vitals:   01/08/24 1338  BP: 130/87  Pulse: 85  Resp: 16  Temp: (!) 97.2 F (36.2 C)  SpO2: 98%    Filed Weights   01/08/24 1338  Weight: 145 lb 14.4 oz (66.2 kg)     GENERAL: alert, no distress and comfortable   LABORATORY DATA:  I have reviewed the data as listed Lab Results  Component Value Date   WBC 4.1 01/08/2024   HGB 14.1 01/08/2024   HCT 42.7 01/08/2024   MCV 91.0 01/08/2024   PLT 161 01/08/2024   Recent Labs    10/21/23 0930 11/19/23 1110 01/08/24 1313  NA 137 137 140  K 4.1 4.1 4.7  CL 105 101 106  CO2 22 28 28   GLUCOSE 100* 125* 98  BUN 29* 24* 23  CREATININE 1.37* 1.54* 1.50*  CALCIUM 9.3 9.3 9.3  GFRNONAA 51* 44* 46*  PROT  --  8.3* 7.1  ALBUMIN  --  4.1 3.9  AST  --  24 16  ALT  --  17 10  ALKPHOS  --  59 56  BILITOT  --  0.7 0.7    RADIOGRAPHIC STUDIES: I have personally reviewed the radiological images and new imaging ordered.

## 2024-01-08 NOTE — Telephone Encounter (Signed)
Scheduled appointments with the patient

## 2024-01-08 NOTE — Assessment & Plan Note (Addendum)
 Evaluation for CRT If able, will give concurrent low-dose gemcitabine and weekly follow up with lab.

## 2024-01-09 ENCOUNTER — Telehealth: Payer: Self-pay

## 2024-01-09 ENCOUNTER — Telehealth: Payer: Self-pay | Admitting: Radiation Oncology

## 2024-01-09 NOTE — Telephone Encounter (Signed)
 Called patient to schedule patient for a consultation w. Dr. Patrcia. Spoke w. Patient grand daughter who stated patient was not available. Advised her to notify patient to give our office a return call for scheduling.

## 2024-01-09 NOTE — Telephone Encounter (Signed)
 I called and spoke to grandson Dr. Karoline regarding his treatment options, including chemoradiation, systemic treatment with EVP, observation/surveillance planning in the future.  Discussed pros and cons, potential side effects.  He appreciates the call.  I let him know that he may call if any further questions.

## 2024-01-14 NOTE — Progress Notes (Signed)
 GU Location of Tumor / Histology: Urothelial carcinoma  of bladder  Kenneth Sanford presented as referral from Dr. Pauletta Chihuahua fro radiation consideration.  Biopsies    11/28/2023 Dr. Pauletta Chihuahua NM PET Image Initial (PI) Skull Base to Thigh CLINICAL DATA:  Initial treatment strategy for bladder malignancy. TURBT and TURP 10/31/2023.   IMPRESSION: 1. Mild right hydronephrosis and moderate right hydroureter extending down to the urinary bladder. 2. No findings of metastatic disease. 3. Very large prostate gland indents the bladder base, potentially with prior TURP. 4. Descending colon diverticulosis. 5. Small lax ventral hernia currently containing part of the sigmoid colon and small bowel. This may reflect some laxity in the central mesh. 6. Left mastoid effusion versus partial non pneumatization. 7.  Aortic Atherosclerosis (ICD10-I70.0).    Past/Anticipated interventions by urology, if any:  Dr. Sherwood BIRCH. Bell III   Past/Anticipated interventions by medical oncology, if any:  Dr. Pauletta Chihuahua     Weight changes, if any: {:18581}  Bowel/Bladder complaints, if any: {:18581}   Nausea/Vomiting, if any: {:18581}  Pain issues, if any:  {:18581}  SAFETY ISSUES: Prior radiation? {:18581} Pacemaker/ICD? {:18581} Possible current pregnancy? Male Is the patient on methotrexate? No  Current Complaints / other details:

## 2024-01-15 ENCOUNTER — Ambulatory Visit
Admission: RE | Admit: 2024-01-15 | Discharge: 2024-01-15 | Disposition: A | Discharge status: Home / Self Care | Source: Ambulatory Visit | Attending: Radiation Oncology | Admitting: Radiation Oncology

## 2024-01-15 ENCOUNTER — Encounter: Payer: Self-pay | Admitting: Radiation Oncology

## 2024-01-15 ENCOUNTER — Other Ambulatory Visit: Payer: Self-pay

## 2024-01-15 VITALS — BP 108/82 | HR 83 | Temp 98.1°F | Resp 16 | Ht 65.0 in | Wt 143.0 lb

## 2024-01-15 DIAGNOSIS — C678 Malignant neoplasm of overlapping sites of bladder: Secondary | ICD-10-CM | POA: Insufficient documentation

## 2024-01-15 DIAGNOSIS — E039 Hypothyroidism, unspecified: Secondary | ICD-10-CM | POA: Diagnosis not present

## 2024-01-15 DIAGNOSIS — C9111 Chronic lymphocytic leukemia of B-cell type in remission: Secondary | ICD-10-CM | POA: Diagnosis not present

## 2024-01-15 DIAGNOSIS — Z79899 Other long term (current) drug therapy: Secondary | ICD-10-CM | POA: Diagnosis not present

## 2024-01-15 DIAGNOSIS — Z87891 Personal history of nicotine dependence: Secondary | ICD-10-CM | POA: Insufficient documentation

## 2024-01-15 DIAGNOSIS — C679 Malignant neoplasm of bladder, unspecified: Secondary | ICD-10-CM

## 2024-01-15 NOTE — Progress Notes (Signed)
 START ON PATHWAY REGIMEN - Bladder   Gemcitabine 27 mg/m2 IV Twice Weekly + RT x 4 Weeks (Induction):   One induction cycle is 4 weeks:     Gemcitabine    Gemcitabine 27 mg/m2 IV Twice Weekly + RT x 2.5 Weeks (Consolidation):   One consolidation cycle is 2.5 weeks:     Gemcitabine   **Always confirm dose/schedule in your pharmacy ordering system**  Patient Characteristics: Pre-Cystectomy or Nonsurgical Candidate, M0 (Clinical Staging), cT2-4a, cN0-1, M0, Cystectomy Ineligible Therapeutic Status: Pre-Cystectomy or Nonsurgical Candidate, M0 (Clinical Staging) AJCC M Category: cM0 AJCC 8 Stage Grouping: IIIA AJCC T Category: cT4a AJCC N Category: cN0 Intent of Therapy: Curative Intent, Discussed with Patient

## 2024-01-15 NOTE — Progress Notes (Signed)
 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    ICD-10-CM   1. Urothelial carcinoma of bladder (HCC)  C67.9       HISTORY OF PRESENT ILLNESS: Kenneth Sanford is a 83 y.o. male seen at the request of Dr. Tina.  He is an established urology patient with a longstanding history of BPH with elevated PSA in 2019, no prior biopsy, followed by Dr. Carolee.  He developed gross hematuria and acute urinary retention in March 2023 and had a negative CT A/P and an office cystoscopy at that time.  More recently, he had recurrent microhematuria in February 2025 and a repeat in office cystoscopy at that time showed some papillary changes in the prostatic urethra and multiple bladder tumors on the right lateral wall.  A TURBT with maximal resection was performed 10/31/2023.  Final surgical pathology confirmed infiltrating high-grade muscle invasive papillary urothelial carcinoma with micropapillary component (40%) involving the adjacent prostatic stroma and ducts.  He met with Dr. Tina in consult on 11/19/2023 and had a PET scan on 11/28/2023.  Fortunately, there was no evidence of metastatic disease.  He met with Dr. Alvaro on 12/15/2023 to discuss potential radical cystoprostatectomy but was not felt to be a good candidate given his advanced age, multiple medical comorbidities and previous extensive abdominal surgery.  Therefore, he has been referred to us  today to discuss the potential option for bladder sparing definitive treatment with chemoradiation.  He is accompanied by his daughter for today's visit.  PREVIOUS  RADIATION THERAPY: No  PAST MEDICAL HISTORY:  Past Medical History:  Diagnosis Date   Anxiety    Cancer (HCC)    leukemia   Depression    Headache    Hypothyroidism    Pneumonia       PAST SURGICAL HISTORY: Past Surgical History:  Procedure Laterality Date   ABDOMINAL SURGERY     HERNIA REPAIR     KNEE SURGERY Bilateral    PROSTATE BIOPSY     TRANSURETHRAL RESECTION OF BLADDER TUMOR N/A 10/31/2023   Procedure: TURBT (TRANSURETHRAL RESECTION OF BLADDER TUMOR);  Surgeon: Carolee Sherwood JONETTA DOUGLAS, MD;  Location: [facility];  Service: Urology;  Laterality: N/A;   TRANSURETHRAL RESECTION OF PROSTATE N/A 10/31/2023   Procedure: TURP (TRANSURETHRAL RESECTION OF PROSTATE);  Surgeon: Carolee Sherwood JONETTA DOUGLAS, MD;  Location: [facility];  Service: Urology;  Laterality: N/A;    FAMILY HISTORY:  Family History  Problem Relation Age of Onset   Hypertension Mother     SOCIAL HISTORY:  Social History   Socioeconomic History   Marital status: Married    Spouse name: Not on file   Number of children: Not on file   Years of education: Not on file   Highest education level: Not on file  Occupational History   Not on file  Tobacco Use   Smoking status: Former    Current packs/day: 0.00    Types: Cigarettes    Quit date: 06/18/1983    Years since quitting: 40.6   Smokeless tobacco: Not on file  Vaping Use   Vaping status: Never Used  Substance and Sexual Activity   Alcohol use: No   Drug use: No   Sexual activity: Not on file  Other Topics Concern   Not on file  Social History Narrative   Not on file   Social Drivers of Health   Financial Resource Strain: Not on file  Food Insecurity: Not on file  Transportation Needs: Not on file  Physical Activity: Not on file  Stress: Not on file  Social Connections: Not on file  Intimate Partner Violence: Not on file    ALLERGIES: Acyclovir and related, Amphotericin b, and Aspirin  MEDICATIONS:  Current Outpatient Medications  Medication Sig  Dispense Refill   acetaminophen  (TYLENOL ) 500 MG tablet Take 500-1,000 mg by mouth every 6 (six) hours as needed for mild pain or headache.     Cholecalciferol (VITAMIN D3) 50 MCG (2000 UT) TABS Take 2,000 Units by mouth at bedtime.     docusate sodium (COLACE) 100 MG capsule Take 100 mg by mouth daily as needed for moderate constipation.     SYNTHROID 50 MCG tablet Take 50 mcg by mouth daily before breakfast.     tamsulosin (FLOMAX) 0.4 MG CAPS capsule Tamsulosin HCl     No current facility-administered medications for this encounter.    REVIEW OF SYSTEMS:  On review of systems, the patient reports that he is doing well overall. He denies any chest pain, shortness of breath, cough, fevers, chills, night sweats, unintended weight changes. He denies any bowel or bladder disturbances, and denies abdominal pain, nausea or vomiting. He denies any new musculoskeletal or joint aches or pains. A complete review of systems is obtained and is otherwise negative.    PHYSICAL EXAM:  Wt Readings from Last 3 Encounters:  01/15/24 143 lb (64.9 kg)  01/08/24 145 lb 14.4 oz (66.2 kg)  11/19/23 140 lb 1.6 oz (63.5 kg)   Temp Readings from Last 3 Encounters:  01/15/24 98.1 F (36.7 C) (Oral)  01/08/24 (!) 97.2 F (36.2 C) (Temporal)  11/19/23 97.8 F (36.6 C) (Temporal)   BP Readings from Last 3 Encounters:  01/15/24 108/82  01/08/24 130/87  11/19/23 110/87   Pulse Readings from Last 3 Encounters:  01/15/24 83  01/08/24 85  11/19/23 (!) 105    /10  In general this is a well appearing African American man in no acute distress. He's alert and oriented x4 and appropriate throughout the examination. Cardiopulmonary assessment is negative for acute distress and he exhibits normal effort.   KPS = 90  100 - Normal; no complaints; no evidence of disease. 90   - Able to carry on normal activity; minor signs or symptoms of disease. 80   - Normal activity with effort; some signs or symptoms of  disease. 35   - Cares for self; unable to carry on normal activity or to do active work. 60   - Requires occasional assistance, but is able to care for most of his personal needs. 50   - Requires considerable assistance and frequent medical care. 40   - Disabled; requires special care and assistance. 30   - Severely disabled; hospital admission is indicated although death not imminent. 20   - Very sick; hospital admission necessary; active supportive treatment necessary. 10   - Moribund; fatal processes progressing rapidly. 0     - Dead  Karnofsky DA, Abelmann WH, Craver LS and Burchenal [facility] [phone]) The use of the nitrogen mustards in the palliative treatment of carcinoma: with particular reference to bronchogenic carcinoma  Cancer 1 [phone]  LABORATORY DATA:  Lab Results  Component Value Date   WBC 4.1 01/08/2024   HGB 14.1 01/08/2024   HCT 42.7 01/08/2024   MCV 91.0 01/08/2024   PLT 161 01/08/2024   Lab Results  Component Value Date   NA 140 01/08/2024   K 4.7 01/08/2024   CL 106 01/08/2024   CO2 28 01/08/2024   Lab Results  Component Value Date   ALT 10 01/08/2024   AST 16 01/08/2024   ALKPHOS 56 01/08/2024   BILITOT 0.7 01/08/2024     RADIOGRAPHY: No results found.    IMPRESSION/PLAN: 1. 83 y.o. man with T4aNoMo muscle invasive HG papillary urothelial carcinoma involving the adjacent prostatic stroma and ducts.  Today, we talked to the patient and family (daughter in person and grandson by phone) about the findings and workup thus far. We discussed the natural history of locally advanced muscle invasive urothelial carcinoma and general treatment, highlighting the role of radiotherapy in the management. Although the gold standard for treatment of his disease is radical cystoprostatectomy, he is not felt to be a surgical candidate given his advanced age and prior abdominal surgeries so the recommendation is for bladder sparring chemoradiation. We discussed the available  radiation techniques, and focused on the details and logistics of delivery. The recommendation is for a 7 week course of daily external beam therapy to the bladder and prostate concurrent with weekly gemcitabine systemic therapy.  We reviewed the anticipated acute and late sequelae associated with radiation in this setting. The patient and his family were encouraged to ask questions that were answered to their stated satisfaction.  At the conclusion of our conversation, the patient is in agreement to proceed with the recommended 7 week course of daily external beam therapy to the bladder and prostate concurrent with weekly gemcitabine systemic therapy. He has freely signed written consent to proceed today in the office and copy of this document will be placed in his medical record. We have tentatively scheduled him for CT Simulation/treatment planning at 9am on Thursday 01/22/24, in anticipation of beginning treatments in the near future, coordinated with the start of chemotherapy. He has not had a PSA drawn since 2019 so we will update the PSA lab on 01/22/24 for a current baseline. He appears to have a good understanding of his disease and our treatment recommendations which are of curative intent. We will share our discussion with Dr. Tina and Dr. Carolee and proceed with treatment planning accordingly. They know that they are welcome to call with any additional questions or concerns in the interim. We enjoyed meeting him and his family today and look forward to continuing to participate in his care.   We personally spent 70 minutes in this encounter including chart review, reviewing radiological studies, meeting face-to-face with the patient, entering orders and completing documentation.    Sabra MICAEL Rusk, PA-C    Donnice Barge, MD  [facility]  Radiation Oncology Direct Dial: [phone]  Fax: [phone] [email or web address]  Skype  LinkedIn

## 2024-01-15 NOTE — Progress Notes (Signed)
 Low dose gem with RT ordered. Pending start. Will need teaching

## 2024-01-16 ENCOUNTER — Telehealth: Payer: Self-pay

## 2024-01-16 DIAGNOSIS — C679 Malignant neoplasm of bladder, unspecified: Secondary | ICD-10-CM

## 2024-01-16 MED ORDER — ONDANSETRON HCL 8 MG PO TABS: 8.0000 mg | ORAL_TABLET | Freq: Three times a day (TID) | ORAL | 1 refills | Status: DC | PRN

## 2024-01-16 MED ORDER — PROCHLORPERAZINE MALEATE 10 MG PO TABS: 10.0000 mg | ORAL_TABLET | Freq: Four times a day (QID) | ORAL | 1 refills | Status: DC | PRN

## 2024-01-16 MED ORDER — LIDOCAINE-PRILOCAINE 2.5-2.5 % EX CREA: TOPICAL_CREAM | CUTANEOUS | 3 refills | Status: AC

## 2024-01-16 NOTE — Telephone Encounter (Signed)
 Scheduled and confirmed appointments with the patients daughter.

## 2024-01-16 NOTE — Telephone Encounter (Signed)
 Received message from radiation oncology, planning on starting radiation on the week of 8/18.  Ordered low-dose gemcitabine to start with radiation on the same week.  Talked to patient and also recommend mediport. Discussed follow up plan and reassess on weekly basis once starting treatment.  Called and spoke to patient's grandson as he is part of his care.  Spoke to Dr. Karoline regarding chemoradiation.  Discussed low-dose gemcitabine. He says can call his mom, who is Mr. Henk daughter for appointment.  Will schedule for treatment with gemcitabine twice weekly, lab and MD/APP visit once weekly

## 2024-01-16 NOTE — Telephone Encounter (Signed)
 Left the patient a voicemail with the education appointment details.

## 2024-01-16 NOTE — Telephone Encounter (Signed)
 Left the patient another voicemail with the scheduled appointments.

## 2024-01-17 ENCOUNTER — Other Ambulatory Visit: Payer: Self-pay

## 2024-01-20 ENCOUNTER — Other Ambulatory Visit: Payer: Self-pay

## 2024-01-21 ENCOUNTER — Other Ambulatory Visit: Payer: Self-pay

## 2024-01-21 NOTE — Progress Notes (Signed)
  Radiation Oncology         [phone] ________________________________  Name: CLEMENCE STILLINGS MRN: [id number]  Date: 01/22/2024  DOB: Nov 07, 1940  SIMULATION AND TREATMENT PLANNING NOTE    ICD-10-CM   1. 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   NARRATIVE:  The patient was brought to the CT Simulation planning suite.  Identity was confirmed.  All relevant records and images related to the planned course of therapy were reviewed.  The patient freely provided informed written consent to proceed with treatment after reviewing the details related to the planned course of therapy. The consent form was witnessed and verified by the simulation staff.  Then, the patient was set-up in a stable reproducible  supine position for radiation therapy.  Contrast was instilled into the bladder with a catheter under sterile conditions.  CT images were obtained.  Surface markings were placed.  The CT images were loaded into the planning software.  Then the target and avoidance structures were contoured.  Treatment planning then occurred.  The radiation prescription was entered and confirmed.  Then, I designed and supervised the construction of a total of 5 medically necessary complex treatment devices including VacLoc body positioner and 4 MLCs to shield the bowel and femoral necks.  I have requested : 3D Simulation  I have requested a DVH of the following structures: small bowel, rectum, left femoral head, right femoral head and targets.  SPECIAL TREATMENT PROCEDURE:  The planned course of therapy using radiation constitutes a special treatment procedure. Special care is required in the management of this patient for the following reasons. This treatment constitutes a Special Treatment Procedure for the following reason: [ Concurrent  chemotherapy requiring careful monitoring for increased toxicities of treatment including weekly laboratory values..  The special nature of the planned course of radiotherapy will require increased physician supervision and oversight to ensure patient's safety with optimal treatment outcomes.  PLAN:  The patient will receive 19.8 Gy in 11 fractions of 1.8 Gy to the bladder tumor with full bladder, then 45 Gy in 25 fractions to the whole bladder and pelvic nodes to a total dose of 64.8 Gy concurrent with low dose gemcitabine.  ________________________________  Donnice FELIX Patrcia, M.D.

## 2024-01-22 ENCOUNTER — Ambulatory Visit
Admission: RE | Admit: 2024-01-22 | Discharge: 2024-01-22 | Disposition: A | Discharge status: Home / Self Care | Source: Ambulatory Visit | Attending: Radiation Oncology | Admitting: Radiation Oncology

## 2024-01-22 ENCOUNTER — Other Ambulatory Visit: Payer: Self-pay | Admitting: Radiology

## 2024-01-22 DIAGNOSIS — Z510 Encounter for antineoplastic radiation therapy: Secondary | ICD-10-CM | POA: Insufficient documentation

## 2024-01-22 DIAGNOSIS — Z79899 Other long term (current) drug therapy: Secondary | ICD-10-CM | POA: Diagnosis not present

## 2024-01-22 DIAGNOSIS — C679 Malignant neoplasm of bladder, unspecified: Secondary | ICD-10-CM

## 2024-01-22 DIAGNOSIS — C672 Malignant neoplasm of lateral wall of bladder: Secondary | ICD-10-CM | POA: Diagnosis not present

## 2024-01-22 DIAGNOSIS — Z5111 Encounter for antineoplastic chemotherapy: Secondary | ICD-10-CM | POA: Diagnosis not present

## 2024-01-22 NOTE — H&P (Signed)
 Chief Complaint: Bladder cancer; referred for Port-A-Cath placement to assist with treatment  Referring Provider(s): Chang,R  Supervising Physician: Jenna Hacker  Patient Status: [facility] - Out-pt  History of Present Illness: Kenneth Sanford is an 83 y.o. male ex smoker with past medical history significant for anxiety, leukemia, depression, headache, hypothyroidism, BPH with prior TURP, chronic kidney disease, arthritis who presents now with newly diagnosed bladder carcinoma with adjacent prostatic ducts and stroma involvement.  He is scheduled today for Port-A-Cath placement to assist with treatment.  *** Patient is Full Code  Past Medical History:  Diagnosis Date   Anxiety    Cancer (HCC)    leukemia   Depression    Headache    Hypothyroidism    Pneumonia     Past Surgical History:  Procedure Laterality Date   ABDOMINAL SURGERY     HERNIA REPAIR     KNEE SURGERY Bilateral    PROSTATE BIOPSY     TRANSURETHRAL RESECTION OF BLADDER TUMOR N/A 10/31/2023   Procedure: TURBT (TRANSURETHRAL RESECTION OF BLADDER TUMOR);  Surgeon: Carolee Sherwood JONETTA DOUGLAS, MD;  Location: [facility];  Service: Urology;  Laterality: N/A;   TRANSURETHRAL RESECTION OF PROSTATE N/A 10/31/2023   Procedure: TURP (TRANSURETHRAL RESECTION OF PROSTATE);  Surgeon: Carolee Sherwood JONETTA DOUGLAS, MD;  Location: [facility];  Service: Urology;  Laterality: N/A;    Allergies: Acyclovir and related, Amphotericin b, and Aspirin  Medications: Prior to Admission medications   Medication Sig Start Date End Date Taking? Authorizing Provider  acetaminophen  (TYLENOL ) 500 MG tablet Take 500-1,000 mg by mouth every 6 (six) hours as needed for mild pain or headache. Patient not taking: Reported on 01/15/2024    [provider]  Cholecalciferol (VITAMIN D3) 50 MCG (2000 UT) TABS Take 2,000 Units by mouth at bedtime.    [provider]  docusate sodium (COLACE) 100 MG capsule Take 100 mg by mouth daily as needed for moderate  constipation. Patient not taking: Reported on 01/15/2024    [provider]  lidocaine -prilocaine  (EMLA ) cream Apply to affected area once 01/16/24   Tina Pauletta BROCKS, MD  ondansetron  (ZOFRAN ) 8 MG tablet Take 1 tablet (8 mg total) by mouth every 8 (eight) hours as needed for nausea or vomiting. 01/16/24   Tina Pauletta BROCKS, MD  prochlorperazine  (COMPAZINE ) 10 MG tablet Take 1 tablet (10 mg total) by mouth every 6 (six) hours as needed for nausea or vomiting. 01/16/24   Tina Pauletta BROCKS, MD  SYNTHROID 50 MCG tablet Take 50 mcg by mouth daily before breakfast.    [provider]  tamsulosin (FLOMAX) 0.4 MG CAPS capsule Tamsulosin HCl Patient not taking: Reported on 01/15/2024    [provider]     Family History  Problem Relation Age of Onset   Hypertension Mother     Social History   Socioeconomic History   Marital status: Married    Spouse name: Not on file   Number of children: Not on file   Years of education: Not on file   Highest education level: Not on file  Occupational History   Not on file  Tobacco Use   Smoking status: Former    Current packs/day: 0.00    Types: Cigarettes    Quit date: 06/18/1983    Years since quitting: 40.6   Smokeless tobacco: Never  Vaping Use   Vaping status: Never Used  Substance and Sexual Activity   Alcohol use: No   Drug use: No   Sexual  activity: Not on file  Other Topics Concern   Not on file  Social History Narrative   Not on file   Social Drivers of Health   Financial Resource Strain: Not on file  Food Insecurity: No Food Insecurity (01/15/2024)   Hunger Vital Sign    Worried About Running Out of Food in the Last Year: Never true    Ran Out of Food in the Last Year: Never true  Transportation Needs: No Transportation Needs (01/15/2024)   PRAPARE - Administrator, Civil Service (Medical): No    Lack of Transportation (Non-Medical): No  Physical Activity: Not on file  Stress: Not on file  Social  Connections: Not on file       Review of Systems  Vital Signs:   Advance Care Plan: no documents on file.  Physical Exam  Imaging: No results found.  Labs:  CBC: Recent Labs    10/21/23 0930 01/08/24 1313  WBC 3.5* 4.1  HGB 15.3 14.1  HCT 46.4 42.7  PLT 184 161    COAGS: No results for input(s): INR, APTT in the last 8760 hours.  BMP: Recent Labs    10/21/23 0930 11/19/23 1110 01/08/24 1313  NA 137 137 140  K 4.1 4.1 4.7  CL 105 101 106  CO2 22 28 28   GLUCOSE 100* 125* 98  BUN 29* 24* 23  CALCIUM 9.3 9.3 9.3  CREATININE 1.37* 1.54* 1.50*  GFRNONAA 51* 44* 46*    LIVER FUNCTION TESTS: Recent Labs    11/19/23 1110 01/08/24 1313  BILITOT 0.7 0.7  AST 24 16  ALT 17 10  ALKPHOS 59 56  PROT 8.3* 7.1  ALBUMIN 4.1 3.9    TUMOR MARKERS: No results for input(s): AFPTM, CEA, CA199, CHROMGRNA in the last 8760 hours.  Assessment and Plan: 83 y.o. male ex smoker with past medical history significant for anxiety, leukemia, depression, headache, hypothyroidism, BPH with prior TURP, chronic kidney disease, arthritis who presents now with newly diagnosed bladder carcinoma with adjacent prostatic ducts and stroma involvement.  He is scheduled today for Port-A-Cath placement to assist with treatment.Risks and benefits of image guided port-a-catheter placement was discussed with the patient including, but not limited to bleeding, infection, pneumothorax, or fibrin sheath development and need for additional procedures.  All of the patient's questions were answered, patient is agreeable to proceed. Consent signed and in chart.    Thank you for allowing our service to participate in HARLEY FITZWATER 's care.  Electronically Signed: D. Franky Rakers, PA-C   01/22/2024, 4:55 PM      I spent a total of  20 minutes   in face to face in clinical consultation, greater than 50% of which was counseling/coordinating care for Port-A-Cath placement

## 2024-01-23 ENCOUNTER — Encounter (HOSPITAL_COMMUNITY): Payer: Self-pay

## 2024-01-23 ENCOUNTER — Ambulatory Visit (HOSPITAL_COMMUNITY)
Admission: RE | Admit: 2024-01-23 | Discharge: 2024-01-23 | Disposition: A | Discharge status: Home / Self Care | Source: Ambulatory Visit

## 2024-01-23 ENCOUNTER — Other Ambulatory Visit: Payer: Self-pay

## 2024-01-23 DIAGNOSIS — E039 Hypothyroidism, unspecified: Secondary | ICD-10-CM | POA: Insufficient documentation

## 2024-01-23 DIAGNOSIS — Z87891 Personal history of nicotine dependence: Secondary | ICD-10-CM | POA: Insufficient documentation

## 2024-01-23 DIAGNOSIS — N400 Enlarged prostate without lower urinary tract symptoms: Secondary | ICD-10-CM | POA: Insufficient documentation

## 2024-01-23 DIAGNOSIS — F32A Depression, unspecified: Secondary | ICD-10-CM | POA: Insufficient documentation

## 2024-01-23 DIAGNOSIS — N189 Chronic kidney disease, unspecified: Secondary | ICD-10-CM | POA: Diagnosis not present

## 2024-01-23 DIAGNOSIS — C679 Malignant neoplasm of bladder, unspecified: Secondary | ICD-10-CM | POA: Diagnosis not present

## 2024-01-23 DIAGNOSIS — F419 Anxiety disorder, unspecified: Secondary | ICD-10-CM | POA: Insufficient documentation

## 2024-01-23 HISTORY — PX: IR IMAGING GUIDED PORT INSERTION: IMG5740

## 2024-01-23 MED ORDER — FENTANYL CITRATE (PF) 100 MCG/2ML IJ SOLN
INTRAMUSCULAR | Status: AC | PRN
  Administered 2024-01-23: 25 ug via INTRAVENOUS

## 2024-01-23 MED ORDER — LIDOCAINE-EPINEPHRINE 1 %-1:100000 IJ SOLN
INTRAMUSCULAR | Status: AC
  Filled 2024-01-23: qty 1

## 2024-01-23 MED ORDER — MIDAZOLAM HCL 2 MG/2ML IJ SOLN
INTRAMUSCULAR | Status: AC
  Filled 2024-01-23: qty 2

## 2024-01-23 MED ORDER — LIDOCAINE-EPINEPHRINE 1 %-1:100000 IJ SOLN
20.0000 mL | Freq: Once | INTRAMUSCULAR | Status: AC
  Administered 2024-01-23: 10 mL via INTRADERMAL

## 2024-01-23 MED ORDER — HEPARIN SOD (PORK) LOCK FLUSH 100 UNIT/ML IV SOLN
500.0000 [IU] | Freq: Once | INTRAVENOUS | Status: AC
  Administered 2024-01-23: 500 [IU] via INTRAVENOUS

## 2024-01-23 MED ORDER — HEPARIN SOD (PORK) LOCK FLUSH 100 UNIT/ML IV SOLN
INTRAVENOUS | Status: AC
Start: 2024-01-23 — End: 2024-01-23
  Filled 2024-01-23: qty 5

## 2024-01-23 MED ORDER — FENTANYL CITRATE (PF) 100 MCG/2ML IJ SOLN
INTRAMUSCULAR | Status: AC
  Filled 2024-01-23: qty 2

## 2024-01-23 MED ORDER — MIDAZOLAM HCL 2 MG/2ML IJ SOLN
INTRAMUSCULAR | Status: AC | PRN
Start: 2024-01-23 — End: 2024-01-23
  Administered 2024-01-23: .5 mg via INTRAVENOUS

## 2024-01-23 MED ORDER — SODIUM CHLORIDE 0.9 % IV SOLN: INTRAVENOUS | Status: DC

## 2024-01-23 NOTE — Discharge Instructions (Signed)
 Moderate Conscious Sedation-Care After  This sheet gives you information about how to care for yourself after your procedure. Your health care provider may also give you more specific instructions. If you have problems or questions, contact your health care provider.  After the procedure, it is common to have: Sleepiness for several hours. Impaired judgment for several hours. Difficulty with balance. Vomiting if you eat too soon.  Follow these instructions at home:  Rest. Do not participate in activities where you could fall or become injured. Do not drive or use machinery. Do not drink alcohol. Do not take sleeping pills or medicines that cause drowsiness. Do not make important decisions or sign legal documents. Do not take care of children on your own.  Eating and drinking Follow the diet recommended by your health care provider. Drink enough fluid to keep your urine pale yellow. If you vomit: Drink water, juice, or soup when you can drink without vomiting. Make sure you have little or no nausea before eating solid foods.  General instructions Take over-the-counter and prescription medicines only as told by your health care provider. Have a responsible adult stay with you for the time you are told. It is important to have someone help care for you until you are awake and alert. Do not smoke. Keep all follow-up visits as told by your health care provider. This is important.  Contact a health care provider if: You are still sleepy or having trouble with balance after 24 hours. You feel light-headed. You keep feeling nauseous or you keep vomiting. You develop a rash. You have a fever. You have redness or swelling around the IV site.  Get help right away if: You have trouble breathing. You have new-onset confusion at home.  This information is not intended to replace advice given to you by your health care provider. Make sure you discuss any questions you have with your  healthcare provider.

## 2024-01-23 NOTE — Procedures (Signed)
 Interventional Radiology Procedure Note  Procedure: chest port placement  Complications: None  Estimated Blood Loss: < 10 mL  Findings: RIJ SL chest port placement.  Cordella DELENA Banner, MD

## 2024-01-26 NOTE — Progress Notes (Signed)
/  Pharmacist Chemotherapy Monitoring - Initial Assessment    Anticipated start date: 02/03/24  The following has been reviewed per standard work regarding the patient's treatment regimen: The patient's diagnosis, treatment plan and drug doses, and organ/hematologic function Lab orders and baseline tests specific to treatment regimen  The treatment plan start date, drug sequencing, and pre-medications Prior authorization status  Patient's documented medication list, including drug-drug interaction screen and prescriptions for anti-emetics and supportive care specific to the treatment regimen The drug concentrations, fluid compatibility, administration routes, and timing of the medications to be used The patient's access for treatment and lifetime cumulative dose history, if applicable  The patient's medication allergies and previous infusion related reactions, if applicable   Changes made to treatment plan:  N/A  Follow up needed:  N/A   Kenneth Sanford, PharmD, MBA

## 2024-01-28 ENCOUNTER — Inpatient Hospital Stay

## 2024-01-28 DIAGNOSIS — Z510 Encounter for antineoplastic radiation therapy: Secondary | ICD-10-CM | POA: Diagnosis not present

## 2024-01-28 DIAGNOSIS — C672 Malignant neoplasm of lateral wall of bladder: Secondary | ICD-10-CM | POA: Diagnosis not present

## 2024-01-28 DIAGNOSIS — C679 Malignant neoplasm of bladder, unspecified: Secondary | ICD-10-CM | POA: Diagnosis not present

## 2024-01-28 DIAGNOSIS — Z79899 Other long term (current) drug therapy: Secondary | ICD-10-CM | POA: Diagnosis not present

## 2024-01-28 DIAGNOSIS — Z5111 Encounter for antineoplastic chemotherapy: Secondary | ICD-10-CM | POA: Diagnosis not present

## 2024-02-02 ENCOUNTER — Inpatient Hospital Stay

## 2024-02-02 NOTE — Progress Notes (Unsigned)
  [facility]cer Center OFFICE PROGRESS NOTE  Patient Care Team: Benjamine Aland, MD as PCP - General (Family Medicine) Kristie Lamprey, MD as Consulting Physician (Gastroenterology)  Kenneth Sanford is a 83 y.o.male with history of BPH with TURP, CKD3, arthritis, hypothyroidism [facility].   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 The carcinoma involving adjacent prostatic ducts and stroma. CT reports negative for metastatic disease. Clinical cT4aN0M0 stage IIIA.  After discussion he elected on CRT.  Plan on six weeks of low dose gemcitabine . Assessment & Plan Urothelial carcinoma of bladder (HCC) Day 1 today Return twice weekly for chemotherapy Follow up with labs and MD weekly   Kenneth JAYSON Chihuahua, MD  INTERVAL HISTORY: Patient returns for follow-up.  Oncology History  Urothelial carcinoma of bladder (HCC)  09/14/2021 Pathology Results   Prostate, chips BENIGN NODULAR GLANDULAR AND STROMAL HYPERPLASIA MILD CHRONIC PROSTATITIS NEGATIVE FOR CARCINOMA   09/15/2023 Cancer Staging   Staging form: Urinary Bladder, AJCC 8th Edition - Clinical stage from 09/15/2023: Stage IIIA (cT4a, cN0, cM0) - Signed by Sherwood Rise, PA-C on 01/15/2024 Stage prefix: Initial diagnosis WHO/ISUP grade (low/high): High Grade Histologic grading system: 2 grade system   10/13/2023 Imaging   CT AP w/wo  At least 2 small enhancing mural nodules along the right lateral bladder wall suspicious for bladder carcinoma..  Markedly enlarged prostate gland which indents the bladder base. Asymmetric soft tissue density again seen protruding from the right lateral prostate base and right seminal vesicle. Prostate carcinoma with invasino of right seminal vesicle cannot be excluded.  No evidence of metastatic disease.  No evidenced of upper urinary tract neoplasm, urolithiasis or hydronephrosis.    10/31/2023 Pathology Results   A.  BLADDER, TUMOR, TURBT:  Infiltrating high grade papillary urothelial carcinoma with  micropapillary (40%) component  The carcinoma invades muscularis propria (detrusor muscle)   B. PROSTATE CHIPS, TURP:  High grade urothelial carcinoma  The carcinoma involving adjacent prostatic ducts and stroma    11/18/2023 Initial Diagnosis   Urothelial carcinoma of bladder (HCC)   02/03/2024 -  Chemotherapy   Patient is on Treatment Plan : BLADDER Gemcitabine  Twice Weekly + XRT        PHYSICAL EXAMINATION: ECOG PERFORMANCE STATUS: 1 - Symptomatic but completely ambulatory  VSS  GENERAL: alert, no distress and comfortable LUNGS: clear to auscultation and percussion with normal breathing effort HEART: regular rate & rhythm   Relevant data reviewed during this visit included labs.

## 2024-02-02 NOTE — Progress Notes (Signed)
 [facility] Clinical Social Work  Initial Assessment   Kenneth Sanford is a 83 y.o. year old male contacted by phone. Clinical Social Work was referred by distress screen protocol for distress screen needs and assessment of psychosocial needs.   SDOH (Social Determinants of Health) assessments performed: Yes   SDOH Screenings   Food Insecurity: No Food Insecurity (02/02/2024)  Housing: Low Risk  (02/02/2024)  Transportation Needs: No Transportation Needs (01/15/2024)  Utilities: Not At Risk (02/02/2024)  Alcohol Screen: Low Risk  (01/15/2024)  Depression (PHQ2-9): Low Risk  (02/02/2024)  Tobacco Use: Medium Risk (01/23/2024)    PHQ 2/9:    02/02/2024   10:00 AM 01/15/2024    3:36 PM  Depression screen PHQ 2/9  Decreased Interest 0 0  Down, Depressed, Hopeless 0 3  PHQ - 2 Score 0 3     Distress Screen completed: Yes    01/28/2024    1:09 PM  ONCBCN DISTRESS SCREENING  Screening Type Initial Screening  How much distress have you been experiencing in the past week? (0-10) 10  Social concerns type Relationship with spouse or partner  Emotional concerns type Worry or anxiety      Family/Social Information:  Housing Arrangement: Patient lives alone in [place]. Family members/support persons in your life? Patient named his daughters (2 and 19) and his granddaughter (86) Transportation concerns: Patient will be transported to some of his appointments by his daughters. The appointments his daughters are unable to provide transportation, patient will drive himself. Patient stated he is comfortable with transporting himself to appointments.  Employment: Retired Patient is retired. 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 Patient discussed becoming a vital part of the students day to day routine. It's apparent patient enjoyed serving in this role. 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  Type of concern: None Food access concerns: no reported food access concerns.  Religious or spiritual practice: Yes-Patient identifies as [facility]ator [facility] denomination). Patient stated his faith is a vital part of him coping with his diagnosis. Patient stated he used to be heavily involved with his church, this has since declined following his wife's admission to local facility.  Advanced directives: No, patient does not have a Advance Directive on file. Patient dicussed barriers (life events) to completing the document.  Services Currently in place:  Transportation, family, Income, Housing  Coping/ Adjustment to diagnosis: Patient understands treatment plan and what happens next? yes, patient understands he will complete cocurrent chemoradiation. Concerns about diagnosis and/or treatment: Patient's biggest worry is physical abilities. Patient's family, specifically his wife are very important to him. Patient wants to continue to visit with his wife everyday.  Patient reported stressors: Adjusting to my illness and Physical issues Hopes and/or priorities: For treatment to be successful and he can continue his current daily life activities.  Patient enjoys At the present moment, patient does not have any hobbies. Patient stated he enjoys resting at home, watching TV, and visiting his wife. Patient stated he used to be active in [employer] and in his church. Current coping skills/ strengths: Ability for insight , Active sense of humor , Average or above average intelligence , Capable of independent living , Communication skills , Financial means , General fund of knowledge , Motivation for treatment/growth , Religious Affiliation , and Supportive family/friends     SUMMARY: Current SDOH Barriers:  No SDOH barriers identified.  Clinical Social Work Clinical Goal(s):  Patient will work with SW to address concerns related to adjustment to diagnosis.    Interventions: Discussed common feeling and emotions when being diagnosed with cancer, and the importance of support during treatment Informed patient of the support team roles and support services at [facility] Provided CSW contact information and encouraged patient to call with any questions or concerns Provided brief mental health counseling with regard to adjustment to diagnosis.   Provided education and assistance to client regarding Advanced Directives.    Follow Up Plan: CSW will see patient on 8/29 at his infusion treatment. Patient verbalizes understanding of plan: Yes   Lizbeth Sprague, LCSW Clinical Social Worker [facility]

## 2024-02-03 ENCOUNTER — Inpatient Hospital Stay

## 2024-02-03 ENCOUNTER — Other Ambulatory Visit: Payer: Self-pay

## 2024-02-03 ENCOUNTER — Other Ambulatory Visit

## 2024-02-03 ENCOUNTER — Inpatient Hospital Stay (HOSPITAL_BASED_OUTPATIENT_CLINIC_OR_DEPARTMENT_OTHER)

## 2024-02-03 ENCOUNTER — Ambulatory Visit
Admission: RE | Admit: 2024-02-03 | Discharge: 2024-02-03 | Disposition: A | Discharge status: Home / Self Care | Source: Ambulatory Visit | Attending: Radiation Oncology | Admitting: Radiation Oncology

## 2024-02-03 VITALS — BP 114/69 | HR 79 | Temp 97.7°F | Resp 16 | Wt 143.8 lb

## 2024-02-03 DIAGNOSIS — Z79899 Other long term (current) drug therapy: Secondary | ICD-10-CM | POA: Diagnosis not present

## 2024-02-03 DIAGNOSIS — C672 Malignant neoplasm of lateral wall of bladder: Secondary | ICD-10-CM | POA: Diagnosis not present

## 2024-02-03 DIAGNOSIS — C679 Malignant neoplasm of bladder, unspecified: Secondary | ICD-10-CM | POA: Diagnosis not present

## 2024-02-03 DIAGNOSIS — Z5111 Encounter for antineoplastic chemotherapy: Secondary | ICD-10-CM | POA: Diagnosis not present

## 2024-02-03 DIAGNOSIS — Z510 Encounter for antineoplastic radiation therapy: Secondary | ICD-10-CM | POA: Diagnosis not present

## 2024-02-03 LAB — CMP (CANCER CENTER ONLY)
ALT: 14 U/L (ref 0–44)
AST: 18 U/L (ref 15–41)
Albumin: 3.9 g/dL (ref 3.5–5.0)
Alkaline Phosphatase: 61 U/L (ref 38–126)
Anion gap: 6 (ref 5–15)
BUN: 28 mg/dL — ABNORMAL HIGH (ref 8–23)
CO2: 28 mmol/L (ref 22–32)
Calcium: 9.2 mg/dL (ref 8.9–10.3)
Chloride: 107 mmol/L (ref 98–111)
Creatinine: 1.3 mg/dL — ABNORMAL HIGH (ref 0.61–1.24)
GFR, Estimated: 54 mL/min — ABNORMAL LOW (ref >60)
Glucose, Bld: 94 mg/dL (ref 70–99)
Potassium: 4 mmol/L (ref 3.5–5.1)
Sodium: 141 mmol/L (ref 135–145)
Total Bilirubin: 0.6 mg/dL (ref 0.0–1.2)
Total Protein: 6.9 g/dL (ref 6.5–8.1)

## 2024-02-03 LAB — RAD ONC ARIA SESSION SUMMARY
Course Elapsed Days: 0
Plan Fractions Treated to Date: 1
Plan Prescribed Dose Per Fraction: 1.8 Gy
Plan Total Fractions Prescribed: 11
Plan Total Prescribed Dose: 19.8 Gy
Reference Point Dosage Given to Date: 1.8 Gy
Reference Point Session Dosage Given: 1.8 Gy
Session Number: 1

## 2024-02-03 LAB — CBC WITH DIFFERENTIAL (CANCER CENTER ONLY)
Abs Immature Granulocytes: 0.01 K/uL (ref 0.00–0.07)
Basophils Absolute: 0 K/uL (ref 0.0–0.1)
Basophils Relative: 0 %
Eosinophils Absolute: 0.1 K/uL (ref 0.0–0.5)
Eosinophils Relative: 2 %
HCT: 39.8 % (ref 39.0–52.0)
Hemoglobin: 13.5 g/dL (ref 13.0–17.0)
Immature Granulocytes: 0 %
Lymphocytes Relative: 46 %
Lymphs Abs: 1.9 K/uL (ref 0.7–4.0)
MCH: 30.8 pg (ref 26.0–34.0)
MCHC: 33.9 g/dL (ref 30.0–36.0)
MCV: 90.9 fL (ref 80.0–100.0)
Monocytes Absolute: 0.4 K/uL (ref 0.1–1.0)
Monocytes Relative: 9 %
Neutro Abs: 1.8 K/uL (ref 1.7–7.7)
Neutrophils Relative %: 43 %
Platelet Count: 149 K/uL — ABNORMAL LOW (ref 150–400)
RBC: 4.38 MIL/uL (ref 4.22–5.81)
RDW: 14.7 % (ref 11.5–15.5)
WBC Count: 4.1 K/uL (ref 4.0–10.5)
nRBC: 0 % (ref 0.0–0.2)

## 2024-02-03 MED ORDER — SODIUM CHLORIDE 0.9% FLUSH
10.0000 mL | INTRAVENOUS | Status: DC | PRN
  Administered 2024-02-03: 10 mL

## 2024-02-03 MED ORDER — SODIUM CHLORIDE 0.9 % IV SOLN
27.0000 mg/m2 | Freq: Once | INTRAVENOUS | Status: AC
  Administered 2024-02-03: 46 mg via INTRAVENOUS
  Filled 2024-02-03: qty 1.21

## 2024-02-03 MED ORDER — SODIUM CHLORIDE 0.9% FLUSH
10.0000 mL | Freq: Once | INTRAVENOUS | Status: AC
  Administered 2024-02-03: 10 mL

## 2024-02-03 MED ORDER — SODIUM CHLORIDE 0.9 % IV SOLN: INTRAVENOUS | Status: DC

## 2024-02-03 MED ORDER — PROCHLORPERAZINE MALEATE 10 MG PO TABS
10.0000 mg | ORAL_TABLET | Freq: Once | ORAL | Status: AC
Start: 2024-02-03 — End: 2024-02-03
  Administered 2024-02-03: 10 mg via ORAL
  Filled 2024-02-03: qty 1

## 2024-02-03 NOTE — Assessment & Plan Note (Addendum)
 Day 1 today Return twice weekly for chemotherapy Follow up with labs and MD weekly

## 2024-02-03 NOTE — Patient Instructions (Addendum)
 [facility] - A DEPT OF [facility]  Discharge Instructions: Thank you for choosing [facility] to provide your oncology and hematology care.   If you have a lab appointment with the Cancer Center, please go directly to the Cancer Center and check in at the registration area.   Wear comfortable clothing and clothing appropriate for easy access to any Portacath or PICC line.   We strive to give you quality time with your provider. You may need to reschedule your appointment if you arrive late (15 or more minutes).  Arriving late affects you and other patients whose appointments are after yours.  Also, if you miss three or more appointments without notifying the office, you may be dismissed from the clinic at the provider's discretion.      For prescription refill requests, have your pharmacy contact our office and allow 72 hours for refills to be completed.    Today you received the following chemotherapy and/or immunotherapy agents Gemzar      To help prevent nausea and vomiting after your treatment, we encourage you to take your nausea medication as directed.  BELOW ARE SYMPTOMS THAT SHOULD BE REPORTED IMMEDIATELY: *FEVER GREATER THAN 100.4 F (38 C) OR HIGHER *CHILLS OR SWEATING *NAUSEA AND VOMITING THAT IS NOT CONTROLLED WITH YOUR NAUSEA MEDICATION *UNUSUAL SHORTNESS OF BREATH *UNUSUAL BRUISING OR BLEEDING *URINARY PROBLEMS (pain or burning when urinating, or frequent urination) *BOWEL PROBLEMS (unusual diarrhea, constipation, pain near the anus) TENDERNESS IN MOUTH AND THROAT WITH OR WITHOUT PRESENCE OF ULCERS (sore throat, sores in mouth, or a toothache) UNUSUAL RASH, SWELLING OR PAIN  UNUSUAL VAGINAL DISCHARGE OR ITCHING   Items with * indicate a potential emergency and should be followed up as soon as possible or go to the Emergency Department if any problems should occur.  Please show the CHEMOTHERAPY ALERT CARD or IMMUNOTHERAPY  ALERT CARD at check-in to the Emergency Department and triage nurse.  Should you have questions after your visit or need to cancel or reschedule your appointment, please contact [facility] - A [facility]PT OF [facility]  Dept: [phone]  and follow the prompts.  Office hours are 8:00 a.m. to 4:30 p.m. Monday - Friday. Please note that voicemails left after 4:00 p.m. may not be returned until the following business day.  We are closed weekends and major holidays. You have access to a nurse at all times for urgent questions. Please call the main number to the clinic Dept: [phone] and follow the prompts.   For any non-urgent questions, you may also contact your provider using MyChart. We now offer e-Visits for anyone 5 and older to request care online for non-urgent symptoms. For details visit [email or web address].   Also download the MyChart app! Go to the app store, search "MyChart", open the app, select [place], and log in with your MyChart username and password.  Gemcitabine Injection What is this medication? GEMCITABINE (jem SYE ta been) treats some types of cancer. It works by slowing down the growth of cancer cells. This medicine may be used for other purposes; ask your health care provider or pharmacist if you have questions. COMMON BRAND NAME(S): Gemzar, Infugem What should I tell my care team before I take this medication? They need to know if you have any of these conditions: Blood disorders Infection Kidney disease Liver disease Lung or breathing disease, such as asthma or COPD Recent or ongoing  radiation therapy An unusual or allergic reaction to gemcitabine, other medications, foods, dyes, or preservatives If you or your partner are pregnant or trying to get pregnant Breast-feeding How should I use this medication? This medication is injected into a vein. It is given by your care team in a hospital or clinic setting. Talk to your care  team about the use of this medication in children. Special care may be needed. Overdosage: If you think you have taken too much of this medicine contact a poison control center or emergency room at once. NOTE: This medicine is only for you. Do not share this medicine with others. What if I miss a dose? Keep appointments for follow-up doses. It is important not to miss your dose. Call your care team if you are unable to keep an appointment. What may interact with this medication? Interactions have not been studied. This list may not describe all possible interactions. Give your health care provider a list of all the medicines, herbs, non-prescription drugs, or dietary supplements you use. Also tell them if you smoke, drink alcohol, or use illegal drugs. Some items may interact with your medicine. What should I watch for while using this medication? Your condition will be monitored carefully while you are receiving this medication. This medication may make you feel generally unwell. This is not uncommon, as chemotherapy can affect healthy cells as well as cancer cells. Report any side effects. Continue your course of treatment even though you feel ill unless your care team tells you to stop. In some cases, you may be given additional medications to help with side effects. Follow all directions for their use. This medication may increase your risk of getting an infection. Call your care team for advice if you get a fever, chills, sore throat, or other symptoms of a cold or flu. Do not treat yourself. Try to avoid being around people who are sick. This medication may increase your risk to bruise or bleed. Call your care team if you notice any unusual bleeding. Be careful brushing or flossing your teeth or using a toothpick because you may get an infection or bleed more easily. If you have any dental work done, tell your dentist you are receiving this medication. Avoid taking medications that contain  aspirin, acetaminophen, ibuprofen, naproxen, or ketoprofen unless instructed by your care team. These medications may hide a fever. Talk to your care team if you or your partner wish to become pregnant or think you might be pregnant. This medication can cause serious birth defects if taken during pregnancy and for 6 months after the last dose. A negative pregnancy test is required before starting this medication. A reliable form of contraception is recommended while taking this medication and for 6 months after the last dose. Talk to your care team about effective forms of contraception. Do not father a child while taking this medication and for 3 months after the last dose. Use a condom while having sex during this time period. Do not breastfeed while taking this medication and for at least 1 week after the last dose. This medication may cause infertility. Talk to your care team if you are concerned about your fertility. What side effects may I notice from receiving this medication? Side effects that you should report to your care team as soon as possible: Allergic reactions--skin rash, itching, hives, swelling of the face, lips, tongue, or throat Capillary leak syndrome--stomach or muscle pain, unusual weakness or fatigue, feeling faint or lightheaded, decrease in  the amount of urine, swelling of the ankles, hands, or feet, trouble breathing Infection--fever, chills, cough, sore throat, wounds that don't heal, pain or trouble when passing urine, general feeling of discomfort or being unwell Liver injury--right upper belly pain, loss of appetite, nausea, light-colored stool, dark yellow or brown urine, yellowing skin or eyes, unusual weakness or fatigue Low red blood cell level--unusual weakness or fatigue, dizziness, headache, trouble breathing Lung injury--shortness of breath or trouble breathing, cough, spitting up blood, chest pain, fever Stomach pain, bloody diarrhea, pale skin, unusual weakness or  fatigue, decrease in the amount of urine, which may be signs of hemolytic uremic syndrome Sudden and severe headache, confusion, change in vision, seizures, which may be signs of posterior reversible encephalopathy syndrome (PRES) Unusual bruising or bleeding Side effects that usually do not require medical attention (report to your care team if they continue or are bothersome): Diarrhea Drowsiness Hair loss Nausea Pain, redness, or swelling with sores inside the mouth or throat Vomiting This list may not describe all possible side effects. Call your doctor for medical advice about side effects. You may report side effects to FDA at 1-800-FDA-1088. Where should I keep my medication? This medication is given in a hospital or clinic. It will not be stored at home. NOTE: This sheet is a summary. It may not cover all possible information. If you have questions about this medicine, talk to your doctor, pharmacist, or health care provider.  2024 Elsevier/Gold Standard (2021-10-09 00:00:00)

## 2024-02-04 ENCOUNTER — Telehealth: Payer: Self-pay

## 2024-02-04 ENCOUNTER — Ambulatory Visit
Admission: RE | Admit: 2024-02-04 | Discharge: 2024-02-04 | Disposition: A | Discharge status: Home / Self Care | Source: Ambulatory Visit | Attending: Radiation Oncology | Admitting: Radiation Oncology

## 2024-02-04 ENCOUNTER — Other Ambulatory Visit: Payer: Self-pay

## 2024-02-04 DIAGNOSIS — C672 Malignant neoplasm of lateral wall of bladder: Secondary | ICD-10-CM | POA: Diagnosis not present

## 2024-02-04 DIAGNOSIS — Z510 Encounter for antineoplastic radiation therapy: Secondary | ICD-10-CM | POA: Diagnosis not present

## 2024-02-04 DIAGNOSIS — Z79899 Other long term (current) drug therapy: Secondary | ICD-10-CM | POA: Diagnosis not present

## 2024-02-04 DIAGNOSIS — Z5111 Encounter for antineoplastic chemotherapy: Secondary | ICD-10-CM | POA: Diagnosis not present

## 2024-02-04 LAB — RAD ONC ARIA SESSION SUMMARY
Course Elapsed Days: 1
Plan Fractions Treated to Date: 2
Plan Prescribed Dose Per Fraction: 1.8 Gy
Plan Total Fractions Prescribed: 11
Plan Total Prescribed Dose: 19.8 Gy
Reference Point Dosage Given to Date: 3.6 Gy
Reference Point Session Dosage Given: 1.8 Gy
Session Number: 2

## 2024-02-04 NOTE — Telephone Encounter (Signed)
-----   Message from Nurse Odella HERO sent at 02/03/2024  2:36 PM EDT ----- Regarding: Dr. Tina Pt. First time Gemzar  tolerated well, no issues noted. Thank you!

## 2024-02-04 NOTE — Telephone Encounter (Signed)
 Kenneth Sanford states that he is doing fine. He is eating, drinking, and urinating well. He knows to call the office at [phone] if he has any questions or concerns.

## 2024-02-05 ENCOUNTER — Ambulatory Visit
Admission: RE | Admit: 2024-02-05 | Discharge: 2024-02-05 | Disposition: A | Discharge status: Home / Self Care | Source: Ambulatory Visit | Attending: Radiation Oncology | Admitting: Radiation Oncology

## 2024-02-05 ENCOUNTER — Other Ambulatory Visit: Payer: Self-pay

## 2024-02-05 DIAGNOSIS — Z5111 Encounter for antineoplastic chemotherapy: Secondary | ICD-10-CM | POA: Diagnosis not present

## 2024-02-05 DIAGNOSIS — C672 Malignant neoplasm of lateral wall of bladder: Secondary | ICD-10-CM | POA: Diagnosis not present

## 2024-02-05 DIAGNOSIS — Z79899 Other long term (current) drug therapy: Secondary | ICD-10-CM | POA: Diagnosis not present

## 2024-02-05 DIAGNOSIS — Z510 Encounter for antineoplastic radiation therapy: Secondary | ICD-10-CM | POA: Diagnosis not present

## 2024-02-05 LAB — RAD ONC ARIA SESSION SUMMARY
Course Elapsed Days: 2
Plan Fractions Treated to Date: 3
Plan Prescribed Dose Per Fraction: 1.8 Gy
Plan Total Fractions Prescribed: 11
Plan Total Prescribed Dose: 19.8 Gy
Reference Point Dosage Given to Date: 5.4 Gy
Reference Point Session Dosage Given: 1.8 Gy
Session Number: 3

## 2024-02-06 ENCOUNTER — Other Ambulatory Visit: Payer: Self-pay

## 2024-02-06 ENCOUNTER — Ambulatory Visit
Admission: RE | Admit: 2024-02-06 | Discharge: 2024-02-06 | Disposition: A | Discharge status: Home / Self Care | Source: Ambulatory Visit | Attending: Radiation Oncology | Admitting: Radiation Oncology

## 2024-02-06 ENCOUNTER — Inpatient Hospital Stay

## 2024-02-06 VITALS — BP 111/75 | HR 73 | Temp 98.0°F | Resp 16 | Wt 145.5 lb

## 2024-02-06 DIAGNOSIS — Z5111 Encounter for antineoplastic chemotherapy: Secondary | ICD-10-CM | POA: Diagnosis not present

## 2024-02-06 DIAGNOSIS — Z79899 Other long term (current) drug therapy: Secondary | ICD-10-CM | POA: Diagnosis not present

## 2024-02-06 DIAGNOSIS — C672 Malignant neoplasm of lateral wall of bladder: Secondary | ICD-10-CM | POA: Diagnosis not present

## 2024-02-06 DIAGNOSIS — Z510 Encounter for antineoplastic radiation therapy: Secondary | ICD-10-CM | POA: Diagnosis not present

## 2024-02-06 DIAGNOSIS — C679 Malignant neoplasm of bladder, unspecified: Secondary | ICD-10-CM

## 2024-02-06 LAB — RAD ONC ARIA SESSION SUMMARY
Course Elapsed Days: 3
Plan Fractions Treated to Date: 4
Plan Prescribed Dose Per Fraction: 1.8 Gy
Plan Total Fractions Prescribed: 11
Plan Total Prescribed Dose: 19.8 Gy
Reference Point Dosage Given to Date: 7.2 Gy
Reference Point Session Dosage Given: 1.8 Gy
Session Number: 4

## 2024-02-06 MED ORDER — SODIUM CHLORIDE 0.9 % IV SOLN: INTRAVENOUS | Status: DC

## 2024-02-06 MED ORDER — PROCHLORPERAZINE MALEATE 10 MG PO TABS
10.0000 mg | ORAL_TABLET | Freq: Once | ORAL | Status: AC
  Administered 2024-02-06: 10 mg via ORAL
  Filled 2024-02-06: qty 1

## 2024-02-06 MED ORDER — SODIUM CHLORIDE 0.9 % IV SOLN
27.0000 mg/m2 | Freq: Once | INTRAVENOUS | Status: AC
  Administered 2024-02-06: 46 mg via INTRAVENOUS
  Filled 2024-02-06: qty 1.21

## 2024-02-06 NOTE — Patient Instructions (Signed)
 [facility] A [facility]PT OF [facility]  [facility]  Discharge Instructions: Thank you for choosing [facility] to provide your oncology and hematology care.   If you have a lab appointment with the Cancer Center, please go directly to the Cancer Center and check in at the registration area.   Wear comfortable clothing and clothing appropriate for easy access to any Portacath or PICC line.   We strive to give you quality time with your provider. You may need to reschedule your appointment if you arrive late (15 or more minutes).  Arriving late affects you and other patients whose appointments are after yours.  Also, if you miss three or more appointments without notifying the office, you may be dismissed from the clinic at the provider's discretion.      For prescription refill requests, have your pharmacy contact our office and allow 72 hours for refills to be completed.    Today you received the following chemotherapy and/or immunotherapy agents: gemcitabine       To help prevent nausea and vomiting after your treatment, we encourage you to take your nausea medication as directed.  BELOW ARE SYMPTOMS THAT SHOULD BE REPORTED IMMEDIATELY: *FEVER GREATER THAN 100.4 F (38 C) OR HIGHER *CHILLS OR SWEATING *NAUSEA AND VOMITING THAT IS NOT CONTROLLED WITH YOUR NAUSEA MEDICATION *UNUSUAL SHORTNESS OF BREATH *UNUSUAL BRUISING OR BLEEDING *URINARY PROBLEMS (pain or burning when urinating, or frequent urination) *BOWEL PROBLEMS (unusual diarrhea, constipation, pain near the anus) TENDERNESS IN MOUTH AND THROAT WITH OR WITHOUT PRESENCE OF ULCERS (sore throat, sores in mouth, or a toothache) UNUSUAL RASH, SWELLING OR PAIN  UNUSUAL VAGINAL DISCHARGE OR ITCHING   Items with * indicate a potential emergency and should be followed up as soon as possible or go to the Emergency Department if any problems should occur.  Please show the CHEMOTHERAPY ALERT CARD or  IMMUNOTHERAPY ALERT CARD at check-in to the Emergency Department and triage nurse.  Should you have questions after your visit or need to cancel or reschedule your appointment, please contact [facility] - A DEPT OF [facility]  Dept: [phone]  and follow the prompts.  Office hours are 8:00 a.m. to 4:30 p.m. Monday - Friday. Please note that voicemails left after 4:00 p.m. may not be returned until the following business day.  We are closed weekends and major holidays. You have access to a nurse at all times for urgent questions. Please call the main number to the clinic Dept: [phone] and follow the prompts.   For any non-urgent questions, you may also contact your provider using MyChart. We now offer e-Visits for anyone 1 and older to request care online for non-urgent symptoms. For details visit [email or web address].   Also download the MyChart app! Go to the app store, search MyChart, open the app, select [place], and log in with your MyChart username and password.

## 2024-02-09 ENCOUNTER — Other Ambulatory Visit: Payer: Self-pay

## 2024-02-09 ENCOUNTER — Ambulatory Visit
Admission: RE | Admit: 2024-02-09 | Discharge: 2024-02-09 | Disposition: A | Discharge status: Home / Self Care | Source: Ambulatory Visit | Attending: Radiation Oncology | Admitting: Radiation Oncology

## 2024-02-09 DIAGNOSIS — Z510 Encounter for antineoplastic radiation therapy: Secondary | ICD-10-CM | POA: Diagnosis not present

## 2024-02-09 DIAGNOSIS — Z79899 Other long term (current) drug therapy: Secondary | ICD-10-CM | POA: Diagnosis not present

## 2024-02-09 DIAGNOSIS — C679 Malignant neoplasm of bladder, unspecified: Secondary | ICD-10-CM | POA: Diagnosis not present

## 2024-02-09 DIAGNOSIS — Z5111 Encounter for antineoplastic chemotherapy: Secondary | ICD-10-CM | POA: Diagnosis not present

## 2024-02-09 DIAGNOSIS — C672 Malignant neoplasm of lateral wall of bladder: Secondary | ICD-10-CM | POA: Diagnosis not present

## 2024-02-09 LAB — RAD ONC ARIA SESSION SUMMARY
Course Elapsed Days: 6
Plan Fractions Treated to Date: 5
Plan Prescribed Dose Per Fraction: 1.8 Gy
Plan Total Fractions Prescribed: 11
Plan Total Prescribed Dose: 19.8 Gy
Reference Point Dosage Given to Date: 9 Gy
Reference Point Session Dosage Given: 1.8 Gy
Session Number: 5

## 2024-02-09 NOTE — Assessment & Plan Note (Signed)
 8/19 C1D1 CRT Return weekly with labs.

## 2024-02-09 NOTE — Progress Notes (Unsigned)
 [facility] OFFICE PROGRESS NOTE  Patient Care Team: Benjamine Aland, MD as PCP - General (Family Medicine) Kristie Lamprey, MD as Consulting Physician (Gastroenterology)  [name] is a 83 y.o.male with history of BPH with TURP, CKD3, arthritis, hypothyroidism [facility].   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 The carcinoma involving adjacent prostatic ducts and stroma. CT reports negative for metastatic disease. Clinical cT4aN0M0 stage IIIA.   After discussion he elected on CRT.   Plan on six weeks of low dose gemcitabine . Discussed results. Slight worsening platelet and leukopenia. Monitor for fever, infection and bleeding. Assessment & Plan Urothelial carcinoma of bladder (HCC) 8/19 C1D1 CRT Return weekly with labs. Leukopenia, unspecified type Secondary to CRT.  Will monitor weekly.  Discussed neutropenic precautions. Thrombocytopenia (HCC) Discussed bleeding precautions. Monitor weekly.   [name], MD  INTERVAL HISTORY: Patient returns for follow-up. Report he reports increase urination. No dysuria, hematuria. He drinks a lot of water. No fever, chills, stomach pain, chest pain, coughing, diarrhea.  Oncology History  Urothelial carcinoma of bladder (HCC)  09/14/2021 Pathology Results   Prostate, chips BENIGN NODULAR GLANDULAR AND STROMAL HYPERPLASIA MILD CHRONIC PROSTATITIS NEGATIVE FOR CARCINOMA   09/15/2023 Cancer Staging   Staging form: Urinary Bladder, AJCC 8th Edition - Clinical stage from 09/15/2023: Stage IIIA (cT4a, cN0, cM0) - Signed by Sherwood Rise, PA-C on 01/15/2024 Stage prefix: Initial diagnosis WHO/ISUP grade (low/high): High Grade Histologic grading system: 2 grade system   10/13/2023 Imaging   CT AP w/wo  At least 2 small enhancing mural nodules along the right lateral bladder wall suspicious for bladder carcinoma..  Markedly enlarged prostate  gland which indents the bladder base. Asymmetric soft tissue density again seen protruding from the right lateral prostate base and right seminal vesicle. Prostate carcinoma with invasino of right seminal vesicle cannot be excluded.  No evidence of metastatic disease.  No evidenced of upper urinary tract neoplasm, urolithiasis or hydronephrosis.    10/31/2023 Pathology Results   A. BLADDER, TUMOR, TURBT:  Infiltrating high grade papillary urothelial carcinoma with  micropapillary (40%) component  The carcinoma invades muscularis propria (detrusor muscle)   B. PROSTATE CHIPS, TURP:  High grade urothelial carcinoma  The carcinoma involving adjacent prostatic ducts and stroma    11/18/2023 Initial Diagnosis   Urothelial carcinoma of bladder (HCC)   02/03/2024 -  Chemotherapy   Patient is on Treatment Plan : BLADDER Gemcitabine  Twice Weekly + XRT        PHYSICAL EXAMINATION: ECOG PERFORMANCE STATUS: 2 - Symptomatic, <50% confined to bed  Vitals:   02/10/24 1508  BP: 107/85  Pulse: 97  Resp: 20  Temp: 98.1 F (36.7 C)  SpO2: 99%   Filed Weights   02/10/24 1508  Weight: 142 lb 11.2 oz (64.7 kg)    GENERAL: alert, no distress and comfortable LYMPH:  no palpable cervical lymphadenopathy  LUNGS: clear to auscultation and percussion with normal breathing effort HEART: regular rate & rhythm  ABDOMEN: abdomen soft, non-tender and nondistended. Musculoskeletal: no edema   Relevant data reviewed during this visit included labs.

## 2024-02-10 ENCOUNTER — Ambulatory Visit
Admission: RE | Admit: 2024-02-10 | Discharge: 2024-02-10 | Disposition: A | Discharge status: Home / Self Care | Source: Ambulatory Visit | Attending: Radiation Oncology | Admitting: Radiation Oncology

## 2024-02-10 ENCOUNTER — Inpatient Hospital Stay

## 2024-02-10 ENCOUNTER — Other Ambulatory Visit: Payer: Self-pay

## 2024-02-10 ENCOUNTER — Inpatient Hospital Stay (HOSPITAL_BASED_OUTPATIENT_CLINIC_OR_DEPARTMENT_OTHER)

## 2024-02-10 ENCOUNTER — Other Ambulatory Visit

## 2024-02-10 VITALS — BP 107/85 | HR 97 | Temp 98.1°F | Resp 20 | Wt 142.7 lb

## 2024-02-10 DIAGNOSIS — D696 Thrombocytopenia, unspecified: Secondary | ICD-10-CM | POA: Diagnosis not present

## 2024-02-10 DIAGNOSIS — C679 Malignant neoplasm of bladder, unspecified: Secondary | ICD-10-CM

## 2024-02-10 DIAGNOSIS — D61818 Other pancytopenia: Secondary | ICD-10-CM | POA: Insufficient documentation

## 2024-02-10 DIAGNOSIS — D72819 Decreased white blood cell count, unspecified: Secondary | ICD-10-CM | POA: Diagnosis not present

## 2024-02-10 DIAGNOSIS — C672 Malignant neoplasm of lateral wall of bladder: Secondary | ICD-10-CM | POA: Diagnosis not present

## 2024-02-10 DIAGNOSIS — Z510 Encounter for antineoplastic radiation therapy: Secondary | ICD-10-CM | POA: Diagnosis not present

## 2024-02-10 DIAGNOSIS — Z79899 Other long term (current) drug therapy: Secondary | ICD-10-CM | POA: Diagnosis not present

## 2024-02-10 DIAGNOSIS — Z5111 Encounter for antineoplastic chemotherapy: Secondary | ICD-10-CM | POA: Diagnosis not present

## 2024-02-10 LAB — RAD ONC ARIA SESSION SUMMARY
Course Elapsed Days: 7
Plan Fractions Treated to Date: 6
Plan Prescribed Dose Per Fraction: 1.8 Gy
Plan Total Fractions Prescribed: 11
Plan Total Prescribed Dose: 19.8 Gy
Reference Point Dosage Given to Date: 10.8 Gy
Reference Point Session Dosage Given: 1.8 Gy
Session Number: 6

## 2024-02-10 LAB — CBC WITH DIFFERENTIAL (CANCER CENTER ONLY)
Abs Immature Granulocytes: 0.01 K/uL (ref 0.00–0.07)
Basophils Absolute: 0 K/uL (ref 0.0–0.1)
Basophils Relative: 0 %
Eosinophils Absolute: 0 K/uL (ref 0.0–0.5)
Eosinophils Relative: 1 %
HCT: 38.1 % — ABNORMAL LOW (ref 39.0–52.0)
Hemoglobin: 13 g/dL (ref 13.0–17.0)
Immature Granulocytes: 0 %
Lymphocytes Relative: 32 %
Lymphs Abs: 0.9 K/uL (ref 0.7–4.0)
MCH: 30.6 pg (ref 26.0–34.0)
MCHC: 34.1 g/dL (ref 30.0–36.0)
MCV: 89.6 fL (ref 80.0–100.0)
Monocytes Absolute: 0.2 K/uL (ref 0.1–1.0)
Monocytes Relative: 6 %
Neutro Abs: 1.7 K/uL (ref 1.7–7.7)
Neutrophils Relative %: 61 %
Platelet Count: 108 K/uL — ABNORMAL LOW (ref 150–400)
RBC: 4.25 MIL/uL (ref 4.22–5.81)
RDW: 14.2 % (ref 11.5–15.5)
WBC Count: 2.8 K/uL — ABNORMAL LOW (ref 4.0–10.5)
nRBC: 0 % (ref 0.0–0.2)

## 2024-02-10 LAB — CMP (CANCER CENTER ONLY)
ALT: 8 U/L (ref 0–44)
AST: 15 U/L (ref 15–41)
Albumin: 3.8 g/dL (ref 3.5–5.0)
Alkaline Phosphatase: 62 U/L (ref 38–126)
Anion gap: 5 (ref 5–15)
BUN: 21 mg/dL (ref 8–23)
CO2: 28 mmol/L (ref 22–32)
Calcium: 9 mg/dL (ref 8.9–10.3)
Chloride: 105 mmol/L (ref 98–111)
Creatinine: 1.19 mg/dL (ref 0.61–1.24)
GFR, Estimated: >60 mL/min (ref >60)
Glucose, Bld: 99 mg/dL (ref 70–99)
Potassium: 4 mmol/L (ref 3.5–5.1)
Sodium: 138 mmol/L (ref 135–145)
Total Bilirubin: 0.7 mg/dL (ref 0.0–1.2)
Total Protein: 6.9 g/dL (ref 6.5–8.1)

## 2024-02-10 MED ORDER — SODIUM CHLORIDE 0.9 % IV SOLN
27.0000 mg/m2 | Freq: Once | INTRAVENOUS | Status: AC
  Administered 2024-02-10: 46 mg via INTRAVENOUS
  Filled 2024-02-10: qty 1.21

## 2024-02-10 MED ORDER — SODIUM CHLORIDE 0.9 % IV SOLN: INTRAVENOUS | Status: DC

## 2024-02-10 MED ORDER — SODIUM CHLORIDE 0.9% FLUSH: 10.0000 mL | INTRAVENOUS | Status: DC | PRN

## 2024-02-10 MED ORDER — SODIUM CHLORIDE 0.9% FLUSH
10.0000 mL | Freq: Once | INTRAVENOUS | Status: AC
  Administered 2024-02-10: 10 mL

## 2024-02-10 MED ORDER — PROCHLORPERAZINE MALEATE 10 MG PO TABS
10.0000 mg | ORAL_TABLET | Freq: Once | ORAL | Status: AC
  Administered 2024-02-10: 10 mg via ORAL
  Filled 2024-02-10: qty 1

## 2024-02-10 NOTE — Assessment & Plan Note (Addendum)
 Secondary to CRT.  Will monitor weekly.  Discussed neutropenic precautions.

## 2024-02-10 NOTE — Patient Instructions (Signed)
 [facility]  Discharge Instructions: Thank you for choosing [facility] to provide your oncology and hematology care.   If you have a lab appointment with the Cancer Center, please go directly to the Cancer Center and check in at the registration area.   Wear comfortable clothing and clothing appropriate for easy access to any Portacath or PICC line.   We strive to give you quality time with your provider. You may need to reschedule your appointment if you arrive late (15 or more minutes).  Arriving late affects you and other patients whose appointments are after yours.  Also, if you miss three or more appointments without notifying the office, you may be dismissed from the clinic at the provider's discretion.      For prescription refill requests, have your pharmacy contact our office and allow 72 hours for refills to be completed.    Today you received the following chemotherapy and/or immunotherapy agents gemzar      To help prevent nausea and vomiting after your treatment, we encourage you to take your nausea medication as directed.  BELOW ARE SYMPTOMS THAT SHOULD BE REPORTED IMMEDIATELY: *FEVER GREATER THAN 100.4 F (38 C) OR HIGHER *CHILLS OR SWEATING *NAUSEA AND VOMITING THAT IS NOT CONTROLLED WITH YOUR NAUSEA MEDICATION *UNUSUAL SHORTNESS OF BREATH *UNUSUAL BRUISING OR BLEEDING *URINARY PROBLEMS (pain or burning when urinating, or frequent urination) *BOWEL PROBLEMS (unusual diarrhea, constipation, pain near the anus) TENDERNESS IN MOUTH AND THROAT WITH OR WITHOUT PRESENCE OF ULCERS (sore throat, sores in mouth, or a toothache) UNUSUAL RASH, SWELLING OR PAIN  UNUSUAL VAGINAL DISCHARGE OR ITCHING   Items with * indicate a potential emergency and should be followed up as soon as possible or go to the Emergency Department if any problems should occur.  Please show the CHEMOTHERAPY ALERT CARD or IMMUNOTHERAPY  ALERT CARD at check-in to the Emergency Department and triage nurse.  Should you have questions after your visit or need to cancel or reschedule your appointment, please contact [facility] - [facility]  Dept: [phone]  and follow the prompts.  Office hours are 8:00 a.m. to 4:30 p.m. Monday - Friday. Please note that voicemails left after 4:00 p.m. may not be returned until the following business day.  We are closed weekends and major holidays. You have access to a nurse at all times for urgent questions. Please call the main number to the clinic Dept: [phone] and follow the prompts.   For any non-urgent questions, you may also contact your provider using MyChart. We now offer e-Visits for anyone 19 and older to request care online for non-urgent symptoms. For details visit [email or web address].   Also download the MyChart app! Go to the app store, search "MyChart", open the app, select [place], and log in with your MyChart username and password.

## 2024-02-10 NOTE — Assessment & Plan Note (Addendum)
 Discussed bleeding precautions. Monitor weekly.

## 2024-02-11 ENCOUNTER — Other Ambulatory Visit: Payer: Self-pay

## 2024-02-11 ENCOUNTER — Ambulatory Visit
Admission: RE | Admit: 2024-02-11 | Discharge: 2024-02-11 | Disposition: A | Discharge status: Home / Self Care | Source: Ambulatory Visit | Attending: Radiation Oncology | Admitting: Radiation Oncology

## 2024-02-11 DIAGNOSIS — Z5111 Encounter for antineoplastic chemotherapy: Secondary | ICD-10-CM | POA: Diagnosis not present

## 2024-02-11 DIAGNOSIS — Z79899 Other long term (current) drug therapy: Secondary | ICD-10-CM | POA: Diagnosis not present

## 2024-02-11 DIAGNOSIS — Z510 Encounter for antineoplastic radiation therapy: Secondary | ICD-10-CM | POA: Diagnosis not present

## 2024-02-11 DIAGNOSIS — C672 Malignant neoplasm of lateral wall of bladder: Secondary | ICD-10-CM | POA: Diagnosis not present

## 2024-02-11 LAB — RAD ONC ARIA SESSION SUMMARY
Course Elapsed Days: 8
Plan Fractions Treated to Date: 7
Plan Prescribed Dose Per Fraction: 1.8 Gy
Plan Total Fractions Prescribed: 11
Plan Total Prescribed Dose: 19.8 Gy
Reference Point Dosage Given to Date: 12.6 Gy
Reference Point Session Dosage Given: 1.8 Gy
Session Number: 7

## 2024-02-12 ENCOUNTER — Ambulatory Visit
Admission: RE | Admit: 2024-02-12 | Discharge: 2024-02-12 | Disposition: A | Discharge status: Home / Self Care | Source: Ambulatory Visit | Attending: Radiation Oncology | Admitting: Radiation Oncology

## 2024-02-12 ENCOUNTER — Other Ambulatory Visit: Payer: Self-pay

## 2024-02-12 DIAGNOSIS — Z79899 Other long term (current) drug therapy: Secondary | ICD-10-CM | POA: Diagnosis not present

## 2024-02-12 DIAGNOSIS — Z510 Encounter for antineoplastic radiation therapy: Secondary | ICD-10-CM | POA: Diagnosis not present

## 2024-02-12 DIAGNOSIS — C672 Malignant neoplasm of lateral wall of bladder: Secondary | ICD-10-CM | POA: Diagnosis not present

## 2024-02-12 DIAGNOSIS — Z5111 Encounter for antineoplastic chemotherapy: Secondary | ICD-10-CM | POA: Diagnosis not present

## 2024-02-12 LAB — RAD ONC ARIA SESSION SUMMARY
Course Elapsed Days: 9
Plan Fractions Treated to Date: 8
Plan Prescribed Dose Per Fraction: 1.8 Gy
Plan Total Fractions Prescribed: 11
Plan Total Prescribed Dose: 19.8 Gy
Reference Point Dosage Given to Date: 14.4 Gy
Reference Point Session Dosage Given: 1.8 Gy
Session Number: 8

## 2024-02-13 ENCOUNTER — Other Ambulatory Visit: Payer: Self-pay

## 2024-02-13 ENCOUNTER — Ambulatory Visit
Admission: RE | Admit: 2024-02-13 | Discharge: 2024-02-13 | Disposition: A | Discharge status: Home / Self Care | Source: Ambulatory Visit | Attending: Radiation Oncology | Admitting: Radiation Oncology

## 2024-02-13 ENCOUNTER — Inpatient Hospital Stay

## 2024-02-13 ENCOUNTER — Ambulatory Visit
Admission: RE | Admit: 2024-02-13 | Discharge: 2024-02-13 | Disposition: A | Discharge status: Home / Self Care | Payer: Self-pay | Source: Ambulatory Visit | Attending: Radiation Oncology | Admitting: Radiation Oncology

## 2024-02-13 VITALS — BP 109/71 | HR 102 | Temp 98.1°F | Resp 18

## 2024-02-13 DIAGNOSIS — C672 Malignant neoplasm of lateral wall of bladder: Secondary | ICD-10-CM | POA: Diagnosis not present

## 2024-02-13 DIAGNOSIS — C679 Malignant neoplasm of bladder, unspecified: Secondary | ICD-10-CM

## 2024-02-13 DIAGNOSIS — Z79899 Other long term (current) drug therapy: Secondary | ICD-10-CM | POA: Diagnosis not present

## 2024-02-13 DIAGNOSIS — Z510 Encounter for antineoplastic radiation therapy: Secondary | ICD-10-CM | POA: Diagnosis not present

## 2024-02-13 DIAGNOSIS — Z5111 Encounter for antineoplastic chemotherapy: Secondary | ICD-10-CM | POA: Diagnosis not present

## 2024-02-13 LAB — RAD ONC ARIA SESSION SUMMARY
Course Elapsed Days: 10
Plan Fractions Treated to Date: 9
Plan Prescribed Dose Per Fraction: 1.8 Gy
Plan Total Fractions Prescribed: 11
Plan Total Prescribed Dose: 19.8 Gy
Reference Point Dosage Given to Date: 16.2 Gy
Reference Point Session Dosage Given: 1.8 Gy
Session Number: 9

## 2024-02-13 MED ORDER — SODIUM CHLORIDE 0.9 % IV SOLN: INTRAVENOUS | Status: DC

## 2024-02-13 MED ORDER — SODIUM CHLORIDE 0.9% FLUSH: 10.0000 mL | INTRAVENOUS | Status: DC | PRN

## 2024-02-13 MED ORDER — PROCHLORPERAZINE MALEATE 10 MG PO TABS
10.0000 mg | ORAL_TABLET | Freq: Once | ORAL | Status: AC
  Administered 2024-02-13: 10 mg via ORAL
  Filled 2024-02-13: qty 1

## 2024-02-13 MED ORDER — SODIUM CHLORIDE 0.9 % IV SOLN
27.0000 mg/m2 | Freq: Once | INTRAVENOUS | Status: AC
  Administered 2024-02-13: 46 mg via INTRAVENOUS
  Filled 2024-02-13: qty 1.21

## 2024-02-13 NOTE — Patient Instructions (Signed)
 [facility]  Discharge Instructions: Thank you for choosing [facility] to provide your oncology and hematology care.   If you have a lab appointment with the Cancer Center, please go directly to the Cancer Center and check in at the registration area.   Wear comfortable clothing and clothing appropriate for easy access to any Portacath or PICC line.   We strive to give you quality time with your provider. You may need to reschedule your appointment if you arrive late (15 or more minutes).  Arriving late affects you and other patients whose appointments are after yours.  Also, if you miss three or more appointments without notifying the office, you may be dismissed from the clinic at the provider's discretion.      For prescription refill requests, have your pharmacy contact our office and allow 72 hours for refills to be completed.    Today you received the following chemotherapy and/or immunotherapy agents gemzar      To help prevent nausea and vomiting after your treatment, we encourage you to take your nausea medication as directed.  BELOW ARE SYMPTOMS THAT SHOULD BE REPORTED IMMEDIATELY: *FEVER GREATER THAN 100.4 F (38 C) OR HIGHER *CHILLS OR SWEATING *NAUSEA AND VOMITING THAT IS NOT CONTROLLED WITH YOUR NAUSEA MEDICATION *UNUSUAL SHORTNESS OF BREATH *UNUSUAL BRUISING OR BLEEDING *URINARY PROBLEMS (pain or burning when urinating, or frequent urination) *BOWEL PROBLEMS (unusual diarrhea, constipation, pain near the anus) TENDERNESS IN MOUTH AND THROAT WITH OR WITHOUT PRESENCE OF ULCERS (sore throat, sores in mouth, or a toothache) UNUSUAL RASH, SWELLING OR PAIN  UNUSUAL VAGINAL DISCHARGE OR ITCHING   Items with * indicate a potential emergency and should be followed up as soon as possible or go to the Emergency Department if any problems should occur.  Please show the CHEMOTHERAPY ALERT CARD or IMMUNOTHERAPY  ALERT CARD at check-in to the Emergency Department and triage nurse.  Should you have questions after your visit or need to cancel or reschedule your appointment, please contact [facility] - [facility]  Dept: [phone]  and follow the prompts.  Office hours are 8:00 a.m. to 4:30 p.m. Monday - Friday. Please note that voicemails left after 4:00 p.m. may not be returned until the following business day.  We are closed weekends and major holidays. You have access to a nurse at all times for urgent questions. Please call the main number to the clinic Dept: [phone] and follow the prompts.   For any non-urgent questions, you may also contact your provider using MyChart. We now offer e-Visits for anyone 19 and older to request care online for non-urgent symptoms. For details visit [email or web address].   Also download the MyChart app! Go to the app store, search "MyChart", open the app, select [place], and log in with your MyChart username and password.

## 2024-02-13 NOTE — Progress Notes (Signed)
 [facility] CSW Progress Note  Clinical Child psychotherapist contacted patient by phone to follow-up on emotional support as patient continues treatment. CSW and patient discussed effects of treatment and how he's been managing       Follow Up Plan:  Patient will contact CSW with any support or resource needs    Lizbeth Sprague, LCSW Clinical Social Worker [facility]

## 2024-02-16 NOTE — Progress Notes (Addendum)
 [facility] OFFICE PROGRESS NOTE  Patient Care Team: Benjamine Aland, MD as PCP - General (Family Medicine) Kristie Lamprey, MD as Consulting Physician (Gastroenterology)  Kenneth Sanford is a 83 y.o.male with history of BPH with TURP, CKD3, arthritis, hypothyroidism [facility]   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 The carcinoma involving adjacent prostatic ducts and stroma. CT reports negative for metastatic disease. Clinical cT4aN0M0 stage IIIA.   After discussion he elected on CRT.  Plan on six weeks of low dose gemcitabine . Discussed results.  Patient has anemia, more thrombocytopenia and neutropenia this week.  Monitor for fever, infection and bleeding.  We will hold chemotherapy this week, and return for follow-up next week. Assessment & Plan Urothelial carcinoma of bladder (HCC) 8/19 C1D1 CRT No chemotherapy this week.  Patient will continue radiation. Return weekly with labs. Pancytopenia (HCC) Hold treatment this week Will need to monitor labs weekly. Bladder spasms Trial of oxybutynin   5 mg 2 times daily as needed    Kenneth JAYSON Chihuahua, MD  INTERVAL HISTORY: Patient returns for follow-up.  Reports a fall on Friday as he was feeling weak.  He denies any fractures or concerning findings.  No extensive bruising or bleeding.  He has frequent urination, sometimes a spasm and discomfort in the lower pelvis.  No pain.  No dysuria.  Oncology History  Urothelial carcinoma of bladder (HCC)  09/14/2021 Pathology Results   Prostate, chips BENIGN NODULAR GLANDULAR AND STROMAL HYPERPLASIA MILD CHRONIC PROSTATITIS NEGATIVE FOR CARCINOMA   09/15/2023 Cancer Staging   Staging form: Urinary Bladder, AJCC 8th Edition - Clinical stage from 09/15/2023: Stage IIIA (cT4a, cN0, cM0) - Signed by Sherwood Rise, PA-C on 01/15/2024 Stage prefix: Initial diagnosis WHO/ISUP grade (low/high): High  Grade Histologic grading system: 2 grade system   10/13/2023 Imaging   CT AP w/wo  At least 2 small enhancing mural nodules along the right lateral bladder wall suspicious for bladder carcinoma..  Markedly enlarged prostate gland which indents the bladder base. Asymmetric soft tissue density again seen protruding from the right lateral prostate base and right seminal vesicle. Prostate carcinoma with invasino of right seminal vesicle cannot be excluded.  No evidence of metastatic disease.  No evidenced of upper urinary tract neoplasm, urolithiasis or hydronephrosis.    10/31/2023 Pathology Results   A. BLADDER, TUMOR, TURBT:  Infiltrating high grade papillary urothelial carcinoma with  micropapillary (40%) component  The carcinoma invades muscularis propria (detrusor muscle)   B. PROSTATE CHIPS, TURP:  High grade urothelial carcinoma  The carcinoma involving adjacent prostatic ducts and stroma    11/18/2023 Initial Diagnosis   Urothelial carcinoma of bladder (HCC)   02/03/2024 -  Chemotherapy   Patient is on Treatment Plan : BLADDER Gemcitabine  Twice Weekly + XRT        PHYSICAL EXAMINATION: ECOG PERFORMANCE STATUS: 2 - Symptomatic, <50% confined to bed  Vital signs stable General: No acute distress Abdomen: Soft, nontender nondistended.  Relevant data reviewed during this visit included labs.

## 2024-02-16 NOTE — Assessment & Plan Note (Addendum)
 8/19 C1D1 CRT Return weekly with labs.

## 2024-02-17 ENCOUNTER — Inpatient Hospital Stay

## 2024-02-17 ENCOUNTER — Other Ambulatory Visit

## 2024-02-17 ENCOUNTER — Ambulatory Visit
Admission: RE | Admit: 2024-02-17 | Discharge: 2024-02-17 | Disposition: A | Discharge status: Home / Self Care | Source: Ambulatory Visit | Attending: Radiation Oncology | Admitting: Radiation Oncology

## 2024-02-17 ENCOUNTER — Other Ambulatory Visit: Payer: Self-pay

## 2024-02-17 VITALS — BP 106/67 | HR 92 | Temp 98.4°F | Resp 16

## 2024-02-17 DIAGNOSIS — E860 Dehydration: Secondary | ICD-10-CM | POA: Diagnosis not present

## 2024-02-17 DIAGNOSIS — Z5111 Encounter for antineoplastic chemotherapy: Secondary | ICD-10-CM | POA: Diagnosis not present

## 2024-02-17 DIAGNOSIS — C679 Malignant neoplasm of bladder, unspecified: Secondary | ICD-10-CM

## 2024-02-17 DIAGNOSIS — D701 Agranulocytosis secondary to cancer chemotherapy: Secondary | ICD-10-CM | POA: Insufficient documentation

## 2024-02-17 DIAGNOSIS — T451X5A Adverse effect of antineoplastic and immunosuppressive drugs, initial encounter: Secondary | ICD-10-CM | POA: Insufficient documentation

## 2024-02-17 DIAGNOSIS — N3289 Other specified disorders of bladder: Secondary | ICD-10-CM | POA: Insufficient documentation

## 2024-02-17 DIAGNOSIS — Z510 Encounter for antineoplastic radiation therapy: Secondary | ICD-10-CM | POA: Insufficient documentation

## 2024-02-17 DIAGNOSIS — D61818 Other pancytopenia: Secondary | ICD-10-CM | POA: Diagnosis not present

## 2024-02-17 DIAGNOSIS — Z79899 Other long term (current) drug therapy: Secondary | ICD-10-CM | POA: Insufficient documentation

## 2024-02-17 DIAGNOSIS — R197 Diarrhea, unspecified: Secondary | ICD-10-CM | POA: Insufficient documentation

## 2024-02-17 LAB — CBC WITH DIFFERENTIAL (CANCER CENTER ONLY)
Abs Immature Granulocytes: 0.01 K/uL (ref 0.00–0.07)
Basophils Absolute: 0 K/uL (ref 0.0–0.1)
Basophils Relative: 1 %
Eosinophils Absolute: 0 K/uL (ref 0.0–0.5)
Eosinophils Relative: 1 %
HCT: 35.1 % — ABNORMAL LOW (ref 39.0–52.0)
Hemoglobin: 12.1 g/dL — ABNORMAL LOW (ref 13.0–17.0)
Immature Granulocytes: 1 %
Lymphocytes Relative: 31 %
Lymphs Abs: 0.6 K/uL — ABNORMAL LOW (ref 0.7–4.0)
MCH: 30.6 pg (ref 26.0–34.0)
MCHC: 34.5 g/dL (ref 30.0–36.0)
MCV: 88.9 fL (ref 80.0–100.0)
Monocytes Absolute: 0.1 K/uL (ref 0.1–1.0)
Monocytes Relative: 4 %
Neutro Abs: 1.2 K/uL — ABNORMAL LOW (ref 1.7–7.7)
Neutrophils Relative %: 62 %
Platelet Count: 79 K/uL — ABNORMAL LOW (ref 150–400)
RBC: 3.95 MIL/uL — ABNORMAL LOW (ref 4.22–5.81)
RDW: 13.7 % (ref 11.5–15.5)
WBC Count: 1.9 K/uL — ABNORMAL LOW (ref 4.0–10.5)
nRBC: 0 % (ref 0.0–0.2)

## 2024-02-17 LAB — CMP (CANCER CENTER ONLY)
ALT: 15 U/L (ref 0–44)
AST: 24 U/L (ref 15–41)
Albumin: 3.5 g/dL (ref 3.5–5.0)
Alkaline Phosphatase: 54 U/L (ref 38–126)
Anion gap: 6 (ref 5–15)
BUN: 19 mg/dL (ref 8–23)
CO2: 27 mmol/L (ref 22–32)
Calcium: 8.5 mg/dL — ABNORMAL LOW (ref 8.9–10.3)
Chloride: 105 mmol/L (ref 98–111)
Creatinine: 1.12 mg/dL (ref 0.61–1.24)
GFR, Estimated: >60 mL/min (ref >60)
Glucose, Bld: 113 mg/dL — ABNORMAL HIGH (ref 70–99)
Potassium: 4 mmol/L (ref 3.5–5.1)
Sodium: 138 mmol/L (ref 135–145)
Total Bilirubin: 0.6 mg/dL (ref 0.0–1.2)
Total Protein: 6.7 g/dL (ref 6.5–8.1)

## 2024-02-17 LAB — RAD ONC ARIA SESSION SUMMARY
Course Elapsed Days: 14
Plan Fractions Treated to Date: 10
Plan Prescribed Dose Per Fraction: 1.8 Gy
Plan Total Fractions Prescribed: 11
Plan Total Prescribed Dose: 19.8 Gy
Reference Point Dosage Given to Date: 18 Gy
Reference Point Session Dosage Given: 1.8 Gy
Session Number: 10

## 2024-02-17 MED ORDER — OXYBUTYNIN CHLORIDE 5 MG PO TABS: 5.0000 mg | ORAL_TABLET | Freq: Two times a day (BID) | ORAL | 0 refills | Status: DC | PRN

## 2024-02-17 MED ORDER — SODIUM CHLORIDE 0.9% FLUSH
10.0000 mL | Freq: Once | INTRAVENOUS | Status: AC
  Administered 2024-02-17: 10 mL

## 2024-02-17 NOTE — Assessment & Plan Note (Addendum)
 Hold treatment this week Will need to monitor labs weekly.

## 2024-02-17 NOTE — Progress Notes (Signed)
 Per Dr. Tina, treatment canceled today due to ANC 1.2 and platelets 79,000.

## 2024-02-17 NOTE — Assessment & Plan Note (Addendum)
 Trial of oxybutynin   5 mg 2 times daily as needed

## 2024-02-18 ENCOUNTER — Other Ambulatory Visit: Payer: Self-pay

## 2024-02-18 ENCOUNTER — Ambulatory Visit
Admission: RE | Admit: 2024-02-18 | Discharge: 2024-02-18 | Disposition: A | Discharge status: Home / Self Care | Source: Ambulatory Visit | Attending: Radiation Oncology

## 2024-02-18 DIAGNOSIS — N3289 Other specified disorders of bladder: Secondary | ICD-10-CM | POA: Diagnosis not present

## 2024-02-18 DIAGNOSIS — C679 Malignant neoplasm of bladder, unspecified: Secondary | ICD-10-CM | POA: Diagnosis not present

## 2024-02-18 DIAGNOSIS — Z79899 Other long term (current) drug therapy: Secondary | ICD-10-CM | POA: Diagnosis not present

## 2024-02-18 DIAGNOSIS — Z5111 Encounter for antineoplastic chemotherapy: Secondary | ICD-10-CM | POA: Diagnosis not present

## 2024-02-18 DIAGNOSIS — Z510 Encounter for antineoplastic radiation therapy: Secondary | ICD-10-CM | POA: Diagnosis not present

## 2024-02-18 DIAGNOSIS — D61818 Other pancytopenia: Secondary | ICD-10-CM | POA: Diagnosis not present

## 2024-02-18 LAB — RAD ONC ARIA SESSION SUMMARY
Course Elapsed Days: 15
Plan Fractions Treated to Date: 11
Plan Prescribed Dose Per Fraction: 1.8 Gy
Plan Total Fractions Prescribed: 11
Plan Total Prescribed Dose: 19.8 Gy
Reference Point Dosage Given to Date: 19.8 Gy
Reference Point Session Dosage Given: 1.8 Gy
Session Number: 11

## 2024-02-19 ENCOUNTER — Ambulatory Visit
Admission: RE | Admit: 2024-02-19 | Discharge: 2024-02-19 | Disposition: A | Discharge status: Home / Self Care | Source: Ambulatory Visit | Attending: Radiation Oncology | Admitting: Radiation Oncology

## 2024-02-19 ENCOUNTER — Other Ambulatory Visit: Payer: Self-pay

## 2024-02-19 DIAGNOSIS — Z79899 Other long term (current) drug therapy: Secondary | ICD-10-CM | POA: Diagnosis not present

## 2024-02-19 DIAGNOSIS — Z5111 Encounter for antineoplastic chemotherapy: Secondary | ICD-10-CM | POA: Diagnosis not present

## 2024-02-19 DIAGNOSIS — N3289 Other specified disorders of bladder: Secondary | ICD-10-CM | POA: Diagnosis not present

## 2024-02-19 DIAGNOSIS — D61818 Other pancytopenia: Secondary | ICD-10-CM | POA: Diagnosis not present

## 2024-02-19 DIAGNOSIS — C679 Malignant neoplasm of bladder, unspecified: Secondary | ICD-10-CM | POA: Diagnosis not present

## 2024-02-19 DIAGNOSIS — Z510 Encounter for antineoplastic radiation therapy: Secondary | ICD-10-CM | POA: Diagnosis not present

## 2024-02-19 LAB — RAD ONC ARIA SESSION SUMMARY
Course Elapsed Days: 16
Plan Fractions Treated to Date: 1
Plan Prescribed Dose Per Fraction: 1.8 Gy
Plan Total Fractions Prescribed: 25
Plan Total Prescribed Dose: 45 Gy
Reference Point Dosage Given to Date: 1.8 Gy
Reference Point Session Dosage Given: 1.8 Gy
Session Number: 12

## 2024-02-20 ENCOUNTER — Ambulatory Visit
Admission: RE | Admit: 2024-02-20 | Discharge: 2024-02-20 | Disposition: A | Discharge status: Home / Self Care | Source: Ambulatory Visit | Attending: Radiation Oncology | Admitting: Radiation Oncology

## 2024-02-20 ENCOUNTER — Other Ambulatory Visit: Payer: Self-pay

## 2024-02-20 ENCOUNTER — Inpatient Hospital Stay

## 2024-02-20 DIAGNOSIS — Z79899 Other long term (current) drug therapy: Secondary | ICD-10-CM | POA: Diagnosis not present

## 2024-02-20 DIAGNOSIS — Z5111 Encounter for antineoplastic chemotherapy: Secondary | ICD-10-CM | POA: Diagnosis not present

## 2024-02-20 DIAGNOSIS — C679 Malignant neoplasm of bladder, unspecified: Secondary | ICD-10-CM | POA: Diagnosis not present

## 2024-02-20 DIAGNOSIS — D61818 Other pancytopenia: Secondary | ICD-10-CM | POA: Diagnosis not present

## 2024-02-20 DIAGNOSIS — N3289 Other specified disorders of bladder: Secondary | ICD-10-CM | POA: Diagnosis not present

## 2024-02-20 DIAGNOSIS — Z510 Encounter for antineoplastic radiation therapy: Secondary | ICD-10-CM | POA: Diagnosis not present

## 2024-02-20 LAB — RAD ONC ARIA SESSION SUMMARY
Course Elapsed Days: 17
Plan Fractions Treated to Date: 2
Plan Prescribed Dose Per Fraction: 1.8 Gy
Plan Total Fractions Prescribed: 25
Plan Total Prescribed Dose: 45 Gy
Reference Point Dosage Given to Date: 3.6 Gy
Reference Point Session Dosage Given: 1.8 Gy
Session Number: 13

## 2024-02-23 ENCOUNTER — Ambulatory Visit
Admission: RE | Admit: 2024-02-23 | Discharge: 2024-02-23 | Disposition: A | Discharge status: Home / Self Care | Source: Ambulatory Visit | Attending: Radiation Oncology

## 2024-02-23 ENCOUNTER — Other Ambulatory Visit: Payer: Self-pay

## 2024-02-23 DIAGNOSIS — N3289 Other specified disorders of bladder: Secondary | ICD-10-CM | POA: Diagnosis not present

## 2024-02-23 DIAGNOSIS — D61818 Other pancytopenia: Secondary | ICD-10-CM | POA: Diagnosis not present

## 2024-02-23 DIAGNOSIS — Z510 Encounter for antineoplastic radiation therapy: Secondary | ICD-10-CM | POA: Diagnosis not present

## 2024-02-23 DIAGNOSIS — Z79899 Other long term (current) drug therapy: Secondary | ICD-10-CM | POA: Diagnosis not present

## 2024-02-23 DIAGNOSIS — C679 Malignant neoplasm of bladder, unspecified: Secondary | ICD-10-CM | POA: Diagnosis not present

## 2024-02-23 DIAGNOSIS — Z5111 Encounter for antineoplastic chemotherapy: Secondary | ICD-10-CM | POA: Diagnosis not present

## 2024-02-23 LAB — RAD ONC ARIA SESSION SUMMARY
Course Elapsed Days: 20
Plan Fractions Treated to Date: 3
Plan Prescribed Dose Per Fraction: 1.8 Gy
Plan Total Fractions Prescribed: 25
Plan Total Prescribed Dose: 45 Gy
Reference Point Dosage Given to Date: 5.4 Gy
Reference Point Session Dosage Given: 1.8 Gy
Session Number: 14

## 2024-02-23 NOTE — Progress Notes (Unsigned)
 [facility] OFFICE PROGRESS NOTE  Patient Care Team: Benjamine Aland, MD as PCP - General (Family Medicine) Kristie Lamprey, MD as Consulting Physician (Gastroenterology)  Kenneth Sanford is a 83 y.o.male with history of BPH with TURP, CKD3, arthritis, hypothyroidism [facility].   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 The carcinoma involving adjacent prostatic ducts and stroma. CT reports negative for metastatic disease. Clinical cT4aN0M0 stage IIIA.   After discussion he elected on CRT.   Plan on six weeks of low dose gemcitabine .  Held week 3 due to worsening pancytopenia. Assessment & Plan   No orders of the defined types were placed in this encounter.    [name], MD  INTERVAL HISTORY: Patient returns for follow-up.  Oncology History  Urothelial carcinoma of bladder (HCC)  09/14/2021 Pathology Results   Prostate, chips BENIGN NODULAR GLANDULAR AND STROMAL HYPERPLASIA MILD CHRONIC PROSTATITIS NEGATIVE FOR CARCINOMA   09/15/2023 Cancer Staging   Staging form: Urinary Bladder, AJCC 8th Edition - Clinical stage from 09/15/2023: Stage IIIA (cT4a, cN0, cM0) - Signed by Sherwood Rise, PA-C on 01/15/2024 Stage prefix: Initial diagnosis WHO/ISUP grade (low/high): High Grade Histologic grading system: 2 grade system   10/13/2023 Imaging   CT AP w/wo  At least 2 small enhancing mural nodules along the right lateral bladder wall suspicious for bladder carcinoma..  Markedly enlarged prostate gland which indents the bladder base. Asymmetric soft tissue density again seen protruding from the right lateral prostate base and right seminal vesicle. Prostate carcinoma with invasino of right seminal vesicle cannot be excluded.  No evidence of metastatic disease.  No evidenced of upper urinary tract neoplasm, urolithiasis or hydronephrosis.    10/31/2023 Pathology Results   A. BLADDER,  TUMOR, TURBT:  Infiltrating high grade papillary urothelial carcinoma with  micropapillary (40%) component  The carcinoma invades muscularis propria (detrusor muscle)   B. PROSTATE CHIPS, TURP:  High grade urothelial carcinoma  The carcinoma involving adjacent prostatic ducts and stroma    11/18/2023 Initial Diagnosis   Urothelial carcinoma of bladder (HCC)   02/03/2024 -  Chemotherapy   Patient is on Treatment Plan : BLADDER Gemcitabine  Twice Weekly + XRT        PHYSICAL EXAMINATION: ECOG PERFORMANCE STATUS: {CHL ONC ECOG PS:[id number]}  There were no vitals filed for this visit. There were no vitals filed for this visit.  GENERAL: alert, no distress and comfortable SKIN: skin color normal and no bruising or petechiae or jaundice on exposed skin EYES: normal, sclera clear OROPHARYNX: no exudate  NECK: No palpable mass LYMPH:  no palpable cervical, axillary lymphadenopathy  LUNGS: clear to auscultation and percussion with normal breathing effort HEART: regular rate & rhythm  ABDOMEN: abdomen soft, non-tender and nondistended. Musculoskeletal: no edema NEURO: no focal motor/sensory deficits  Relevant data reviewed during this visit included ***

## 2024-02-24 ENCOUNTER — Other Ambulatory Visit: Payer: Self-pay

## 2024-02-24 ENCOUNTER — Inpatient Hospital Stay

## 2024-02-24 ENCOUNTER — Inpatient Hospital Stay (HOSPITAL_BASED_OUTPATIENT_CLINIC_OR_DEPARTMENT_OTHER)

## 2024-02-24 ENCOUNTER — Ambulatory Visit
Admission: RE | Admit: 2024-02-24 | Discharge: 2024-02-24 | Disposition: A | Discharge status: Home / Self Care | Source: Ambulatory Visit | Attending: Radiation Oncology

## 2024-02-24 ENCOUNTER — Other Ambulatory Visit

## 2024-02-24 VITALS — BP 118/76 | HR 88 | Temp 97.7°F | Resp 17 | Ht 65.0 in | Wt 142.0 lb

## 2024-02-24 DIAGNOSIS — C679 Malignant neoplasm of bladder, unspecified: Secondary | ICD-10-CM

## 2024-02-24 DIAGNOSIS — N3289 Other specified disorders of bladder: Secondary | ICD-10-CM | POA: Diagnosis not present

## 2024-02-24 DIAGNOSIS — Z510 Encounter for antineoplastic radiation therapy: Secondary | ICD-10-CM | POA: Diagnosis not present

## 2024-02-24 DIAGNOSIS — Z79899 Other long term (current) drug therapy: Secondary | ICD-10-CM | POA: Diagnosis not present

## 2024-02-24 DIAGNOSIS — D61818 Other pancytopenia: Secondary | ICD-10-CM | POA: Diagnosis not present

## 2024-02-24 DIAGNOSIS — Z5111 Encounter for antineoplastic chemotherapy: Secondary | ICD-10-CM | POA: Diagnosis not present

## 2024-02-24 LAB — CBC WITH DIFFERENTIAL (CANCER CENTER ONLY)
Abs Immature Granulocytes: 0.01 K/uL (ref 0.00–0.07)
Basophils Absolute: 0 K/uL (ref 0.0–0.1)
Basophils Relative: 0 %
Eosinophils Absolute: 0 K/uL (ref 0.0–0.5)
Eosinophils Relative: 2 %
HCT: 35.8 % — ABNORMAL LOW (ref 39.0–52.0)
Hemoglobin: 12.3 g/dL — ABNORMAL LOW (ref 13.0–17.0)
Immature Granulocytes: 0 %
Lymphocytes Relative: 28 %
Lymphs Abs: 0.7 K/uL (ref 0.7–4.0)
MCH: 31.1 pg (ref 26.0–34.0)
MCHC: 34.4 g/dL (ref 30.0–36.0)
MCV: 90.4 fL (ref 80.0–100.0)
Monocytes Absolute: 0.2 K/uL (ref 0.1–1.0)
Monocytes Relative: 10 %
Neutro Abs: 1.4 K/uL — ABNORMAL LOW (ref 1.7–7.7)
Neutrophils Relative %: 60 %
Platelet Count: 193 K/uL (ref 150–400)
RBC: 3.96 MIL/uL — ABNORMAL LOW (ref 4.22–5.81)
RDW: 14.5 % (ref 11.5–15.5)
WBC Count: 2.4 K/uL — ABNORMAL LOW (ref 4.0–10.5)
nRBC: 0 % (ref 0.0–0.2)

## 2024-02-24 LAB — CMP (CANCER CENTER ONLY)
ALT: 12 U/L (ref 0–44)
AST: 15 U/L (ref 15–41)
Albumin: 3.7 g/dL (ref 3.5–5.0)
Alkaline Phosphatase: 59 U/L (ref 38–126)
Anion gap: 6 (ref 5–15)
BUN: 22 mg/dL (ref 8–23)
CO2: 28 mmol/L (ref 22–32)
Calcium: 8.6 mg/dL — ABNORMAL LOW (ref 8.9–10.3)
Chloride: 106 mmol/L (ref 98–111)
Creatinine: 1.63 mg/dL — ABNORMAL HIGH (ref 0.61–1.24)
GFR, Estimated: 41 mL/min — ABNORMAL LOW (ref >60)
Glucose, Bld: 93 mg/dL (ref 70–99)
Potassium: 4.2 mmol/L (ref 3.5–5.1)
Sodium: 140 mmol/L (ref 135–145)
Total Bilirubin: 0.6 mg/dL (ref 0.0–1.2)
Total Protein: 6.9 g/dL (ref 6.5–8.1)

## 2024-02-24 LAB — RAD ONC ARIA SESSION SUMMARY
Course Elapsed Days: 21
Plan Fractions Treated to Date: 4
Plan Prescribed Dose Per Fraction: 1.8 Gy
Plan Total Fractions Prescribed: 25
Plan Total Prescribed Dose: 45 Gy
Reference Point Dosage Given to Date: 7.2 Gy
Reference Point Session Dosage Given: 1.8 Gy
Session Number: 15

## 2024-02-24 MED ORDER — PROCHLORPERAZINE MALEATE 10 MG PO TABS
10.0000 mg | ORAL_TABLET | Freq: Once | ORAL | Status: AC
  Administered 2024-02-24: 10 mg via ORAL
  Filled 2024-02-24: qty 1

## 2024-02-24 MED ORDER — SODIUM CHLORIDE 0.9 % IV SOLN
27.0000 mg/m2 | Freq: Once | INTRAVENOUS | Status: AC
  Administered 2024-02-24: 46 mg via INTRAVENOUS
  Filled 2024-02-24: qty 1.21

## 2024-02-24 MED ORDER — SODIUM CHLORIDE 0.9 % IV SOLN: INTRAVENOUS | Status: DC

## 2024-02-24 NOTE — Assessment & Plan Note (Addendum)
 8/19 C1D1 CRT Proceed with chemotherapy this week.  Patient will continue radiation. Return weekly with labs.

## 2024-02-24 NOTE — Assessment & Plan Note (Addendum)
 Trial of oxybutynin   5 mg 2 times daily as needed

## 2024-02-24 NOTE — Patient Instructions (Signed)
 [facility]  Discharge Instructions: Thank you for choosing [facility] to provide your oncology and hematology care.   If you have a lab appointment with the Cancer Center, please go directly to the Cancer Center and check in at the registration area.   Wear comfortable clothing and clothing appropriate for easy access to any Portacath or PICC line.   We strive to give you quality time with your provider. You may need to reschedule your appointment if you arrive late (15 or more minutes).  Arriving late affects you and other patients whose appointments are after yours.  Also, if you miss three or more appointments without notifying the office, you may be dismissed from the clinic at the provider's discretion.      For prescription refill requests, have your pharmacy contact our office and allow 72 hours for refills to be completed.    Today you received the following chemotherapy and/or immunotherapy agents gemzar      To help prevent nausea and vomiting after your treatment, we encourage you to take your nausea medication as directed.  BELOW ARE SYMPTOMS THAT SHOULD BE REPORTED IMMEDIATELY: *FEVER GREATER THAN 100.4 F (38 C) OR HIGHER *CHILLS OR SWEATING *NAUSEA AND VOMITING THAT IS NOT CONTROLLED WITH YOUR NAUSEA MEDICATION *UNUSUAL SHORTNESS OF BREATH *UNUSUAL BRUISING OR BLEEDING *URINARY PROBLEMS (pain or burning when urinating, or frequent urination) *BOWEL PROBLEMS (unusual diarrhea, constipation, pain near the anus) TENDERNESS IN MOUTH AND THROAT WITH OR WITHOUT PRESENCE OF ULCERS (sore throat, sores in mouth, or a toothache) UNUSUAL RASH, SWELLING OR PAIN  UNUSUAL VAGINAL DISCHARGE OR ITCHING   Items with * indicate a potential emergency and should be followed up as soon as possible or go to the Emergency Department if any problems should occur.  Please show the CHEMOTHERAPY ALERT CARD or IMMUNOTHERAPY  ALERT CARD at check-in to the Emergency Department and triage nurse.  Should you have questions after your visit or need to cancel or reschedule your appointment, please contact [facility] - [facility]  Dept: [phone]  and follow the prompts.  Office hours are 8:00 a.m. to 4:30 p.m. Monday - Friday. Please note that voicemails left after 4:00 p.m. may not be returned until the following business day.  We are closed weekends and major holidays. You have access to a nurse at all times for urgent questions. Please call the main number to the clinic Dept: [phone] and follow the prompts.   For any non-urgent questions, you may also contact your provider using MyChart. We now offer e-Visits for anyone 19 and older to request care online for non-urgent symptoms. For details visit [email or web address].   Also download the MyChart app! Go to the app store, search "MyChart", open the app, select [place], and log in with your MyChart username and password.

## 2024-02-25 ENCOUNTER — Other Ambulatory Visit: Payer: Self-pay

## 2024-02-25 ENCOUNTER — Ambulatory Visit
Admission: RE | Admit: 2024-02-25 | Discharge: 2024-02-25 | Disposition: A | Discharge status: Home / Self Care | Source: Ambulatory Visit | Attending: Radiation Oncology | Admitting: Radiation Oncology

## 2024-02-25 DIAGNOSIS — Z5111 Encounter for antineoplastic chemotherapy: Secondary | ICD-10-CM | POA: Diagnosis not present

## 2024-02-25 DIAGNOSIS — Z79899 Other long term (current) drug therapy: Secondary | ICD-10-CM | POA: Diagnosis not present

## 2024-02-25 DIAGNOSIS — N3289 Other specified disorders of bladder: Secondary | ICD-10-CM | POA: Diagnosis not present

## 2024-02-25 DIAGNOSIS — Z510 Encounter for antineoplastic radiation therapy: Secondary | ICD-10-CM | POA: Diagnosis not present

## 2024-02-25 DIAGNOSIS — D61818 Other pancytopenia: Secondary | ICD-10-CM | POA: Diagnosis not present

## 2024-02-25 DIAGNOSIS — C679 Malignant neoplasm of bladder, unspecified: Secondary | ICD-10-CM | POA: Diagnosis not present

## 2024-02-25 LAB — RAD ONC ARIA SESSION SUMMARY
Course Elapsed Days: 22
Plan Fractions Treated to Date: 5
Plan Prescribed Dose Per Fraction: 1.8 Gy
Plan Total Fractions Prescribed: 25
Plan Total Prescribed Dose: 45 Gy
Reference Point Dosage Given to Date: 9 Gy
Reference Point Session Dosage Given: 1.8 Gy
Session Number: 16

## 2024-02-26 ENCOUNTER — Ambulatory Visit
Admission: RE | Admit: 2024-02-26 | Discharge: 2024-02-26 | Disposition: A | Discharge status: Home / Self Care | Source: Ambulatory Visit | Attending: Radiation Oncology | Admitting: Radiation Oncology

## 2024-02-26 ENCOUNTER — Ambulatory Visit
Admission: RE | Admit: 2024-02-26 | Discharge: 2024-02-26 | Disposition: A | Discharge status: Home / Self Care | Source: Ambulatory Visit | Attending: Radiation Oncology

## 2024-02-26 ENCOUNTER — Other Ambulatory Visit: Payer: Self-pay

## 2024-02-26 DIAGNOSIS — N3289 Other specified disorders of bladder: Secondary | ICD-10-CM | POA: Diagnosis not present

## 2024-02-26 DIAGNOSIS — Z510 Encounter for antineoplastic radiation therapy: Secondary | ICD-10-CM | POA: Diagnosis not present

## 2024-02-26 DIAGNOSIS — Z5111 Encounter for antineoplastic chemotherapy: Secondary | ICD-10-CM | POA: Diagnosis not present

## 2024-02-26 DIAGNOSIS — Z79899 Other long term (current) drug therapy: Secondary | ICD-10-CM | POA: Diagnosis not present

## 2024-02-26 DIAGNOSIS — D61818 Other pancytopenia: Secondary | ICD-10-CM | POA: Diagnosis not present

## 2024-02-26 DIAGNOSIS — C679 Malignant neoplasm of bladder, unspecified: Secondary | ICD-10-CM | POA: Diagnosis not present

## 2024-02-26 LAB — RAD ONC ARIA SESSION SUMMARY
Course Elapsed Days: 23
Plan Fractions Treated to Date: 6
Plan Prescribed Dose Per Fraction: 1.8 Gy
Plan Total Fractions Prescribed: 25
Plan Total Prescribed Dose: 45 Gy
Reference Point Dosage Given to Date: 10.8 Gy
Reference Point Session Dosage Given: 1.8 Gy
Session Number: 17

## 2024-02-27 ENCOUNTER — Inpatient Hospital Stay

## 2024-02-27 ENCOUNTER — Other Ambulatory Visit: Payer: Self-pay

## 2024-02-27 ENCOUNTER — Ambulatory Visit

## 2024-02-27 ENCOUNTER — Ambulatory Visit
Admission: RE | Admit: 2024-02-27 | Discharge: 2024-02-27 | Disposition: A | Discharge status: Home / Self Care | Source: Ambulatory Visit | Attending: Radiation Oncology | Admitting: Radiation Oncology

## 2024-02-27 VITALS — BP 100/70 | HR 83 | Temp 97.5°F | Resp 17

## 2024-02-27 DIAGNOSIS — D61818 Other pancytopenia: Secondary | ICD-10-CM | POA: Diagnosis not present

## 2024-02-27 DIAGNOSIS — Z510 Encounter for antineoplastic radiation therapy: Secondary | ICD-10-CM | POA: Diagnosis not present

## 2024-02-27 DIAGNOSIS — Z79899 Other long term (current) drug therapy: Secondary | ICD-10-CM | POA: Diagnosis not present

## 2024-02-27 DIAGNOSIS — N3289 Other specified disorders of bladder: Secondary | ICD-10-CM | POA: Diagnosis not present

## 2024-02-27 DIAGNOSIS — C679 Malignant neoplasm of bladder, unspecified: Secondary | ICD-10-CM | POA: Diagnosis not present

## 2024-02-27 DIAGNOSIS — Z5111 Encounter for antineoplastic chemotherapy: Secondary | ICD-10-CM | POA: Diagnosis not present

## 2024-02-27 LAB — RAD ONC ARIA SESSION SUMMARY
Course Elapsed Days: 24
Plan Fractions Treated to Date: 7
Plan Prescribed Dose Per Fraction: 1.8 Gy
Plan Total Fractions Prescribed: 25
Plan Total Prescribed Dose: 45 Gy
Reference Point Dosage Given to Date: 12.6 Gy
Reference Point Session Dosage Given: 1.8 Gy
Session Number: 18

## 2024-02-27 MED ORDER — SODIUM CHLORIDE 0.9 % IV SOLN
27.0000 mg/m2 | Freq: Once | INTRAVENOUS | Status: AC
  Administered 2024-02-27: 46 mg via INTRAVENOUS
  Filled 2024-02-27: qty 1.21

## 2024-02-27 MED ORDER — SODIUM CHLORIDE 0.9 % IV SOLN: INTRAVENOUS | Status: DC

## 2024-02-27 MED ORDER — PROCHLORPERAZINE MALEATE 10 MG PO TABS
10.0000 mg | ORAL_TABLET | Freq: Once | ORAL | Status: AC
  Administered 2024-02-27: 10 mg via ORAL
  Filled 2024-02-27: qty 1

## 2024-02-27 NOTE — Patient Instructions (Signed)
 [facility]  Discharge Instructions: Thank you for choosing [facility] to provide your oncology and hematology care.   If you have a lab appointment with the Cancer Center, please go directly to the Cancer Center and check in at the registration area.   Wear comfortable clothing and clothing appropriate for easy access to any Portacath or PICC line.   We strive to give you quality time with your provider. You may need to reschedule your appointment if you arrive late (15 or more minutes).  Arriving late affects you and other patients whose appointments are after yours.  Also, if you miss three or more appointments without notifying the office, you may be dismissed from the clinic at the provider's discretion.      For prescription refill requests, have your pharmacy contact our office and allow 72 hours for refills to be completed.    Today you received the following chemotherapy and/or immunotherapy agents: Gemcitabine (Gemzar)      To help prevent nausea and vomiting after your treatment, we encourage you to take your nausea medication as directed.  BELOW ARE SYMPTOMS THAT SHOULD BE REPORTED IMMEDIATELY: *FEVER GREATER THAN 100.4 F (38 C) OR HIGHER *CHILLS OR SWEATING *NAUSEA AND VOMITING THAT IS NOT CONTROLLED WITH YOUR NAUSEA MEDICATION *UNUSUAL SHORTNESS OF BREATH *UNUSUAL BRUISING OR BLEEDING *URINARY PROBLEMS (pain or burning when urinating, or frequent urination) *BOWEL PROBLEMS (unusual diarrhea, constipation, pain near the anus) TENDERNESS IN MOUTH AND THROAT WITH OR WITHOUT PRESENCE OF ULCERS (sore throat, sores in mouth, or a toothache) UNUSUAL RASH, SWELLING OR PAIN  UNUSUAL VAGINAL DISCHARGE OR ITCHING   Items with * indicate a potential emergency and should be followed up as soon as possible or go to the Emergency Department if any problems should occur.  Please show the CHEMOTHERAPY ALERT CARD or  IMMUNOTHERAPY ALERT CARD at check-in to the Emergency Department and triage nurse.  Should you have questions after your visit or need to cancel or reschedule your appointment, please contact [facility]  Dept: [phone]  and follow the prompts.  Office hours are 8:00 a.m. to 4:30 p.m. Monday - Friday. Please note that voicemails left after 4:00 p.m. may not be returned until the following business day.  We are closed weekends and major holidays. You have access to a nurse at all times for urgent questions. Please call the main number to the clinic Dept: [phone] and follow the prompts.   For any non-urgent questions, you may also contact your provider using MyChart. We now offer e-Visits for anyone 93 and older to request care online for non-urgent symptoms. For details visit [email or web address].   Also download the MyChart app! Go to the app store, search "MyChart", open the app, select [place], and log in with your MyChart username and password.

## 2024-03-01 ENCOUNTER — Ambulatory Visit
Admission: RE | Admit: 2024-03-01 | Discharge: 2024-03-01 | Disposition: A | Discharge status: Home / Self Care | Source: Ambulatory Visit | Attending: Radiation Oncology

## 2024-03-01 ENCOUNTER — Other Ambulatory Visit: Payer: Self-pay

## 2024-03-01 DIAGNOSIS — N3289 Other specified disorders of bladder: Secondary | ICD-10-CM | POA: Diagnosis not present

## 2024-03-01 DIAGNOSIS — Z5111 Encounter for antineoplastic chemotherapy: Secondary | ICD-10-CM | POA: Diagnosis not present

## 2024-03-01 DIAGNOSIS — Z79899 Other long term (current) drug therapy: Secondary | ICD-10-CM | POA: Diagnosis not present

## 2024-03-01 DIAGNOSIS — C679 Malignant neoplasm of bladder, unspecified: Secondary | ICD-10-CM | POA: Diagnosis not present

## 2024-03-01 DIAGNOSIS — D61818 Other pancytopenia: Secondary | ICD-10-CM | POA: Diagnosis not present

## 2024-03-01 DIAGNOSIS — Z510 Encounter for antineoplastic radiation therapy: Secondary | ICD-10-CM | POA: Diagnosis not present

## 2024-03-01 LAB — RAD ONC ARIA SESSION SUMMARY
Course Elapsed Days: 27
Plan Fractions Treated to Date: 8
Plan Prescribed Dose Per Fraction: 1.8 Gy
Plan Total Fractions Prescribed: 25
Plan Total Prescribed Dose: 45 Gy
Reference Point Dosage Given to Date: 14.4 Gy
Reference Point Session Dosage Given: 1.8 Gy
Session Number: 19

## 2024-03-01 NOTE — Assessment & Plan Note (Signed)
 8/19 C1D1 CRT Omitted week 3 of chemotherapy due to pancytopenia Proceed with chemotherapy this week.  Patient will continue radiation. Return weekly with labs.

## 2024-03-01 NOTE — Progress Notes (Unsigned)
 [facility] OFFICE PROGRESS NOTE  Patient Care Team: Kenneth Aland, MD as PCP - General (Family Medicine) Kristie Lamprey, MD as Consulting Physician (Gastroenterology)  Kenneth Sanford is a 83 y.o.male with history of BPH with TURP, CKD3, arthritis, hypothyroidism [facility].   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 The carcinoma involving adjacent prostatic ducts and stroma. CT reports negative for metastatic disease. Clinical cT4aN0M0 stage IIIA.   After discussion he elected on CRT.   Plan on six weeks of low dose gemcitabine .   Held week 3 due to worsening pancytopenia.  Counts have improved and will resume treatment this week.   Discussed continue to increase fluid intake.  Week 5 this week.  Overall tolerating well.  Patient will proceed with treatment today. Assessment & Plan Urothelial carcinoma of bladder (HCC) 8/19 C1D1 CRT Omitted week 3 of chemotherapy due to pancytopenia Proceed with chemotherapy this week.  Patient will continue radiation. Return weekly with labs. Chemotherapy-induced neutropenia (HCC) Stable. Ok to treat today Repeat CBC on Friday. Hold Friday's treatment if ANC < 1.0   Kenneth Sanford Chihuahua, MD  INTERVAL HISTORY: Patient returns for follow-up.  Overall feeling well.  No fever, chills or signs of infection.  No abdominal pain, pelvic pain, coughing or shortness of breath.  May not be drinking and eating well as well.  1 episode of bladder spasm resolved with Ditropan  without side effects.  Oncology History  Urothelial carcinoma of bladder (HCC)  09/14/2021 Pathology Results   Prostate, chips BENIGN NODULAR GLANDULAR AND STROMAL HYPERPLASIA MILD CHRONIC PROSTATITIS NEGATIVE FOR CARCINOMA   09/15/2023 Cancer Staging   Staging form: Urinary Bladder, AJCC 8th Edition - Clinical stage from 09/15/2023: Stage IIIA (cT4a, cN0, cM0) - Signed by Kenneth Rise,  PA-C on 01/15/2024 Stage prefix: Initial diagnosis WHO/ISUP grade (low/high): High Grade Histologic grading system: 2 grade system   10/13/2023 Imaging   CT AP w/wo  At least 2 small enhancing mural nodules along the right lateral bladder wall suspicious for bladder carcinoma..  Markedly enlarged prostate gland which indents the bladder base. Asymmetric soft tissue density again seen protruding from the right lateral prostate base and right seminal vesicle. Prostate carcinoma with invasino of right seminal vesicle cannot be excluded.  No evidence of metastatic disease.  No evidenced of upper urinary tract neoplasm, urolithiasis or hydronephrosis.    10/31/2023 Pathology Results   A. BLADDER, TUMOR, TURBT:  Infiltrating high grade papillary urothelial carcinoma with  micropapillary (40%) component  The carcinoma invades muscularis propria (detrusor muscle)   B. PROSTATE CHIPS, TURP:  High grade urothelial carcinoma  The carcinoma involving adjacent prostatic ducts and stroma    11/18/2023 Initial Diagnosis   Urothelial carcinoma of bladder (HCC)   02/03/2024 -  Chemotherapy   Patient is on Treatment Plan : BLADDER Gemcitabine  Twice Weekly + XRT      Past Surgical History:  Procedure Laterality Date   ABDOMINAL SURGERY     HERNIA REPAIR     IR IMAGING GUIDED PORT INSERTION  01/23/2024   KNEE SURGERY Bilateral    PROSTATE BIOPSY     TRANSURETHRAL RESECTION OF BLADDER TUMOR N/A 10/31/2023   Procedure: TURBT (TRANSURETHRAL RESECTION OF BLADDER TUMOR);  Surgeon: Carolee Kenneth Kenneth DOUGLAS, MD;  Location: [facility];  Service: Urology;  Laterality: N/A;   TRANSURETHRAL RESECTION OF PROSTATE N/A 10/31/2023   Procedure: TURP (TRANSURETHRAL RESECTION OF PROSTATE);  Surgeon: Carolee Kenneth Kenneth DOUGLAS, MD;  Location: [facility];  Service: Urology;  Laterality: N/A;     PHYSICAL EXAMINATION: ECOG PERFORMANCE STATUS: 2 - Symptomatic, <50% confined to bed VSS  GENERAL: alert, no distress and comfortable LUNGS:  clear to auscultation and percussion with normal breathing effort HEART: regular rate & rhythm  ABDOMEN: abdomen soft, non-tender and nondistended.   Relevant data reviewed during this visit included labs.

## 2024-03-02 ENCOUNTER — Other Ambulatory Visit: Payer: Self-pay

## 2024-03-02 ENCOUNTER — Ambulatory Visit
Admission: RE | Admit: 2024-03-02 | Discharge: 2024-03-02 | Disposition: A | Discharge status: Home / Self Care | Source: Ambulatory Visit | Attending: Radiation Oncology

## 2024-03-02 ENCOUNTER — Inpatient Hospital Stay

## 2024-03-02 ENCOUNTER — Other Ambulatory Visit

## 2024-03-02 ENCOUNTER — Inpatient Hospital Stay (HOSPITAL_BASED_OUTPATIENT_CLINIC_OR_DEPARTMENT_OTHER)

## 2024-03-02 DIAGNOSIS — T451X5A Adverse effect of antineoplastic and immunosuppressive drugs, initial encounter: Secondary | ICD-10-CM

## 2024-03-02 DIAGNOSIS — D701 Agranulocytosis secondary to cancer chemotherapy: Secondary | ICD-10-CM | POA: Diagnosis not present

## 2024-03-02 DIAGNOSIS — D61818 Other pancytopenia: Secondary | ICD-10-CM | POA: Diagnosis not present

## 2024-03-02 DIAGNOSIS — Z79899 Other long term (current) drug therapy: Secondary | ICD-10-CM | POA: Diagnosis not present

## 2024-03-02 DIAGNOSIS — C679 Malignant neoplasm of bladder, unspecified: Secondary | ICD-10-CM

## 2024-03-02 DIAGNOSIS — N3289 Other specified disorders of bladder: Secondary | ICD-10-CM | POA: Diagnosis not present

## 2024-03-02 DIAGNOSIS — Z5111 Encounter for antineoplastic chemotherapy: Secondary | ICD-10-CM | POA: Diagnosis not present

## 2024-03-02 DIAGNOSIS — D709 Neutropenia, unspecified: Secondary | ICD-10-CM | POA: Insufficient documentation

## 2024-03-02 DIAGNOSIS — Z510 Encounter for antineoplastic radiation therapy: Secondary | ICD-10-CM | POA: Diagnosis not present

## 2024-03-02 LAB — CBC WITH DIFFERENTIAL (CANCER CENTER ONLY)
Abs Immature Granulocytes: 0.01 K/uL (ref 0.00–0.07)
Basophils Absolute: 0 K/uL (ref 0.0–0.1)
Basophils Relative: 1 %
Eosinophils Absolute: 0 K/uL (ref 0.0–0.5)
Eosinophils Relative: 2 %
HCT: 35.8 % — ABNORMAL LOW (ref 39.0–52.0)
Hemoglobin: 12 g/dL — ABNORMAL LOW (ref 13.0–17.0)
Immature Granulocytes: 1 %
Lymphocytes Relative: 22 %
Lymphs Abs: 0.4 K/uL — ABNORMAL LOW (ref 0.7–4.0)
MCH: 30.8 pg (ref 26.0–34.0)
MCHC: 33.5 g/dL (ref 30.0–36.0)
MCV: 91.8 fL (ref 80.0–100.0)
Monocytes Absolute: 0.1 K/uL (ref 0.1–1.0)
Monocytes Relative: 6 %
Neutro Abs: 1.2 K/uL — ABNORMAL LOW (ref 1.7–7.7)
Neutrophils Relative %: 68 %
Platelet Count: 221 K/uL (ref 150–400)
RBC: 3.9 MIL/uL — ABNORMAL LOW (ref 4.22–5.81)
RDW: 14.1 % (ref 11.5–15.5)
WBC Count: 1.7 K/uL — ABNORMAL LOW (ref 4.0–10.5)
nRBC: 0 % (ref 0.0–0.2)

## 2024-03-02 LAB — RAD ONC ARIA SESSION SUMMARY
Course Elapsed Days: 28
Plan Fractions Treated to Date: 9
Plan Prescribed Dose Per Fraction: 1.8 Gy
Plan Total Fractions Prescribed: 25
Plan Total Prescribed Dose: 45 Gy
Reference Point Dosage Given to Date: 16.2 Gy
Reference Point Session Dosage Given: 1.8 Gy
Session Number: 20

## 2024-03-02 LAB — CMP (CANCER CENTER ONLY)
ALT: 10 U/L (ref 0–44)
AST: 17 U/L (ref 15–41)
Albumin: 3.9 g/dL (ref 3.5–5.0)
Alkaline Phosphatase: 63 U/L (ref 38–126)
Anion gap: 6 (ref 5–15)
BUN: 24 mg/dL — ABNORMAL HIGH (ref 8–23)
CO2: 28 mmol/L (ref 22–32)
Calcium: 8.8 mg/dL — ABNORMAL LOW (ref 8.9–10.3)
Chloride: 104 mmol/L (ref 98–111)
Creatinine: 1.85 mg/dL — ABNORMAL HIGH (ref 0.61–1.24)
GFR, Estimated: 35 mL/min — ABNORMAL LOW (ref >60)
Glucose, Bld: 111 mg/dL — ABNORMAL HIGH (ref 70–99)
Potassium: 4 mmol/L (ref 3.5–5.1)
Sodium: 138 mmol/L (ref 135–145)
Total Bilirubin: 0.6 mg/dL (ref 0.0–1.2)
Total Protein: 7.2 g/dL (ref 6.5–8.1)

## 2024-03-02 MED ORDER — SODIUM CHLORIDE 0.9 % IV SOLN: INTRAVENOUS | Status: DC

## 2024-03-02 MED ORDER — PROCHLORPERAZINE MALEATE 10 MG PO TABS
10.0000 mg | ORAL_TABLET | Freq: Once | ORAL | Status: AC
  Administered 2024-03-02: 10 mg via ORAL
  Filled 2024-03-02: qty 1

## 2024-03-02 MED ORDER — SODIUM CHLORIDE 0.9 % IV SOLN
27.0000 mg/m2 | Freq: Once | INTRAVENOUS | Status: AC
  Administered 2024-03-02: 46 mg via INTRAVENOUS
  Filled 2024-03-02: qty 1.21

## 2024-03-02 MED ORDER — SODIUM CHLORIDE 0.9 % IV SOLN: Freq: Once | INTRAVENOUS | Status: AC

## 2024-03-02 NOTE — Patient Instructions (Addendum)
 [facility] - A DEPT OF [facility]  Discharge Instructions: Thank you for choosing [facility] to provide your oncology and hematology care.   If you have a lab appointment with the Cancer Center, please go directly to the Cancer Center and check in at the registration area.   Wear comfortable clothing and clothing appropriate for easy access to any Portacath or PICC line.   We strive to give you quality time with your provider. You may need to reschedule your appointment if you arrive late (15 or more minutes).  Arriving late affects you and other patients whose appointments are after yours.  Also, if you miss three or more appointments without notifying the office, you may be dismissed from the clinic at the provider's discretion.      For prescription refill requests, have your pharmacy contact our office and allow 72 hours for refills to be completed.    Today you received the following chemotherapy and/or immunotherapy agents: Gemzar       To help prevent nausea and vomiting after your treatment, we encourage you to take your nausea medication as directed.  BELOW ARE SYMPTOMS THAT SHOULD BE REPORTED IMMEDIATELY: *FEVER GREATER THAN 100.4 F (38 C) OR HIGHER *CHILLS OR SWEATING *NAUSEA AND VOMITING THAT IS NOT CONTROLLED WITH YOUR NAUSEA MEDICATION *UNUSUAL SHORTNESS OF BREATH *UNUSUAL BRUISING OR BLEEDING *URINARY PROBLEMS (pain or burning when urinating, or frequent urination) *BOWEL PROBLEMS (unusual diarrhea, constipation, pain near the anus) TENDERNESS IN MOUTH AND THROAT WITH OR WITHOUT PRESENCE OF ULCERS (sore throat, sores in mouth, or a toothache) UNUSUAL RASH, SWELLING OR PAIN  UNUSUAL VAGINAL DISCHARGE OR ITCHING   Items with * indicate a potential emergency and should be followed up as soon as possible or go to the Emergency Department if any problems should occur.  Please show the CHEMOTHERAPY ALERT CARD or IMMUNOTHERAPY  ALERT CARD at check-in to the Emergency Department and triage nurse.  Should you have questions after your visit or need to cancel or reschedule your appointment, please contact [facility] - A DEPT OF [facility]  Dept: [phone]  and follow the prompts.  Office hours are 8:00 a.m. to 4:30 p.m. Monday - Friday. Please note that voicemails left after 4:00 p.m. may not be returned until the following business day.  We are closed weekends and major holidays. You have access to a nurse at all times for urgent questions. Please call the main number to the clinic Dept: [phone] and follow the prompts.   For any non-urgent questions, you may also contact your provider using MyChart. We now offer e-Visits for anyone 11 and older to request care online for non-urgent symptoms. For details visit [email or web address].   Also download the MyChart app! Go to the app store, search MyChart, open the app, select [place], and log in with your MyChart username and password.  Rehydration, Older Adult  Rehydration is the replacement of fluids, salts, and minerals in the body (electrolytes) that are lost during dehydration. Dehydration is when there is not enough water or other fluids in the body. This happens when you lose more fluids than you take in. People who are age 84 or older have a higher risk of dehydration than younger adults. This is because in older age, the body: Is less able to maintain the right amount of water. Does not respond to temperature changes as well. Does not get  a sense of thirst as easily or quickly. Other causes include: Not drinking enough fluids. This can occur when you are ill, when you forget to drink, or when you are doing activities that require a lot of energy, especially in hot weather. Conditions that cause loss of water or other fluids. These include diarrhea, vomiting, sweating, or urinating a lot. Other illnesses, such as fever  or infection. Certain medicines, such as those that remove excess fluid from the body (diuretics). Symptoms of mild or moderate dehydration may include thirst, dry lips and mouth, and dizziness. Symptoms of severe dehydration may include increased heart rate, confusion, fainting, and not urinating. In severe cases, you may need to get fluids through an IV at the hospital. For mild or moderate cases, you can usually rehydrate at home by drinking certain fluids as told by your health care provider. What are the risks? Rehydration is usually safe. Taking in too much fluid (overhydration) can be a problem but is rare. Overhydration can cause an imbalance of electrolytes in the body, kidney failure, fluid in the lungs, or a decrease in salt (sodium) levels in the body. Supplies needed: You will need an oral rehydration solution (ORS) if your health care provider tells you to use one. This is a drink to treat dehydration. It can be found in pharmacies and retail stores. How to rehydrate Fluids Follow instructions from your health care provider about what to drink. The kind of fluid and the amount you should drink depend on your condition. In general, you should choose drinks that you prefer. If told by your health care provider, drink an ORS. Make an ORS by following instructions on the package. Start by drinking small amounts, about  cup (120 mL) every 5-10 minutes. Slowly increase how much you drink until you have taken in the amount recommended by your health care provider. Drink enough clear fluids to keep your urine pale yellow. If you were told to drink an ORS, finish it first, then start slowly drinking other clear fluids. Drink fluids such as: Water. This includes sparkling and flavored water. Drinking only water can lead to having too little sodium in your body (hyponatremia). Follow the advice of your health care provider. Water from ice chips you suck on. Fruit juice with water added to  it(diluted). Sports drinks. Hot or cold herbal teas. Broth-based soups. Coffee. Milk or milk products. Food Follow instructions from your health care provider about what to eat while you rehydrate. Your health care provider may recommend that you slowly begin eating regular foods in small amounts. Eat foods that contain a healthy balance of electrolytes, such as bananas, oranges, potatoes, tomatoes, and spinach. Avoid foods that are greasy or contain a lot of sugar. In some cases, you may get nutrition through a feeding tube that is passed through your nose and into your stomach (nasogastric tube, or NG tube). This may be done if you have uncontrolled vomiting or diarrhea. Drinks to avoid  Certain drinks may make dehydration worse. While you rehydrate, avoid drinking alcohol. How to tell if you are recovering from dehydration You may be getting better if: You are urinating more often than before you started rehydrating. Your urine is pale yellow. Your energy level improves. You vomit less often. You have diarrhea less often. Your appetite improves or returns to normal. You feel less dizzy or light-headed. Your skin tone and color start to look more normal. Follow these instructions at home: Take over-the-counter and prescription medicines only as  told by your health care provider. Do not take sodium tablets. Doing this can lead to having too much sodium in your body (hypernatremia). Contact a health care provider if: You continue to have symptoms of mild or moderate dehydration, such as: Thirst. Dry lips. Slightly dry mouth. Dizziness. Dark urine or less urine than usual. Muscle cramps. You continue to vomit or have diarrhea. Get help right away if: You have symptoms of dehydration that get worse. You have a fever. You have a severe headache. You have been vomiting and have problems, such as: Your vomiting gets worse. Your vomit includes blood or green matter (bile). You  cannot eat or drink without vomiting. You have problems with urination or bowel movements, such as: Diarrhea that gets worse. Blood in your stool (feces). This may cause stool to look black and tarry. Not urinating, or urinating only a small amount of very dark urine, within 6-8 hours. You have trouble breathing. You have symptoms that get worse with treatment. These symptoms may be an emergency. Get help right away. Call 911. Do not wait to see if the symptoms will go away. Do not drive yourself to the hospital. This information is not intended to replace advice given to you by your health care provider. Make sure you discuss any questions you have with your health care provider. Document Revised: 10/17/2021 Document Reviewed: 10/15/2021 Elsevier Patient Education  2024 [employer].

## 2024-03-02 NOTE — Assessment & Plan Note (Addendum)
 Stable. Ok to treat today Repeat CBC on Friday. Hold Friday's treatment if ANC < 1.0

## 2024-03-03 ENCOUNTER — Other Ambulatory Visit: Payer: Self-pay

## 2024-03-03 ENCOUNTER — Ambulatory Visit
Admission: RE | Admit: 2024-03-03 | Discharge: 2024-03-03 | Disposition: A | Discharge status: Home / Self Care | Source: Ambulatory Visit | Attending: Radiation Oncology

## 2024-03-03 DIAGNOSIS — Z510 Encounter for antineoplastic radiation therapy: Secondary | ICD-10-CM | POA: Diagnosis not present

## 2024-03-03 DIAGNOSIS — D61818 Other pancytopenia: Secondary | ICD-10-CM | POA: Diagnosis not present

## 2024-03-03 DIAGNOSIS — Z79899 Other long term (current) drug therapy: Secondary | ICD-10-CM | POA: Diagnosis not present

## 2024-03-03 DIAGNOSIS — Z5111 Encounter for antineoplastic chemotherapy: Secondary | ICD-10-CM | POA: Diagnosis not present

## 2024-03-03 DIAGNOSIS — C679 Malignant neoplasm of bladder, unspecified: Secondary | ICD-10-CM | POA: Diagnosis not present

## 2024-03-03 DIAGNOSIS — N3289 Other specified disorders of bladder: Secondary | ICD-10-CM | POA: Diagnosis not present

## 2024-03-03 LAB — RAD ONC ARIA SESSION SUMMARY
Course Elapsed Days: 29
Plan Fractions Treated to Date: 10
Plan Prescribed Dose Per Fraction: 1.8 Gy
Plan Total Fractions Prescribed: 25
Plan Total Prescribed Dose: 45 Gy
Reference Point Dosage Given to Date: 18 Gy
Reference Point Session Dosage Given: 1.8 Gy
Session Number: 21

## 2024-03-04 ENCOUNTER — Ambulatory Visit
Admission: RE | Admit: 2024-03-04 | Discharge: 2024-03-04 | Disposition: A | Discharge status: Home / Self Care | Source: Ambulatory Visit | Attending: Radiation Oncology

## 2024-03-04 ENCOUNTER — Ambulatory Visit

## 2024-03-04 ENCOUNTER — Other Ambulatory Visit: Payer: Self-pay

## 2024-03-04 DIAGNOSIS — C679 Malignant neoplasm of bladder, unspecified: Secondary | ICD-10-CM | POA: Diagnosis not present

## 2024-03-04 DIAGNOSIS — D61818 Other pancytopenia: Secondary | ICD-10-CM | POA: Diagnosis not present

## 2024-03-04 DIAGNOSIS — N3289 Other specified disorders of bladder: Secondary | ICD-10-CM | POA: Diagnosis not present

## 2024-03-04 DIAGNOSIS — Z79899 Other long term (current) drug therapy: Secondary | ICD-10-CM | POA: Diagnosis not present

## 2024-03-04 DIAGNOSIS — Z510 Encounter for antineoplastic radiation therapy: Secondary | ICD-10-CM | POA: Diagnosis not present

## 2024-03-04 DIAGNOSIS — Z5111 Encounter for antineoplastic chemotherapy: Secondary | ICD-10-CM | POA: Diagnosis not present

## 2024-03-04 LAB — RAD ONC ARIA SESSION SUMMARY
Course Elapsed Days: 30
Plan Fractions Treated to Date: 11
Plan Prescribed Dose Per Fraction: 1.8 Gy
Plan Total Fractions Prescribed: 25
Plan Total Prescribed Dose: 45 Gy
Reference Point Dosage Given to Date: 19.8 Gy
Reference Point Session Dosage Given: 1.8 Gy
Session Number: 22

## 2024-03-05 ENCOUNTER — Ambulatory Visit: Payer: Self-pay

## 2024-03-05 ENCOUNTER — Ambulatory Visit

## 2024-03-05 ENCOUNTER — Other Ambulatory Visit: Payer: Self-pay

## 2024-03-05 ENCOUNTER — Ambulatory Visit
Admission: RE | Admit: 2024-03-05 | Discharge: 2024-03-05 | Disposition: A | Discharge status: Home / Self Care | Source: Ambulatory Visit | Attending: Radiation Oncology | Admitting: Radiation Oncology

## 2024-03-05 ENCOUNTER — Inpatient Hospital Stay

## 2024-03-05 ENCOUNTER — Other Ambulatory Visit

## 2024-03-05 VITALS — BP 111/84 | HR 81 | Temp 97.7°F

## 2024-03-05 DIAGNOSIS — Z510 Encounter for antineoplastic radiation therapy: Secondary | ICD-10-CM | POA: Diagnosis not present

## 2024-03-05 DIAGNOSIS — C679 Malignant neoplasm of bladder, unspecified: Secondary | ICD-10-CM | POA: Diagnosis not present

## 2024-03-05 DIAGNOSIS — N3289 Other specified disorders of bladder: Secondary | ICD-10-CM | POA: Diagnosis not present

## 2024-03-05 DIAGNOSIS — Z5111 Encounter for antineoplastic chemotherapy: Secondary | ICD-10-CM | POA: Diagnosis not present

## 2024-03-05 DIAGNOSIS — D61818 Other pancytopenia: Secondary | ICD-10-CM | POA: Diagnosis not present

## 2024-03-05 DIAGNOSIS — Z79899 Other long term (current) drug therapy: Secondary | ICD-10-CM | POA: Diagnosis not present

## 2024-03-05 LAB — CMP (CANCER CENTER ONLY)
ALT: 13 U/L (ref 0–44)
AST: 20 U/L (ref 15–41)
Albumin: 3.7 g/dL (ref 3.5–5.0)
Alkaline Phosphatase: 62 U/L (ref 38–126)
Anion gap: 6 (ref 5–15)
BUN: 23 mg/dL (ref 8–23)
CO2: 27 mmol/L (ref 22–32)
Calcium: 9.1 mg/dL (ref 8.9–10.3)
Chloride: 106 mmol/L (ref 98–111)
Creatinine: 1.72 mg/dL — ABNORMAL HIGH (ref 0.61–1.24)
GFR, Estimated: 39 mL/min — ABNORMAL LOW (ref >60)
Glucose, Bld: 108 mg/dL — ABNORMAL HIGH (ref 70–99)
Potassium: 4.4 mmol/L (ref 3.5–5.1)
Sodium: 139 mmol/L (ref 135–145)
Total Bilirubin: 0.4 mg/dL (ref 0.0–1.2)
Total Protein: 6.9 g/dL (ref 6.5–8.1)

## 2024-03-05 LAB — RAD ONC ARIA SESSION SUMMARY
Course Elapsed Days: 31
Plan Fractions Treated to Date: 12
Plan Prescribed Dose Per Fraction: 1.8 Gy
Plan Total Fractions Prescribed: 25
Plan Total Prescribed Dose: 45 Gy
Reference Point Dosage Given to Date: 21.6 Gy
Reference Point Session Dosage Given: 1.8 Gy
Session Number: 23

## 2024-03-05 LAB — CBC WITH DIFFERENTIAL (CANCER CENTER ONLY)
Abs Immature Granulocytes: 0 K/uL (ref 0.00–0.07)
Basophils Absolute: 0 K/uL (ref 0.0–0.1)
Basophils Relative: 1 %
Eosinophils Absolute: 0.1 K/uL (ref 0.0–0.5)
Eosinophils Relative: 5 %
HCT: 33.4 % — ABNORMAL LOW (ref 39.0–52.0)
Hemoglobin: 11.4 g/dL — ABNORMAL LOW (ref 13.0–17.0)
Immature Granulocytes: 0 %
Lymphocytes Relative: 22 %
Lymphs Abs: 0.4 K/uL — ABNORMAL LOW (ref 0.7–4.0)
MCH: 31.1 pg (ref 26.0–34.0)
MCHC: 34.1 g/dL (ref 30.0–36.0)
MCV: 91 fL (ref 80.0–100.0)
Monocytes Absolute: 0.1 K/uL (ref 0.1–1.0)
Monocytes Relative: 7 %
Neutro Abs: 1.1 K/uL — ABNORMAL LOW (ref 1.7–7.7)
Neutrophils Relative %: 65 %
Platelet Count: 152 K/uL (ref 150–400)
RBC: 3.67 MIL/uL — ABNORMAL LOW (ref 4.22–5.81)
RDW: 14.3 % (ref 11.5–15.5)
WBC Count: 1.7 K/uL — ABNORMAL LOW (ref 4.0–10.5)
nRBC: 0 % (ref 0.0–0.2)

## 2024-03-05 MED ORDER — SODIUM CHLORIDE 0.9 % IV SOLN: INTRAVENOUS | Status: DC

## 2024-03-05 MED ORDER — SODIUM CHLORIDE 0.9 % IV SOLN
27.0000 mg/m2 | Freq: Once | INTRAVENOUS | Status: AC
  Administered 2024-03-05: 46 mg via INTRAVENOUS
  Filled 2024-03-05: qty 1.21

## 2024-03-05 MED ORDER — PROCHLORPERAZINE MALEATE 10 MG PO TABS
10.0000 mg | ORAL_TABLET | Freq: Once | ORAL | Status: AC
  Administered 2024-03-05: 10 mg via ORAL
  Filled 2024-03-05: qty 1

## 2024-03-05 NOTE — Patient Instructions (Signed)
 [facility]  Discharge Instructions: Thank you for choosing [facility] to provide your oncology and hematology care.   If you have a lab appointment with the Cancer Center, please go directly to the Cancer Center and check in at the registration area.   Wear comfortable clothing and clothing appropriate for easy access to any Portacath or PICC line.   We strive to give you quality time with your provider. You may need to reschedule your appointment if you arrive late (15 or more minutes).  Arriving late affects you and other patients whose appointments are after yours.  Also, if you miss three or more appointments without notifying the office, you may be dismissed from the clinic at the provider's discretion.      For prescription refill requests, have your pharmacy contact our office and allow 72 hours for refills to be completed.    Today you received the following chemotherapy and/or immunotherapy agents: Gemcitabine (Gemzar)      To help prevent nausea and vomiting after your treatment, we encourage you to take your nausea medication as directed.  BELOW ARE SYMPTOMS THAT SHOULD BE REPORTED IMMEDIATELY: *FEVER GREATER THAN 100.4 F (38 C) OR HIGHER *CHILLS OR SWEATING *NAUSEA AND VOMITING THAT IS NOT CONTROLLED WITH YOUR NAUSEA MEDICATION *UNUSUAL SHORTNESS OF BREATH *UNUSUAL BRUISING OR BLEEDING *URINARY PROBLEMS (pain or burning when urinating, or frequent urination) *BOWEL PROBLEMS (unusual diarrhea, constipation, pain near the anus) TENDERNESS IN MOUTH AND THROAT WITH OR WITHOUT PRESENCE OF ULCERS (sore throat, sores in mouth, or a toothache) UNUSUAL RASH, SWELLING OR PAIN  UNUSUAL VAGINAL DISCHARGE OR ITCHING   Items with * indicate a potential emergency and should be followed up as soon as possible or go to the Emergency Department if any problems should occur.  Please show the CHEMOTHERAPY ALERT CARD or  IMMUNOTHERAPY ALERT CARD at check-in to the Emergency Department and triage nurse.  Should you have questions after your visit or need to cancel or reschedule your appointment, please contact [facility]  Dept: [phone]  and follow the prompts.  Office hours are 8:00 a.m. to 4:30 p.m. Monday - Friday. Please note that voicemails left after 4:00 p.m. may not be returned until the following business day.  We are closed weekends and major holidays. You have access to a nurse at all times for urgent questions. Please call the main number to the clinic Dept: [phone] and follow the prompts.   For any non-urgent questions, you may also contact your provider using MyChart. We now offer e-Visits for anyone 93 and older to request care online for non-urgent symptoms. For details visit [email or web address].   Also download the MyChart app! Go to the app store, search "MyChart", open the app, select [place], and log in with your MyChart username and password.

## 2024-03-08 ENCOUNTER — Ambulatory Visit
Admission: RE | Admit: 2024-03-08 | Discharge: 2024-03-08 | Disposition: A | Discharge status: Home / Self Care | Source: Ambulatory Visit | Attending: Radiation Oncology | Admitting: Radiation Oncology

## 2024-03-08 ENCOUNTER — Other Ambulatory Visit: Payer: Self-pay

## 2024-03-08 DIAGNOSIS — Z5111 Encounter for antineoplastic chemotherapy: Secondary | ICD-10-CM | POA: Diagnosis not present

## 2024-03-08 DIAGNOSIS — D6481 Anemia due to antineoplastic chemotherapy: Secondary | ICD-10-CM | POA: Insufficient documentation

## 2024-03-08 DIAGNOSIS — Z510 Encounter for antineoplastic radiation therapy: Secondary | ICD-10-CM | POA: Diagnosis not present

## 2024-03-08 DIAGNOSIS — C679 Malignant neoplasm of bladder, unspecified: Secondary | ICD-10-CM | POA: Diagnosis not present

## 2024-03-08 DIAGNOSIS — D61818 Other pancytopenia: Secondary | ICD-10-CM | POA: Diagnosis not present

## 2024-03-08 DIAGNOSIS — Z79899 Other long term (current) drug therapy: Secondary | ICD-10-CM | POA: Diagnosis not present

## 2024-03-08 DIAGNOSIS — N3289 Other specified disorders of bladder: Secondary | ICD-10-CM | POA: Diagnosis not present

## 2024-03-08 LAB — RAD ONC ARIA SESSION SUMMARY
Course Elapsed Days: 34
Plan Fractions Treated to Date: 13
Plan Prescribed Dose Per Fraction: 1.8 Gy
Plan Total Fractions Prescribed: 25
Plan Total Prescribed Dose: 45 Gy
Reference Point Dosage Given to Date: 23.4 Gy
Reference Point Session Dosage Given: 1.8 Gy
Session Number: 24

## 2024-03-08 NOTE — Assessment & Plan Note (Addendum)
 8/19 C1D1 CRT Omitted week 3 of chemotherapy due to pancytopenia Week 6. Proceed with chemotherapy this week.  Patient will continue radiation. Return weekly with labs.

## 2024-03-08 NOTE — Assessment & Plan Note (Signed)
 Labs showed

## 2024-03-08 NOTE — Assessment & Plan Note (Addendum)
 Stable. Ok to treat today Repeat CBC on Friday. Hold Friday's treatment if ANC < 1.0

## 2024-03-08 NOTE — Progress Notes (Unsigned)
 [facility] OFFICE PROGRESS NOTE  Patient Care Team: Benjamine Aland, MD as PCP - General (Family Medicine) Kristie Lamprey, MD as Consulting Physician (Gastroenterology)  Mabry is a 83 y.o.male with history of BPH with TURP, CKD3, arthritis, hypothyroidism [facility].   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 The carcinoma involving adjacent prostatic ducts and stroma. CT reports negative for metastatic disease. Clinical cT4aN0M0 stage IIIA.   After discussion he elected on CRT.   Plan on six weeks of low dose gemcitabine . Assessment & Plan Urothelial carcinoma of bladder (HCC) 8/19 C1D1 CRT Omitted week 3 of chemotherapy due to pancytopenia Week 6. Proceed with chemotherapy this week.  Patient will continue radiation. Return weekly with labs. Other neutropenia Stable. Ok to treat today Repeat CBC on Friday. Hold Friday's treatment if ANC < 1.0  No orders of the defined types were placed in this encounter.    [name], MD  INTERVAL HISTORY: Patient returns for follow-up.  Oncology History  Urothelial carcinoma of bladder (HCC)  09/14/2021 Pathology Results   Prostate, chips BENIGN NODULAR GLANDULAR AND STROMAL HYPERPLASIA MILD CHRONIC PROSTATITIS NEGATIVE FOR CARCINOMA   09/15/2023 Cancer Staging   Staging form: Urinary Bladder, AJCC 8th Edition - Clinical stage from 09/15/2023: Stage IIIA (cT4a, cN0, cM0) - Signed by Sherwood Rise, PA-C on 01/15/2024 Stage prefix: Initial diagnosis WHO/ISUP grade (low/high): High Grade Histologic grading system: 2 grade system   10/13/2023 Imaging   CT AP w/wo  At least 2 small enhancing mural nodules along the right lateral bladder wall suspicious for bladder carcinoma..  Markedly enlarged prostate gland which indents the bladder base. Asymmetric soft tissue density again seen protruding from the right lateral prostate base and  right seminal vesicle. Prostate carcinoma with invasino of right seminal vesicle cannot be excluded.  No evidence of metastatic disease.  No evidenced of upper urinary tract neoplasm, urolithiasis or hydronephrosis.    10/31/2023 Pathology Results   A. BLADDER, TUMOR, TURBT:  Infiltrating high grade papillary urothelial carcinoma with  micropapillary (40%) component  The carcinoma invades muscularis propria (detrusor muscle)   B. PROSTATE CHIPS, TURP:  High grade urothelial carcinoma  The carcinoma involving adjacent prostatic ducts and stroma    11/18/2023 Initial Diagnosis   Urothelial carcinoma of bladder (HCC)   02/03/2024 -  Chemotherapy   Patient is on Treatment Plan : BLADDER Gemcitabine  Twice Weekly + XRT        PHYSICAL EXAMINATION: ECOG PERFORMANCE STATUS: {CHL ONC ECOG PS:[phone]}  There were no vitals filed for this visit. There were no vitals filed for this visit.  GENERAL: alert, no distress and comfortable SKIN: skin color normal and no jaundice or bruising or petechiae on exposed skin EYES: normal, sclera clear OROPHARYNX: no exudate  NECK: No palpable mass LYMPH:  no palpable cervical, axillary lymphadenopathy  LUNGS: clear to auscultation and no wheeze or rales with normal breathing effort HEART: regular rate & rhythm  ABDOMEN: abdomen soft, non-tender and nondistended. Musculoskeletal: no edema NEURO: no focal motor/sensory deficits  Relevant data reviewed during this visit included labs.  New labs ordered.

## 2024-03-09 ENCOUNTER — Inpatient Hospital Stay

## 2024-03-09 ENCOUNTER — Other Ambulatory Visit

## 2024-03-09 ENCOUNTER — Other Ambulatory Visit: Payer: Self-pay

## 2024-03-09 ENCOUNTER — Inpatient Hospital Stay (HOSPITAL_BASED_OUTPATIENT_CLINIC_OR_DEPARTMENT_OTHER)

## 2024-03-09 ENCOUNTER — Ambulatory Visit
Admission: RE | Admit: 2024-03-09 | Discharge: 2024-03-09 | Disposition: A | Discharge status: Home / Self Care | Source: Ambulatory Visit | Attending: Radiation Oncology

## 2024-03-09 VITALS — BP 102/77 | HR 93 | Temp 97.9°F | Resp 16 | Ht 65.0 in | Wt 141.0 lb

## 2024-03-09 DIAGNOSIS — D708 Other neutropenia: Secondary | ICD-10-CM

## 2024-03-09 DIAGNOSIS — D6481 Anemia due to antineoplastic chemotherapy: Secondary | ICD-10-CM | POA: Diagnosis not present

## 2024-03-09 DIAGNOSIS — T451X5A Adverse effect of antineoplastic and immunosuppressive drugs, initial encounter: Secondary | ICD-10-CM | POA: Diagnosis not present

## 2024-03-09 DIAGNOSIS — C679 Malignant neoplasm of bladder, unspecified: Secondary | ICD-10-CM

## 2024-03-09 DIAGNOSIS — Z510 Encounter for antineoplastic radiation therapy: Secondary | ICD-10-CM | POA: Diagnosis not present

## 2024-03-09 DIAGNOSIS — E860 Dehydration: Secondary | ICD-10-CM | POA: Diagnosis not present

## 2024-03-09 DIAGNOSIS — Z5111 Encounter for antineoplastic chemotherapy: Secondary | ICD-10-CM | POA: Diagnosis not present

## 2024-03-09 DIAGNOSIS — Z79899 Other long term (current) drug therapy: Secondary | ICD-10-CM | POA: Diagnosis not present

## 2024-03-09 DIAGNOSIS — D696 Thrombocytopenia, unspecified: Secondary | ICD-10-CM

## 2024-03-09 DIAGNOSIS — D61818 Other pancytopenia: Secondary | ICD-10-CM | POA: Diagnosis not present

## 2024-03-09 DIAGNOSIS — N3289 Other specified disorders of bladder: Secondary | ICD-10-CM | POA: Diagnosis not present

## 2024-03-09 LAB — RAD ONC ARIA SESSION SUMMARY
Course Elapsed Days: 35
Plan Fractions Treated to Date: 14
Plan Prescribed Dose Per Fraction: 1.8 Gy
Plan Total Fractions Prescribed: 25
Plan Total Prescribed Dose: 45 Gy
Reference Point Dosage Given to Date: 25.2 Gy
Reference Point Session Dosage Given: 1.8 Gy
Session Number: 25

## 2024-03-09 LAB — CMP (CANCER CENTER ONLY)
ALT: 12 U/L (ref 0–44)
AST: 15 U/L (ref 15–41)
Albumin: 3.7 g/dL (ref 3.5–5.0)
Alkaline Phosphatase: 57 U/L (ref 38–126)
Anion gap: 7 (ref 5–15)
BUN: 24 mg/dL — ABNORMAL HIGH (ref 8–23)
CO2: 27 mmol/L (ref 22–32)
Calcium: 8.7 mg/dL — ABNORMAL LOW (ref 8.9–10.3)
Chloride: 106 mmol/L (ref 98–111)
Creatinine: 1.65 mg/dL — ABNORMAL HIGH (ref 0.61–1.24)
GFR, Estimated: 41 mL/min — ABNORMAL LOW (ref >60)
Glucose, Bld: 109 mg/dL — ABNORMAL HIGH (ref 70–99)
Potassium: 4.4 mmol/L (ref 3.5–5.1)
Sodium: 140 mmol/L (ref 135–145)
Total Bilirubin: 0.4 mg/dL (ref 0.0–1.2)
Total Protein: 6.9 g/dL (ref 6.5–8.1)

## 2024-03-09 LAB — CBC WITH DIFFERENTIAL (CANCER CENTER ONLY)
Abs Immature Granulocytes: 0 K/uL (ref 0.00–0.07)
Basophils Absolute: 0 K/uL (ref 0.0–0.1)
Basophils Relative: 0 %
Eosinophils Absolute: 0.1 K/uL (ref 0.0–0.5)
Eosinophils Relative: 5 %
HCT: 32.9 % — ABNORMAL LOW (ref 39.0–52.0)
Hemoglobin: 11.2 g/dL — ABNORMAL LOW (ref 13.0–17.0)
Immature Granulocytes: 0 %
Lymphocytes Relative: 15 %
Lymphs Abs: 0.3 K/uL — ABNORMAL LOW (ref 0.7–4.0)
MCH: 31.1 pg (ref 26.0–34.0)
MCHC: 34 g/dL (ref 30.0–36.0)
MCV: 91.4 fL (ref 80.0–100.0)
Monocytes Absolute: 0.1 K/uL (ref 0.1–1.0)
Monocytes Relative: 5 %
Neutro Abs: 1.5 K/uL — ABNORMAL LOW (ref 1.7–7.7)
Neutrophils Relative %: 75 %
Platelet Count: 81 K/uL — ABNORMAL LOW (ref 150–400)
RBC: 3.6 MIL/uL — ABNORMAL LOW (ref 4.22–5.81)
RDW: 14.1 % (ref 11.5–15.5)
WBC Count: 2 K/uL — ABNORMAL LOW (ref 4.0–10.5)
nRBC: 0 % (ref 0.0–0.2)

## 2024-03-09 MED ORDER — SODIUM CHLORIDE 0.9 % IV SOLN: Freq: Once | INTRAVENOUS | Status: AC

## 2024-03-09 NOTE — Assessment & Plan Note (Addendum)
 Discussed bleeding precautions. Hold treatment this week. Repeat lab next week.

## 2024-03-09 NOTE — Assessment & Plan Note (Addendum)
 500 mL IV fluid today Advised patient to increase fluid at home.

## 2024-03-09 NOTE — Patient Instructions (Signed)

## 2024-03-10 ENCOUNTER — Ambulatory Visit
Admission: RE | Admit: 2024-03-10 | Discharge: 2024-03-10 | Disposition: A | Discharge status: Home / Self Care | Source: Ambulatory Visit | Attending: Radiation Oncology | Admitting: Radiation Oncology

## 2024-03-10 ENCOUNTER — Other Ambulatory Visit: Payer: Self-pay

## 2024-03-10 DIAGNOSIS — Z510 Encounter for antineoplastic radiation therapy: Secondary | ICD-10-CM | POA: Diagnosis not present

## 2024-03-10 DIAGNOSIS — D61818 Other pancytopenia: Secondary | ICD-10-CM | POA: Diagnosis not present

## 2024-03-10 DIAGNOSIS — N3289 Other specified disorders of bladder: Secondary | ICD-10-CM | POA: Diagnosis not present

## 2024-03-10 DIAGNOSIS — Z79899 Other long term (current) drug therapy: Secondary | ICD-10-CM | POA: Diagnosis not present

## 2024-03-10 DIAGNOSIS — Z5111 Encounter for antineoplastic chemotherapy: Secondary | ICD-10-CM | POA: Diagnosis not present

## 2024-03-10 DIAGNOSIS — C679 Malignant neoplasm of bladder, unspecified: Secondary | ICD-10-CM | POA: Diagnosis not present

## 2024-03-10 LAB — RAD ONC ARIA SESSION SUMMARY
Course Elapsed Days: 36
Plan Fractions Treated to Date: 15
Plan Prescribed Dose Per Fraction: 1.8 Gy
Plan Total Fractions Prescribed: 25
Plan Total Prescribed Dose: 45 Gy
Reference Point Dosage Given to Date: 27 Gy
Reference Point Session Dosage Given: 1.8 Gy
Session Number: 26

## 2024-03-11 ENCOUNTER — Ambulatory Visit
Admission: RE | Admit: 2024-03-11 | Discharge: 2024-03-11 | Disposition: A | Discharge status: Home / Self Care | Source: Ambulatory Visit | Attending: Radiation Oncology | Admitting: Radiation Oncology

## 2024-03-11 ENCOUNTER — Ambulatory Visit
Admission: RE | Admit: 2024-03-11 | Discharge: 2024-03-11 | Disposition: A | Discharge status: Home / Self Care | Source: Ambulatory Visit | Attending: Radiation Oncology

## 2024-03-11 ENCOUNTER — Other Ambulatory Visit: Payer: Self-pay

## 2024-03-11 DIAGNOSIS — Z5111 Encounter for antineoplastic chemotherapy: Secondary | ICD-10-CM | POA: Diagnosis not present

## 2024-03-11 DIAGNOSIS — D61818 Other pancytopenia: Secondary | ICD-10-CM | POA: Diagnosis not present

## 2024-03-11 DIAGNOSIS — Z510 Encounter for antineoplastic radiation therapy: Secondary | ICD-10-CM | POA: Diagnosis not present

## 2024-03-11 DIAGNOSIS — N3289 Other specified disorders of bladder: Secondary | ICD-10-CM | POA: Diagnosis not present

## 2024-03-11 DIAGNOSIS — Z79899 Other long term (current) drug therapy: Secondary | ICD-10-CM | POA: Diagnosis not present

## 2024-03-11 DIAGNOSIS — C679 Malignant neoplasm of bladder, unspecified: Secondary | ICD-10-CM | POA: Diagnosis not present

## 2024-03-11 LAB — RAD ONC ARIA SESSION SUMMARY
Course Elapsed Days: 37
Plan Fractions Treated to Date: 16
Plan Prescribed Dose Per Fraction: 1.8 Gy
Plan Total Fractions Prescribed: 25
Plan Total Prescribed Dose: 45 Gy
Reference Point Dosage Given to Date: 28.8 Gy
Reference Point Session Dosage Given: 1.8 Gy
Session Number: 27

## 2024-03-12 ENCOUNTER — Other Ambulatory Visit: Payer: Self-pay

## 2024-03-12 ENCOUNTER — Telehealth: Payer: Self-pay | Admitting: Radiation Oncology

## 2024-03-12 ENCOUNTER — Inpatient Hospital Stay

## 2024-03-12 ENCOUNTER — Ambulatory Visit
Admission: RE | Admit: 2024-03-12 | Discharge: 2024-03-12 | Disposition: A | Discharge status: Home / Self Care | Source: Ambulatory Visit | Attending: Radiation Oncology | Admitting: Radiation Oncology

## 2024-03-12 DIAGNOSIS — Z510 Encounter for antineoplastic radiation therapy: Secondary | ICD-10-CM | POA: Diagnosis not present

## 2024-03-12 DIAGNOSIS — Z79899 Other long term (current) drug therapy: Secondary | ICD-10-CM | POA: Diagnosis not present

## 2024-03-12 DIAGNOSIS — N3289 Other specified disorders of bladder: Secondary | ICD-10-CM | POA: Diagnosis not present

## 2024-03-12 DIAGNOSIS — C679 Malignant neoplasm of bladder, unspecified: Secondary | ICD-10-CM | POA: Diagnosis not present

## 2024-03-12 DIAGNOSIS — D61818 Other pancytopenia: Secondary | ICD-10-CM | POA: Diagnosis not present

## 2024-03-12 DIAGNOSIS — Z5111 Encounter for antineoplastic chemotherapy: Secondary | ICD-10-CM | POA: Diagnosis not present

## 2024-03-12 LAB — RAD ONC ARIA SESSION SUMMARY
Course Elapsed Days: 38
Plan Fractions Treated to Date: 17
Plan Prescribed Dose Per Fraction: 1.8 Gy
Plan Total Fractions Prescribed: 25
Plan Total Prescribed Dose: 45 Gy
Reference Point Dosage Given to Date: 30.6 Gy
Reference Point Session Dosage Given: 1.8 Gy
Session Number: 28

## 2024-03-12 NOTE — Telephone Encounter (Signed)
 9/26 Patient's daughter called to confirm the time of patient's Radiation treatment for today.  She is aware and grateful for call back.

## 2024-03-15 ENCOUNTER — Ambulatory Visit
Admission: RE | Admit: 2024-03-15 | Discharge: 2024-03-15 | Disposition: A | Discharge status: Home / Self Care | Source: Ambulatory Visit | Attending: Radiation Oncology | Admitting: Radiation Oncology

## 2024-03-15 ENCOUNTER — Other Ambulatory Visit: Payer: Self-pay

## 2024-03-15 DIAGNOSIS — Z510 Encounter for antineoplastic radiation therapy: Secondary | ICD-10-CM | POA: Diagnosis not present

## 2024-03-15 DIAGNOSIS — D61818 Other pancytopenia: Secondary | ICD-10-CM | POA: Diagnosis not present

## 2024-03-15 DIAGNOSIS — C679 Malignant neoplasm of bladder, unspecified: Secondary | ICD-10-CM | POA: Diagnosis not present

## 2024-03-15 DIAGNOSIS — Z79899 Other long term (current) drug therapy: Secondary | ICD-10-CM | POA: Diagnosis not present

## 2024-03-15 DIAGNOSIS — Z5111 Encounter for antineoplastic chemotherapy: Secondary | ICD-10-CM | POA: Diagnosis not present

## 2024-03-15 DIAGNOSIS — N3289 Other specified disorders of bladder: Secondary | ICD-10-CM | POA: Diagnosis not present

## 2024-03-15 LAB — RAD ONC ARIA SESSION SUMMARY
Course Elapsed Days: 41
Plan Fractions Treated to Date: 18
Plan Prescribed Dose Per Fraction: 1.8 Gy
Plan Total Fractions Prescribed: 25
Plan Total Prescribed Dose: 45 Gy
Reference Point Dosage Given to Date: 32.4 Gy
Reference Point Session Dosage Given: 1.8 Gy
Session Number: 29

## 2024-03-15 NOTE — Progress Notes (Unsigned)
 [facility] OFFICE PROGRESS NOTE  Patient Care Team: Benjamine Aland, MD as PCP - General (Family Medicine) Kristie Lamprey, MD as Consulting Physician (Gastroenterology)  Kenneth Sanford is a 83 y.o.male with history of BPH with TURP, CKD3, arthritis, hypothyroidism [facility].   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 The carcinoma involving adjacent prostatic ducts and stroma. CT reports negative for metastatic disease. Clinical cT4aN0M0 stage IIIA.   After discussion he elected on CRT.  Diarrhea morning, slightly lightheadedness.  Blood pressure low normal.  Recommend 1 L fluid before treatment today. Assessment & Plan Urothelial carcinoma of bladder (HCC) 8/19 C1D1 CRT Omitted week 3 & 6 of chemotherapy due to cytopenia Proceed with chemotherapy this week.  Patient will continue radiation. Return in a week for final treatment with labs. Pancytopenia (HCC) Will continue treatment this week Will need repeat lab next week before last treatment Monitor for precautions of bleeding, fever or infection Diarrhea, unspecified type Imodium as needed Dehydration 500 mL IV fluid today Advised patient to increase fluid at home.  Patient will let us  know if new concerning symptoms.  Return next week for final treatment   [name], MD  INTERVAL HISTORY: Patient returns for follow-up.  Overall feeling well.  Diarrhea 3 times this morning.  He consumed about 32 ounce of fluid.  Report took his Imodium after 2 or 3 times.  Little bit of lightheadedness.  No other symptoms.  No abdominal pain, fever.  Oncology History  Urothelial carcinoma of bladder (HCC)  09/14/2021 Pathology Results   Prostate, chips BENIGN NODULAR GLANDULAR AND STROMAL HYPERPLASIA MILD CHRONIC PROSTATITIS NEGATIVE FOR CARCINOMA   09/15/2023 Cancer Staging   Staging form: Urinary Bladder, AJCC 8th Edition - Clinical  stage from 09/15/2023: Stage IIIA (cT4a, cN0, cM0) - Signed by Sherwood Rise, PA-C on 01/15/2024 Stage prefix: Initial diagnosis WHO/ISUP grade (low/high): High Grade Histologic grading system: 2 grade system   10/13/2023 Imaging   CT AP w/wo  At least 2 small enhancing mural nodules along the right lateral bladder wall suspicious for bladder carcinoma..  Markedly enlarged prostate gland which indents the bladder base. Asymmetric soft tissue density again seen protruding from the right lateral prostate base and right seminal vesicle. Prostate carcinoma with invasino of right seminal vesicle cannot be excluded.  No evidence of metastatic disease.  No evidenced of upper urinary tract neoplasm, urolithiasis or hydronephrosis.    10/31/2023 Pathology Results   A. BLADDER, TUMOR, TURBT:  Infiltrating high grade papillary urothelial carcinoma with  micropapillary (40%) component  The carcinoma invades muscularis propria (detrusor muscle)   B. PROSTATE CHIPS, TURP:  High grade urothelial carcinoma  The carcinoma involving adjacent prostatic ducts and stroma    11/18/2023 Initial Diagnosis   Urothelial carcinoma of bladder (HCC)   02/03/2024 -  Chemotherapy   Patient is on Treatment Plan : BLADDER Gemcitabine  Twice Weekly + XRT        PHYSICAL EXAMINATION: ECOG PERFORMANCE STATUS: 2 - Symptomatic, <50% confined to bed  Vitals:   03/16/24 1006  BP: 105/82  Pulse: 78  Resp: 16  Temp: (!) 97.4 F (36.3 C)  SpO2: 100%   There were no vitals filed for this visit.  GENERAL: alert, no distress and comfortable OROPHARYNX: Dry LUNGS: clear to auscultation and no wheeze or rales with normal breathing effort HEART: regular rate & rhythm  ABDOMEN: abdomen soft, non-tender and nondistended. Musculoskeletal: no edema   Relevant  data reviewed during this visit included labs.

## 2024-03-15 NOTE — Assessment & Plan Note (Signed)
 8/19 C1D1 CRT Omitted week 3 & 6 of chemotherapy due to cytopenia Proceed with chemotherapy this week.  Patient will continue radiation. Return in a week for final treatment with labs.

## 2024-03-16 ENCOUNTER — Other Ambulatory Visit: Payer: Self-pay

## 2024-03-16 ENCOUNTER — Other Ambulatory Visit

## 2024-03-16 ENCOUNTER — Inpatient Hospital Stay

## 2024-03-16 ENCOUNTER — Inpatient Hospital Stay (HOSPITAL_BASED_OUTPATIENT_CLINIC_OR_DEPARTMENT_OTHER)

## 2024-03-16 ENCOUNTER — Ambulatory Visit
Admission: RE | Admit: 2024-03-16 | Discharge: 2024-03-16 | Disposition: A | Discharge status: Home / Self Care | Source: Ambulatory Visit | Attending: Radiation Oncology | Admitting: Radiation Oncology

## 2024-03-16 VITALS — BP 105/82 | HR 78 | Temp 97.4°F | Resp 16 | Ht 65.0 in

## 2024-03-16 DIAGNOSIS — D61818 Other pancytopenia: Secondary | ICD-10-CM

## 2024-03-16 DIAGNOSIS — C679 Malignant neoplasm of bladder, unspecified: Secondary | ICD-10-CM

## 2024-03-16 DIAGNOSIS — E860 Dehydration: Secondary | ICD-10-CM

## 2024-03-16 DIAGNOSIS — N3289 Other specified disorders of bladder: Secondary | ICD-10-CM | POA: Diagnosis not present

## 2024-03-16 DIAGNOSIS — R197 Diarrhea, unspecified: Secondary | ICD-10-CM | POA: Insufficient documentation

## 2024-03-16 DIAGNOSIS — Z510 Encounter for antineoplastic radiation therapy: Secondary | ICD-10-CM | POA: Diagnosis not present

## 2024-03-16 DIAGNOSIS — Z79899 Other long term (current) drug therapy: Secondary | ICD-10-CM | POA: Diagnosis not present

## 2024-03-16 DIAGNOSIS — Z5111 Encounter for antineoplastic chemotherapy: Secondary | ICD-10-CM | POA: Diagnosis not present

## 2024-03-16 LAB — RAD ONC ARIA SESSION SUMMARY
Course Elapsed Days: 42
Plan Fractions Treated to Date: 19
Plan Prescribed Dose Per Fraction: 1.8 Gy
Plan Total Fractions Prescribed: 25
Plan Total Prescribed Dose: 45 Gy
Reference Point Dosage Given to Date: 34.2 Gy
Reference Point Session Dosage Given: 1.8 Gy
Session Number: 30

## 2024-03-16 LAB — CBC WITH DIFFERENTIAL (CANCER CENTER ONLY)
Abs Immature Granulocytes: 0.02 K/uL (ref 0.00–0.07)
Basophils Absolute: 0 K/uL (ref 0.0–0.1)
Basophils Relative: 1 %
Eosinophils Absolute: 0.2 K/uL (ref 0.0–0.5)
Eosinophils Relative: 6 %
HCT: 34.6 % — ABNORMAL LOW (ref 39.0–52.0)
Hemoglobin: 11.7 g/dL — ABNORMAL LOW (ref 13.0–17.0)
Immature Granulocytes: 1 %
Lymphocytes Relative: 14 %
Lymphs Abs: 0.5 K/uL — ABNORMAL LOW (ref 0.7–4.0)
MCH: 31.2 pg (ref 26.0–34.0)
MCHC: 33.8 g/dL (ref 30.0–36.0)
MCV: 92.3 fL (ref 80.0–100.0)
Monocytes Absolute: 0.4 K/uL (ref 0.1–1.0)
Monocytes Relative: 13 %
Neutro Abs: 2.2 K/uL (ref 1.7–7.7)
Neutrophils Relative %: 65 %
Platelet Count: 136 K/uL — ABNORMAL LOW (ref 150–400)
RBC: 3.75 MIL/uL — ABNORMAL LOW (ref 4.22–5.81)
RDW: 15.5 % (ref 11.5–15.5)
WBC Count: 3.4 K/uL — ABNORMAL LOW (ref 4.0–10.5)
nRBC: 0 % (ref 0.0–0.2)

## 2024-03-16 LAB — CMP (CANCER CENTER ONLY)
ALT: 7 U/L (ref 0–44)
AST: 14 U/L — ABNORMAL LOW (ref 15–41)
Albumin: 3.9 g/dL (ref 3.5–5.0)
Alkaline Phosphatase: 61 U/L (ref 38–126)
Anion gap: 5 (ref 5–15)
BUN: 20 mg/dL (ref 8–23)
CO2: 27 mmol/L (ref 22–32)
Calcium: 9.1 mg/dL (ref 8.9–10.3)
Chloride: 103 mmol/L (ref 98–111)
Creatinine: 1.48 mg/dL — ABNORMAL HIGH (ref 0.61–1.24)
GFR, Estimated: 46 mL/min — ABNORMAL LOW (ref >60)
Glucose, Bld: 110 mg/dL — ABNORMAL HIGH (ref 70–99)
Potassium: 4.1 mmol/L (ref 3.5–5.1)
Sodium: 135 mmol/L (ref 135–145)
Total Bilirubin: 0.4 mg/dL (ref 0.0–1.2)
Total Protein: 7.1 g/dL (ref 6.5–8.1)

## 2024-03-16 MED ORDER — PROCHLORPERAZINE MALEATE 10 MG PO TABS
10.0000 mg | ORAL_TABLET | Freq: Once | ORAL | Status: AC
  Administered 2024-03-16: 10 mg via ORAL
  Filled 2024-03-16: qty 1

## 2024-03-16 MED ORDER — SODIUM CHLORIDE 0.9 % IV SOLN
27.0000 mg/m2 | Freq: Once | INTRAVENOUS | Status: AC
  Administered 2024-03-16: 46 mg via INTRAVENOUS
  Filled 2024-03-16: qty 1.21

## 2024-03-16 MED ORDER — SODIUM CHLORIDE 0.9 % IV SOLN: Freq: Once | INTRAVENOUS | Status: DC

## 2024-03-16 MED ORDER — SODIUM CHLORIDE 0.9 % IV SOLN: Freq: Once | INTRAVENOUS | Status: AC

## 2024-03-16 NOTE — Assessment & Plan Note (Addendum)
 Imodium as needed.

## 2024-03-16 NOTE — Addendum Note (Signed)
 Addended by: TINA HUDSON on: 03/16/2024 10:52 AM   Modules accepted: Orders

## 2024-03-16 NOTE — Assessment & Plan Note (Addendum)
 500 mL IV fluid today Advised patient to increase fluid at home.

## 2024-03-16 NOTE — Assessment & Plan Note (Addendum)
 Will continue treatment this week Will need repeat lab next week before last treatment Monitor for precautions of bleeding, fever or infection

## 2024-03-16 NOTE — Patient Instructions (Signed)
 [facility]  Discharge Instructions: Thank you for choosing [facility] to provide your oncology and hematology care.   If you have a lab appointment with the Cancer Center, please go directly to the Cancer Center and check in at the registration area.   Wear comfortable clothing and clothing appropriate for easy access to any Portacath or PICC line.   We strive to give you quality time with your provider. You may need to reschedule your appointment if you arrive late (15 or more minutes).  Arriving late affects you and other patients whose appointments are after yours.  Also, if you miss three or more appointments without notifying the office, you may be dismissed from the clinic at the provider's discretion.      For prescription refill requests, have your pharmacy contact our office and allow 72 hours for refills to be completed.    Today you received the following chemotherapy and/or immunotherapy agents: Gemcitabine (Gemzar)      To help prevent nausea and vomiting after your treatment, we encourage you to take your nausea medication as directed.  BELOW ARE SYMPTOMS THAT SHOULD BE REPORTED IMMEDIATELY: *FEVER GREATER THAN 100.4 F (38 C) OR HIGHER *CHILLS OR SWEATING *NAUSEA AND VOMITING THAT IS NOT CONTROLLED WITH YOUR NAUSEA MEDICATION *UNUSUAL SHORTNESS OF BREATH *UNUSUAL BRUISING OR BLEEDING *URINARY PROBLEMS (pain or burning when urinating, or frequent urination) *BOWEL PROBLEMS (unusual diarrhea, constipation, pain near the anus) TENDERNESS IN MOUTH AND THROAT WITH OR WITHOUT PRESENCE OF ULCERS (sore throat, sores in mouth, or a toothache) UNUSUAL RASH, SWELLING OR PAIN  UNUSUAL VAGINAL DISCHARGE OR ITCHING   Items with * indicate a potential emergency and should be followed up as soon as possible or go to the Emergency Department if any problems should occur.  Please show the CHEMOTHERAPY ALERT CARD or  IMMUNOTHERAPY ALERT CARD at check-in to the Emergency Department and triage nurse.  Should you have questions after your visit or need to cancel or reschedule your appointment, please contact [facility]  Dept: [phone]  and follow the prompts.  Office hours are 8:00 a.m. to 4:30 p.m. Monday - Friday. Please note that voicemails left after 4:00 p.m. may not be returned until the following business day.  We are closed weekends and major holidays. You have access to a nurse at all times for urgent questions. Please call the main number to the clinic Dept: [phone] and follow the prompts.   For any non-urgent questions, you may also contact your provider using MyChart. We now offer e-Visits for anyone 93 and older to request care online for non-urgent symptoms. For details visit [email or web address].   Also download the MyChart app! Go to the app store, search "MyChart", open the app, select [place], and log in with your MyChart username and password.

## 2024-03-17 ENCOUNTER — Ambulatory Visit
Admission: RE | Admit: 2024-03-17 | Discharge: 2024-03-17 | Disposition: A | Discharge status: Home / Self Care | Source: Ambulatory Visit | Attending: Radiation Oncology | Admitting: Radiation Oncology

## 2024-03-17 ENCOUNTER — Other Ambulatory Visit: Payer: Self-pay

## 2024-03-17 DIAGNOSIS — N3000 Acute cystitis without hematuria: Secondary | ICD-10-CM | POA: Diagnosis not present

## 2024-03-17 DIAGNOSIS — C679 Malignant neoplasm of bladder, unspecified: Secondary | ICD-10-CM | POA: Insufficient documentation

## 2024-03-17 DIAGNOSIS — Z510 Encounter for antineoplastic radiation therapy: Secondary | ICD-10-CM | POA: Insufficient documentation

## 2024-03-17 DIAGNOSIS — Z79899 Other long term (current) drug therapy: Secondary | ICD-10-CM | POA: Diagnosis not present

## 2024-03-17 DIAGNOSIS — Z5111 Encounter for antineoplastic chemotherapy: Secondary | ICD-10-CM | POA: Diagnosis not present

## 2024-03-17 LAB — RAD ONC ARIA SESSION SUMMARY
Course Elapsed Days: 43
Plan Fractions Treated to Date: 20
Plan Prescribed Dose Per Fraction: 1.8 Gy
Plan Total Fractions Prescribed: 25
Plan Total Prescribed Dose: 45 Gy
Reference Point Dosage Given to Date: 36 Gy
Reference Point Session Dosage Given: 1.8 Gy
Session Number: 31

## 2024-03-18 ENCOUNTER — Other Ambulatory Visit: Payer: Self-pay

## 2024-03-18 ENCOUNTER — Ambulatory Visit
Admission: RE | Admit: 2024-03-18 | Discharge: 2024-03-18 | Disposition: A | Discharge status: Home / Self Care | Source: Ambulatory Visit | Attending: Radiation Oncology | Admitting: Radiation Oncology

## 2024-03-18 DIAGNOSIS — C679 Malignant neoplasm of bladder, unspecified: Secondary | ICD-10-CM | POA: Diagnosis not present

## 2024-03-18 DIAGNOSIS — N3000 Acute cystitis without hematuria: Secondary | ICD-10-CM | POA: Diagnosis not present

## 2024-03-18 DIAGNOSIS — Z5111 Encounter for antineoplastic chemotherapy: Secondary | ICD-10-CM | POA: Diagnosis not present

## 2024-03-18 DIAGNOSIS — Z510 Encounter for antineoplastic radiation therapy: Secondary | ICD-10-CM | POA: Diagnosis not present

## 2024-03-18 DIAGNOSIS — Z79899 Other long term (current) drug therapy: Secondary | ICD-10-CM | POA: Diagnosis not present

## 2024-03-18 LAB — RAD ONC ARIA SESSION SUMMARY
Course Elapsed Days: 44
Plan Fractions Treated to Date: 21
Plan Prescribed Dose Per Fraction: 1.8 Gy
Plan Total Fractions Prescribed: 25
Plan Total Prescribed Dose: 45 Gy
Reference Point Dosage Given to Date: 37.8 Gy
Reference Point Session Dosage Given: 1.8 Gy
Session Number: 32

## 2024-03-19 ENCOUNTER — Ambulatory Visit
Admission: RE | Admit: 2024-03-19 | Discharge: 2024-03-19 | Disposition: A | Discharge status: Home / Self Care | Source: Ambulatory Visit | Attending: Radiation Oncology | Admitting: Radiation Oncology

## 2024-03-19 ENCOUNTER — Other Ambulatory Visit: Payer: Self-pay

## 2024-03-19 DIAGNOSIS — Z5111 Encounter for antineoplastic chemotherapy: Secondary | ICD-10-CM | POA: Diagnosis not present

## 2024-03-19 DIAGNOSIS — Z79899 Other long term (current) drug therapy: Secondary | ICD-10-CM | POA: Diagnosis not present

## 2024-03-19 DIAGNOSIS — Z510 Encounter for antineoplastic radiation therapy: Secondary | ICD-10-CM | POA: Diagnosis not present

## 2024-03-19 DIAGNOSIS — N3000 Acute cystitis without hematuria: Secondary | ICD-10-CM | POA: Diagnosis not present

## 2024-03-19 DIAGNOSIS — C679 Malignant neoplasm of bladder, unspecified: Secondary | ICD-10-CM | POA: Diagnosis not present

## 2024-03-19 LAB — RAD ONC ARIA SESSION SUMMARY
Course Elapsed Days: 45
Plan Fractions Treated to Date: 22
Plan Prescribed Dose Per Fraction: 1.8 Gy
Plan Total Fractions Prescribed: 25
Plan Total Prescribed Dose: 45 Gy
Reference Point Dosage Given to Date: 39.6 Gy
Reference Point Session Dosage Given: 1.8 Gy
Session Number: 33

## 2024-03-22 ENCOUNTER — Ambulatory Visit
Admission: RE | Admit: 2024-03-22 | Discharge: 2024-03-22 | Disposition: A | Discharge status: Home / Self Care | Source: Ambulatory Visit | Attending: Radiation Oncology | Admitting: Radiation Oncology

## 2024-03-22 ENCOUNTER — Other Ambulatory Visit: Payer: Self-pay

## 2024-03-22 DIAGNOSIS — C679 Malignant neoplasm of bladder, unspecified: Secondary | ICD-10-CM | POA: Diagnosis not present

## 2024-03-22 DIAGNOSIS — N3000 Acute cystitis without hematuria: Secondary | ICD-10-CM | POA: Diagnosis not present

## 2024-03-22 DIAGNOSIS — Z79899 Other long term (current) drug therapy: Secondary | ICD-10-CM | POA: Diagnosis not present

## 2024-03-22 DIAGNOSIS — Z510 Encounter for antineoplastic radiation therapy: Secondary | ICD-10-CM | POA: Diagnosis not present

## 2024-03-22 DIAGNOSIS — Z5111 Encounter for antineoplastic chemotherapy: Secondary | ICD-10-CM | POA: Diagnosis not present

## 2024-03-22 LAB — RAD ONC ARIA SESSION SUMMARY
Course Elapsed Days: 48
Plan Fractions Treated to Date: 23
Plan Prescribed Dose Per Fraction: 1.8 Gy
Plan Total Fractions Prescribed: 25
Plan Total Prescribed Dose: 45 Gy
Reference Point Dosage Given to Date: 41.4 Gy
Reference Point Session Dosage Given: 1.8 Gy
Session Number: 34

## 2024-03-23 ENCOUNTER — Other Ambulatory Visit: Payer: Self-pay

## 2024-03-23 ENCOUNTER — Inpatient Hospital Stay

## 2024-03-23 ENCOUNTER — Ambulatory Visit
Admission: RE | Admit: 2024-03-23 | Discharge: 2024-03-23 | Disposition: A | Source: Ambulatory Visit | Attending: Radiation Oncology

## 2024-03-23 VITALS — BP 122/74 | HR 77 | Temp 97.6°F | Resp 17 | Wt 143.2 lb

## 2024-03-23 DIAGNOSIS — C679 Malignant neoplasm of bladder, unspecified: Secondary | ICD-10-CM

## 2024-03-23 DIAGNOSIS — Z5111 Encounter for antineoplastic chemotherapy: Secondary | ICD-10-CM | POA: Insufficient documentation

## 2024-03-23 DIAGNOSIS — E860 Dehydration: Secondary | ICD-10-CM

## 2024-03-23 DIAGNOSIS — K5909 Other constipation: Secondary | ICD-10-CM | POA: Diagnosis not present

## 2024-03-23 DIAGNOSIS — K5900 Constipation, unspecified: Secondary | ICD-10-CM | POA: Insufficient documentation

## 2024-03-23 DIAGNOSIS — N3000 Acute cystitis without hematuria: Secondary | ICD-10-CM | POA: Insufficient documentation

## 2024-03-23 DIAGNOSIS — Z79899 Other long term (current) drug therapy: Secondary | ICD-10-CM | POA: Insufficient documentation

## 2024-03-23 DIAGNOSIS — Z510 Encounter for antineoplastic radiation therapy: Secondary | ICD-10-CM | POA: Diagnosis not present

## 2024-03-23 LAB — RAD ONC ARIA SESSION SUMMARY
Course Elapsed Days: 49
Plan Fractions Treated to Date: 24
Plan Prescribed Dose Per Fraction: 1.8 Gy
Plan Total Fractions Prescribed: 25
Plan Total Prescribed Dose: 45 Gy
Reference Point Dosage Given to Date: 43.2 Gy
Reference Point Session Dosage Given: 1.8 Gy
Session Number: 35

## 2024-03-23 LAB — CBC WITH DIFFERENTIAL (CANCER CENTER ONLY)
Abs Immature Granulocytes: 0.01 K/uL (ref 0.00–0.07)
Basophils Absolute: 0 K/uL (ref 0.0–0.1)
Basophils Relative: 1 %
Eosinophils Absolute: 0.1 K/uL (ref 0.0–0.5)
Eosinophils Relative: 5 %
HCT: 32.4 % — ABNORMAL LOW (ref 39.0–52.0)
Hemoglobin: 11 g/dL — ABNORMAL LOW (ref 13.0–17.0)
Immature Granulocytes: 0 %
Lymphocytes Relative: 13 %
Lymphs Abs: 0.3 K/uL — ABNORMAL LOW (ref 0.7–4.0)
MCH: 31.4 pg (ref 26.0–34.0)
MCHC: 34 g/dL (ref 30.0–36.0)
MCV: 92.6 fL (ref 80.0–100.0)
Monocytes Absolute: 0.4 K/uL (ref 0.1–1.0)
Monocytes Relative: 15 %
Neutro Abs: 1.7 K/uL (ref 1.7–7.7)
Neutrophils Relative %: 66 %
Platelet Count: 199 K/uL (ref 150–400)
RBC: 3.5 MIL/uL — ABNORMAL LOW (ref 4.22–5.81)
RDW: 16.6 % — ABNORMAL HIGH (ref 11.5–15.5)
WBC Count: 2.6 K/uL — ABNORMAL LOW (ref 4.0–10.5)
nRBC: 0 % (ref 0.0–0.2)

## 2024-03-23 LAB — CMP (CANCER CENTER ONLY)
ALT: 8 U/L (ref 0–44)
AST: 14 U/L — ABNORMAL LOW (ref 15–41)
Albumin: 3.9 g/dL (ref 3.5–5.0)
Alkaline Phosphatase: 55 U/L (ref 38–126)
Anion gap: 4 — ABNORMAL LOW (ref 5–15)
BUN: 18 mg/dL (ref 8–23)
CO2: 28 mmol/L (ref 22–32)
Calcium: 9.1 mg/dL (ref 8.9–10.3)
Chloride: 108 mmol/L (ref 98–111)
Creatinine: 1.23 mg/dL (ref 0.61–1.24)
GFR, Estimated: 58 mL/min — ABNORMAL LOW (ref >60)
Glucose, Bld: 104 mg/dL — ABNORMAL HIGH (ref 70–99)
Potassium: 3.7 mmol/L (ref 3.5–5.1)
Sodium: 140 mmol/L (ref 135–145)
Total Bilirubin: 0.4 mg/dL (ref 0.0–1.2)
Total Protein: 7.1 g/dL (ref 6.5–8.1)

## 2024-03-23 MED ORDER — PROCHLORPERAZINE MALEATE 10 MG PO TABS
10.0000 mg | ORAL_TABLET | Freq: Once | ORAL | Status: AC
  Administered 2024-03-23: 10 mg via ORAL
  Filled 2024-03-23: qty 1

## 2024-03-23 MED ORDER — SODIUM CHLORIDE 0.9 % IV SOLN: INTRAVENOUS | Status: DC

## 2024-03-23 MED ORDER — SODIUM CHLORIDE 0.9 % IV SOLN
27.0000 mg/m2 | Freq: Once | INTRAVENOUS | Status: AC
  Administered 2024-03-23: 46 mg via INTRAVENOUS
  Filled 2024-03-23: qty 1.21

## 2024-03-23 MED ORDER — SODIUM CHLORIDE 0.9 % IV SOLN: INTRAVENOUS | Status: AC

## 2024-03-23 NOTE — Assessment & Plan Note (Addendum)
 8/19 C1D1 CRT Omitted week 3 & 6 of chemotherapy due to cytopenia Decreased to weekly around week 7 8 due to persistent cytopenia Proceed with last chemotherapy.   Patient will finish radiation next week. Return in a week for follow-up with IV fluid.

## 2024-03-23 NOTE — Progress Notes (Signed)
 [facility] OFFICE PROGRESS NOTE  Patient Care Team: Benjamine Aland, MD as PCP - General (Family Medicine) Kristie Lamprey, MD as Consulting Physician (Gastroenterology)  Kenneth Sanford is a 83 y.o.male with history of BPH with TURP, CKD3, arthritis, hypothyroidism [facility].   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 The carcinoma involving adjacent prostatic ducts and stroma. CT reports negative for metastatic disease. Clinical cT4aN0M0 stage IIIA.  After discussion he elected on CRT.   Decreased oral intake, more tiredness.  Constipation.  Not drinking fluid as much.  Discussed expected side effects towards the end of treatment.  Will add supportive measures with IV fluids over the next 2 weeks.  Increase oral intake as able.  IV fluid order placed. Assessment & Plan Urothelial carcinoma of bladder (HCC) 8/19 C1D1 CRT Omitted week 3 & 6 of chemotherapy due to cytopenia Decreased to weekly around week 7 8 due to persistent cytopenia Proceed with last chemotherapy.   Patient will finish radiation next week. Return in a week for follow-up with IV fluid. Dehydration IV fluid 500 mL today, 1 more day 1000 mL later this week IV fluid twice next week Normal saline ordered on the supportive plan. Follow-up with me next week for reassessment. Other constipation Increase fluid intake.  Stool softener twice daily as needed until having regular bowel movement    Kenneth JAYSON Chihuahua, MD  INTERVAL HISTORY: Patient returns for follow-up.  Daughter is with him today.  Report of decreased oral intake including fluid.  No fever or chills.  He has constipation for about 3 days now.  Does report not eating as much.  No abdominal pain.  Report of bladder spasms helpful with Tylenol .  Oncology History  Urothelial carcinoma of bladder (HCC)  09/14/2021 Pathology Results   Prostate, chips BENIGN NODULAR  GLANDULAR AND STROMAL HYPERPLASIA MILD CHRONIC PROSTATITIS NEGATIVE FOR CARCINOMA   09/15/2023 Cancer Staging   Staging form: Urinary Bladder, AJCC 8th Edition - Clinical stage from 09/15/2023: Stage IIIA (cT4a, cN0, cM0) - Signed by Sherwood Rise, PA-C on 01/15/2024 Stage prefix: Initial diagnosis WHO/ISUP grade (low/high): High Grade Histologic grading system: 2 grade system   10/13/2023 Imaging   CT AP w/wo  At least 2 small enhancing mural nodules along the right lateral bladder wall suspicious for bladder carcinoma..  Markedly enlarged prostate gland which indents the bladder base. Asymmetric soft tissue density again seen protruding from the right lateral prostate base and right seminal vesicle. Prostate carcinoma with invasino of right seminal vesicle cannot be excluded.  No evidence of metastatic disease.  No evidenced of upper urinary tract neoplasm, urolithiasis or hydronephrosis.    10/31/2023 Pathology Results   A. BLADDER, TUMOR, TURBT:  Infiltrating high grade papillary urothelial carcinoma with  micropapillary (40%) component  The carcinoma invades muscularis propria (detrusor muscle)   B. PROSTATE CHIPS, TURP:  High grade urothelial carcinoma  The carcinoma involving adjacent prostatic ducts and stroma    11/18/2023 Initial Diagnosis   Urothelial carcinoma of bladder (HCC)   02/03/2024 -  Chemotherapy   Patient is on Treatment Plan : BLADDER Gemcitabine  Twice Weekly + XRT       PHYSICAL EXAMINATION: ECOG PERFORMANCE STATUS: 2 - Symptomatic, <50% confined to bed Vital signs stable  GENERAL: alert, no distress and comfortable LUNGS: clear to auscultation and no wheeze or rales with normal breathing effort HEART: regular rate & rhythm  ABDOMEN: abdomen soft, non-tender and nondistended. Musculoskeletal: no  edema  Relevant data reviewed during this visit included labs.

## 2024-03-23 NOTE — Assessment & Plan Note (Addendum)
 Increase fluid intake.  Stool softener twice daily as needed until having regular bowel movement

## 2024-03-23 NOTE — Patient Instructions (Signed)

## 2024-03-23 NOTE — Assessment & Plan Note (Addendum)
 IV fluid 500 mL today, 1 more day 1000 mL later this week IV fluid twice next week Normal saline ordered on the supportive plan. Follow-up with me next week for reassessment.

## 2024-03-24 ENCOUNTER — Ambulatory Visit
Admission: RE | Admit: 2024-03-24 | Discharge: 2024-03-24 | Disposition: A | Discharge status: Home / Self Care | Source: Ambulatory Visit | Attending: Radiation Oncology | Admitting: Radiation Oncology

## 2024-03-24 ENCOUNTER — Other Ambulatory Visit: Payer: Self-pay

## 2024-03-24 DIAGNOSIS — Z5111 Encounter for antineoplastic chemotherapy: Secondary | ICD-10-CM | POA: Diagnosis not present

## 2024-03-24 DIAGNOSIS — Z510 Encounter for antineoplastic radiation therapy: Secondary | ICD-10-CM | POA: Diagnosis not present

## 2024-03-24 DIAGNOSIS — C679 Malignant neoplasm of bladder, unspecified: Secondary | ICD-10-CM | POA: Diagnosis not present

## 2024-03-24 DIAGNOSIS — Z79899 Other long term (current) drug therapy: Secondary | ICD-10-CM | POA: Diagnosis not present

## 2024-03-24 DIAGNOSIS — N3000 Acute cystitis without hematuria: Secondary | ICD-10-CM | POA: Diagnosis not present

## 2024-03-24 LAB — RAD ONC ARIA SESSION SUMMARY
Course Elapsed Days: 50
Plan Fractions Treated to Date: 25
Plan Prescribed Dose Per Fraction: 1.8 Gy
Plan Total Fractions Prescribed: 25
Plan Total Prescribed Dose: 45 Gy
Reference Point Dosage Given to Date: 45 Gy
Reference Point Session Dosage Given: 1.8 Gy
Session Number: 36

## 2024-03-25 ENCOUNTER — Telehealth: Payer: Self-pay

## 2024-03-25 ENCOUNTER — Other Ambulatory Visit: Payer: Self-pay

## 2024-03-25 NOTE — Radiation Completion Notes (Addendum)
  Radiation Oncology         [phone] ________________________________  Name: Kenneth Sanford MRN: [id number]  Date: 03/24/2024  DOB: 04/14/1941  Referring Physician: PAULETTA CHIHUAHUA, M.D. Date of Service: 2024-03-25 Radiation Oncologist: Adina Barge, M.D. [facility]     RADIATION ONCOLOGY END OF TREATMENT NOTE     Diagnosis: 83 y/o man with cT4a N0 M0 muscle invasive high grade papillary urothelial carcinoma of the lateral bladder wall involving the adjacent prostatic stroma and ducts.   Intent: Curative     ==========DELIVERED PLANS==========  First Treatment Date: 2024-02-03 Last Treatment Date: 2024-03-24   Plan Name: Bladder_Bst Site: Bladder Technique: 3D Mode: Photon Dose Per Fraction: 1.8 Gy Prescribed Dose (Delivered / Prescribed): 19.8 Gy / 19.8 Gy Prescribed Fxs (Delivered / Prescribed): 11 / 11   Plan Name: Bladder_Pelv Site: Bladder Technique: 3D Mode: Photon Dose Per Fraction: 1.8 Gy Prescribed Dose (Delivered / Prescribed): 45 Gy / 45 Gy Prescribed Fxs (Delivered / Prescribed): 25 / 25     ==========ON TREATMENT VISIT DATES========== 2024-02-06, 2024-02-13, 2024-02-20, 2024-02-26, 2024-03-05, 2024-03-11, 2024-03-19, 2024-03-24   See weekly On Treatment Notes in Epic for details in the Media tab (listed as Progress notes on the On Treatment Visit Dates listed above).  He tolerated the daily treatments relatively well with only modest fatigue and mild increased LUTS.  The patient will receive a call in about one month from the radiation oncology department. He will continue follow up with his medical oncologist,Dr. CHIHUAHUA and his urologist, Dr. Carolee, as well.  ------------------------------------------------   Donnice Barge, MD [facility]  Radiation Oncology Direct Dial: [phone]  Fax: [phone] [email or web address]  Skype  LinkedIn

## 2024-03-25 NOTE — Telephone Encounter (Signed)
 Spoke with the patient he has questions about his appointment tomorrow. Explained that this is for fluids only no chemotherapy. He should feel better after each treatment. Verbally given schedule patient was able to repeat back what he had written down. No other issues. Arland Legions BSN RN

## 2024-03-26 ENCOUNTER — Inpatient Hospital Stay

## 2024-03-26 VITALS — BP 117/85 | HR 73 | Temp 98.0°F | Resp 18

## 2024-03-26 DIAGNOSIS — C679 Malignant neoplasm of bladder, unspecified: Secondary | ICD-10-CM

## 2024-03-26 DIAGNOSIS — N3000 Acute cystitis without hematuria: Secondary | ICD-10-CM | POA: Diagnosis not present

## 2024-03-26 DIAGNOSIS — Z5111 Encounter for antineoplastic chemotherapy: Secondary | ICD-10-CM | POA: Diagnosis not present

## 2024-03-26 DIAGNOSIS — Z79899 Other long term (current) drug therapy: Secondary | ICD-10-CM | POA: Diagnosis not present

## 2024-03-26 DIAGNOSIS — Z510 Encounter for antineoplastic radiation therapy: Secondary | ICD-10-CM | POA: Diagnosis not present

## 2024-03-26 MED ORDER — SODIUM CHLORIDE 0.9 % IV SOLN: Freq: Once | INTRAVENOUS | Status: AC

## 2024-03-26 NOTE — Patient Instructions (Signed)
 Fluids Given Through an IV (IV Infusion Therapy): What to Expect IV infusion therapy is a treatment to deliver a fluid, called an infusion, into a vein. You may have IV infusion to get: Fluids. Medicines. Nutrition. Chemotherapy. This is medicines to stop or slow down cancer cells. Blood or blood products. Dye that is given before an MRI or a CT scan. This is called contrast dye. Tell a health care provider about: Any allergies you have. This includes allergies to anesthesia or dyes. All medicines you take. These include vitamins, herbs, eye drops, and creams. Any bleeding problems you have. Any surgeries you've had, including if you've had lymph nodes taken out of your armpit or if you have a arteriovenous fistula for dialysis. Any medical problems you have. Whether you're pregnant or may be pregnant. Whether you've used IV drugs. What are the risks? Your health care provider will talk with you about risks. These may include: Pain, bruising, or bleeding. Infection. The IV leaking or moving out of place. Damage to blood vessels or nerves. Allergic reactions to medicines or dyes. A blood clot. An air bubble in the vein, also called an air embolism. What happens before the procedure? Eat and drink only as you've been told. Ask about changing or stopping: Any medicines you take. Any vitamins, herbs, or supplements you take. What happens during the procedure?     Placing the catheter Your skin at the IV site will be washed with fluid that kills germs. This will help prevent infection. IV infusion therapy starts with a procedure to place a soft tube called a catheter into a vein. An IV tube will be attached to the catheter to let the infusion flow into your blood. Your catheter may be placed: Into a vein that is usually in the bend of the elbow, forearm, or back of the hand. This is called a peripheral IV catheter. This may need to be put into a vein each time you get an  infusion. Into a vein near your elbow. This is called a midline catheter or a peripherally inserted central catheter (PICC). These types of catheters may stay in place for weeks or months at a time so you can receive repeated infusions through it. Into a vein near your neck that leads to your heart. This is called a non-tunneled catheter. This is only used for short amounts of time because it can cause infection. Through the skin of your chest and into a large vein that leads to your heart. This is called a tunneled catheter. This may stay in your body for months or years. Into an implanted port. An implanted port is a device that is surgically inserted under the skin of the chest to provide long-term IV access. The catheter will connect the port to a large vein in the chest or upper arm. A port may be kept in place for many months or years. Each time you have an infusion, a needle will be inserted through your skin to connect the catheter to the port. Doing the infusion To start the infusion, your provider will: Attach the IV tubing to your catheter. Use a tape or a bandage to hold the IV in place against your skin. An IV pump may be used to control the flow of the IV infusion. During the infusion, your provider will check the area to make sure: There is no bleeding, swelling, or pain. Your IV infusion is flowing correctly. After the infusion, your provider will: Take off the bandage  or tape. Disconnect the tubing from the catheter. Remove the catheter, if you have a peripheral IV. Apply pressure over the IV insertion site to stop bleeding, then cover the area with a bandage. If you have an implanted port, PICC, non-tunneled, or tunneled catheter, your catheter may remain in place. This depends on how many times you will need treatment, your medical condition, and what type of catheter you have. These steps may vary. Ask what you can expect. What can I expect after the procedure? You may be  watched closely until you leave. This includes checking your pain level, blood pressure, heart rate, and breathing rate. Your provider will check to make sure there are no signs of infection. Follow these instructions at home: Take your medicines only as told. Change or take off your bandage as told by your provider. Ask what things are safe for you to do at home. Ask when you can go back to work or school. Do not take baths, swim, or use a hot tub until you're told it's OK. Ask if you can shower. Check your IV insertion site every day for signs of infection. Check for: Redness, swelling, or pain. Fluid or blood. If fluid or blood drains from your IV site, use your hands to press down firmly on the area for a minute or two. Doing this should stop the bleeding. Warmth. Pus or a bad smell. Contact a health care provider if: You have signs of infection around your IV site. You have fluid or blood coming from your IV site that does not stop after you put pressure to the site. You have a rash or blisters. You have itchy, red, swollen areas of skin called hives. Get help right away if: You have a fever or chills. You have chest pain. You have trouble breathing. This information is not intended to replace advice given to you by your health care provider. Make sure you discuss any questions you have with your health care provider. Document Revised: 11/26/2022 Document Reviewed: 11/26/2022 Elsevier Patient Education  2024 [employer].

## 2024-03-30 ENCOUNTER — Inpatient Hospital Stay

## 2024-03-30 VITALS — BP 114/91 | HR 89 | Temp 97.8°F | Resp 17

## 2024-03-30 DIAGNOSIS — Z510 Encounter for antineoplastic radiation therapy: Secondary | ICD-10-CM | POA: Diagnosis not present

## 2024-03-30 DIAGNOSIS — R197 Diarrhea, unspecified: Secondary | ICD-10-CM | POA: Diagnosis not present

## 2024-03-30 DIAGNOSIS — N3000 Acute cystitis without hematuria: Secondary | ICD-10-CM | POA: Diagnosis not present

## 2024-03-30 DIAGNOSIS — C679 Malignant neoplasm of bladder, unspecified: Secondary | ICD-10-CM | POA: Diagnosis not present

## 2024-03-30 DIAGNOSIS — D6481 Anemia due to antineoplastic chemotherapy: Secondary | ICD-10-CM

## 2024-03-30 DIAGNOSIS — Z5111 Encounter for antineoplastic chemotherapy: Secondary | ICD-10-CM | POA: Diagnosis not present

## 2024-03-30 DIAGNOSIS — T451X5A Adverse effect of antineoplastic and immunosuppressive drugs, initial encounter: Secondary | ICD-10-CM

## 2024-03-30 DIAGNOSIS — Z79899 Other long term (current) drug therapy: Secondary | ICD-10-CM | POA: Diagnosis not present

## 2024-03-30 MED ORDER — SODIUM CHLORIDE 0.9 % IV SOLN: Freq: Once | INTRAVENOUS | Status: AC

## 2024-03-30 NOTE — Progress Notes (Signed)
 [facility] OFFICE PROGRESS NOTE  Patient Care Team: Benjamine Aland, MD as PCP - General (Family Medicine) Kristie Lamprey, MD as Consulting Physician (Gastroenterology)  Kenneth Sanford is a 83 y.o.male with history of BPH with TURP, CKD3, arthritis, hypothyroidism [facility].   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 The carcinoma involving adjacent prostatic ducts and stroma. CT reports negative for metastatic disease. Clinical [id number] stage IIIA.  New complication. Report having diarrhea.  Completed radiation 10/8.  Discussed diarrhea may occur posttreatment and should expect improvement over the next few days to week.  In the meantime, increase fluids, Imodium as needed is important.  Patient has not taken any stool softener or MiraLAX from previous constipation. Assessment & Plan Urothelial carcinoma of bladder (HCC) 8/19 C1D1 CRT 10/8 completed radiation Return in a week for follow-up with IV fluid. Anemia due to antineoplastic chemotherapy Expect recovery over the next few weeks. No signs of bleeding. Will repeat labs in about 2 weeks Diarrhea, unspecified type Imodium as needed up to 4 times a day Recommend increase oral fluid intake at home CMP and magnesium in about 2 weeks.  Return on 10/27 with lab before visit.  Orders Placed This Encounter  Procedures   CBC with Differential (Cancer Center Only)    Standing Status:   Future    Expiration Date:   03/30/2025   CMP (Cancer Center only)    Standing Status:   Future    Expiration Date:   03/30/2025   Magnesium    Standing Status:   Future    Expiration Date:   03/30/2025     [name], MD  INTERVAL HISTORY: Patient returns for follow-up. He is getting IVF. Report having diarrhea.  4 times yesterday.  He has stopped taking MiraLAX and stool softener.  Report more diarrhea today, and before coming today.  No stomach  pain or fever.  He took 1 Imodium.  So far he is sitting on the chair, and no additional diarrhea.  Oncology History  Urothelial carcinoma of bladder (HCC)  09/14/2021 Pathology Results   Prostate, chips BENIGN NODULAR GLANDULAR AND STROMAL HYPERPLASIA MILD CHRONIC PROSTATITIS NEGATIVE FOR CARCINOMA   09/15/2023 Cancer Staging   Staging form: Urinary Bladder, AJCC 8th Edition - Clinical stage from 09/15/2023: Stage IIIA (cT4a, cN0, cM0) - Signed by Sherwood Rise, PA-C on 01/15/2024 Stage prefix: Initial diagnosis WHO/ISUP grade (low/high): High Grade Histologic grading system: 2 grade system   10/13/2023 Imaging   CT AP w/wo  At least 2 small enhancing mural nodules along the right lateral bladder wall suspicious for bladder carcinoma..  Markedly enlarged prostate gland which indents the bladder base. Asymmetric soft tissue density again seen protruding from the right lateral prostate base and right seminal vesicle. Prostate carcinoma with invasino of right seminal vesicle cannot be excluded.  No evidence of metastatic disease.  No evidenced of upper urinary tract neoplasm, urolithiasis or hydronephrosis.    10/31/2023 Pathology Results   A. BLADDER, TUMOR, TURBT:  Infiltrating high grade papillary urothelial carcinoma with  micropapillary (40%) component  The carcinoma invades muscularis propria (detrusor muscle)   B. PROSTATE CHIPS, TURP:  High grade urothelial carcinoma  The carcinoma involving adjacent prostatic ducts and stroma    11/18/2023 Initial Diagnosis   Urothelial carcinoma of bladder (HCC)   02/03/2024 -  Chemotherapy   Omitted week 3 & 6 of chemotherapy due to cytopenia Decreased to weekly around week 7  8 due to persistent cytopenia Patient is on Treatment Plan : BLADDER Gemcitabine  Twice Weekly + XRT         PHYSICAL EXAMINATION: ECOG PERFORMANCE STATUS: 1 - Symptomatic but completely ambulatory  VSS  GENERAL: alert, no distress and  comfortable OROPHARYNX: no exudate  HEART: regular rate & rhythm  ABDOMEN: abdomen soft, non-tender and nondistended. Musculoskeletal: no edema  Relevant data reviewed during this visit included labs.  New labs ordered.

## 2024-03-30 NOTE — Assessment & Plan Note (Addendum)
 8/19 C1D1 CRT 10/8 completed radiation Return in a week for follow-up with IV fluid.

## 2024-03-30 NOTE — Assessment & Plan Note (Addendum)
 Expect recovery over the next few weeks. No signs of bleeding. Will repeat labs in about 2 weeks

## 2024-03-30 NOTE — Patient Instructions (Signed)
 [facility] - A DEPT OF [facility]  Discharge Instructions: Thank you for choosing [facility] to provide your oncology and hematology care.   If you have a lab appointment with the Cancer Center, please go directly to the Cancer Center and check in at the registration area.   Wear comfortable clothing and clothing appropriate for easy access to any Portacath or PICC line.   We strive to give you quality time with your provider. You may need to reschedule your appointment if you arrive late (15 or more minutes).  Arriving late affects you and other patients whose appointments are after yours.  Also, if you miss three or more appointments without notifying the office, you may be dismissed from the clinic at the provider's discretion.      For prescription refill requests, have your pharmacy contact our office and allow 72 hours for refills to be completed.    Today you received the following chemotherapy and/or immunotherapy agents: IVF     To help prevent nausea and vomiting after your treatment, we encourage you to take your nausea medication as directed.  BELOW ARE SYMPTOMS THAT SHOULD BE REPORTED IMMEDIATELY: *FEVER GREATER THAN 100.4 F (38 C) OR HIGHER *CHILLS OR SWEATING *NAUSEA AND VOMITING THAT IS NOT CONTROLLED WITH YOUR NAUSEA MEDICATION *UNUSUAL SHORTNESS OF BREATH *UNUSUAL BRUISING OR BLEEDING *URINARY PROBLEMS (pain or burning when urinating, or frequent urination) *BOWEL PROBLEMS (unusual diarrhea, constipation, pain near the anus) TENDERNESS IN MOUTH AND THROAT WITH OR WITHOUT PRESENCE OF ULCERS (sore throat, sores in mouth, or a toothache) UNUSUAL RASH, SWELLING OR PAIN  UNUSUAL VAGINAL DISCHARGE OR ITCHING   Items with * indicate a potential emergency and should be followed up as soon as possible or go to the Emergency Department if any problems should occur.  Please show the CHEMOTHERAPY ALERT CARD or IMMUNOTHERAPY ALERT  CARD at check-in to the Emergency Department and triage nurse.  Should you have questions after your visit or need to cancel or reschedule your appointment, please contact [facility] - A DEPT OF [facility]  Dept: [phone]  and follow the prompts.  Office hours are 8:00 a.m. to 4:30 p.m. Monday - Friday. Please note that voicemails left after 4:00 p.m. may not be returned until the following business day.  We are closed weekends and major holidays. You have access to a nurse at all times for urgent questions. Please call the main number to the clinic Dept: [phone] and follow the prompts.   For any non-urgent questions, you may also contact your provider using MyChart. We now offer e-Visits for anyone 41 and older to request care online for non-urgent symptoms. For details visit [email or web address].   Also download the MyChart app! Go to the app store, search MyChart, open the app, select [facility], and log in with your MyChart username and password.

## 2024-04-02 ENCOUNTER — Inpatient Hospital Stay

## 2024-04-02 VITALS — BP 121/81 | HR 87 | Resp 17

## 2024-04-02 DIAGNOSIS — N3000 Acute cystitis without hematuria: Secondary | ICD-10-CM | POA: Diagnosis not present

## 2024-04-02 DIAGNOSIS — Z5111 Encounter for antineoplastic chemotherapy: Secondary | ICD-10-CM | POA: Diagnosis not present

## 2024-04-02 DIAGNOSIS — Z510 Encounter for antineoplastic radiation therapy: Secondary | ICD-10-CM | POA: Diagnosis not present

## 2024-04-02 DIAGNOSIS — C679 Malignant neoplasm of bladder, unspecified: Secondary | ICD-10-CM

## 2024-04-02 DIAGNOSIS — Z79899 Other long term (current) drug therapy: Secondary | ICD-10-CM | POA: Diagnosis not present

## 2024-04-02 MED ORDER — SODIUM CHLORIDE 0.9 % IV SOLN: Freq: Once | INTRAVENOUS | Status: AC

## 2024-04-02 NOTE — Patient Instructions (Signed)
Rehydration, Adult  Rehydration is the replacement of fluids, salts, and minerals in the body (electrolytes) that are lost during dehydration. Dehydration is when there is not enough water or other fluids in the body. This happens when you lose more fluids than you take in. People who are age 84 or older have a higher risk of dehydration than younger adults. This is because in older age, the body: Is less able to maintain the right amount of water. Does not respond to temperature changes as well. Does not get a sense of thirst as easily or quickly. Other causes include: Not drinking enough fluids. This can occur when you are ill, when you forget to drink, or when you are doing activities that require a lot of energy, especially in hot weather. Conditions that cause loss of water or other fluids. These include diarrhea, vomiting, sweating, or urinating a lot. Other illnesses, such as fever or infection. Certain medicines, such as those that remove excess fluid from the body (diuretics). Symptoms of mild or moderate dehydration may include thirst, dry lips and mouth, and dizziness. Symptoms of severe dehydration may include increased heart rate, confusion, fainting, and not urinating. In severe cases, you may need to get fluids through an IV at the hospital. For mild or moderate cases, you can usually rehydrate at home by drinking certain fluids as told by your health care provider. What are the risks? Rehydration is usually safe. Taking in too much fluid (overhydration) can be a problem but is rare. Overhydration can cause an imbalance of electrolytes in the body, kidney failure, fluid in the lungs, or a decrease in salt (sodium) levels in the body. Supplies needed: You will need an oral rehydration solution (ORS) if your health care provider tells you to use one. This is a drink to treat dehydration. It can be found in pharmacies and retail stores. How to rehydrate Fluids Follow instructions from  your health care provider about what to drink. The kind of fluid and the amount you should drink depend on your condition. In general, you should choose drinks that you prefer. If told by your health care provider, drink an ORS. Make an ORS by following instructions on the package. Start by drinking small amounts, about  cup (120 mL) every 5-10 minutes. Slowly increase how much you drink until you have taken in the amount recommended by your health care provider. Drink enough clear fluids to keep your urine pale yellow. If you were told to drink an ORS, finish it first, then start slowly drinking other clear fluids. Drink fluids such as: Water. This includes sparkling and flavored water. Drinking only water can lead to having too little sodium in your body (hyponatremia). Follow the advice of your health care provider. Water from ice chips you suck on. Fruit juice with water added to it(diluted). Sports drinks. Hot or cold herbal teas. Broth-based soups. Coffee. Milk or milk products. Food Follow instructions from your health care provider about what to eat while you rehydrate. Your health care provider may recommend that you slowly begin eating regular foods in small amounts. Eat foods that contain a healthy balance of electrolytes, such as bananas, oranges, potatoes, tomatoes, and spinach. Avoid foods that are greasy or contain a lot of sugar. In some cases, you may get nutrition through a feeding tube that is passed through your nose and into your stomach (nasogastric tube, or NG tube). This may be done if you have uncontrolled vomiting or diarrhea. Drinks to avoid  Certain drinks may make dehydration worse. While you rehydrate, avoid drinking alcohol. How to tell if you are recovering from dehydration You may be getting better if: You are urinating more often than before you started rehydrating. Your urine is pale yellow. Your energy level improves. You vomit less often. You have  diarrhea less often. Your appetite improves or returns to normal. You feel less dizzy or light-headed. Your skin tone and color start to look more normal. Follow these instructions at home: Take over-the-counter and prescription medicines only as told by your health care provider. Do not take sodium tablets. Doing this can lead to having too much sodium in your body (hypernatremia). Contact a health care provider if: You continue to have symptoms of mild or moderate dehydration, such as: Thirst. Dry lips. Slightly dry mouth. Dizziness. Dark urine or less urine than usual. Muscle cramps. You continue to vomit or have diarrhea. Get help right away if: You have symptoms of dehydration that get worse. You have a fever. You have a severe headache. You have been vomiting and have problems, such as: Your vomiting gets worse. Your vomit includes blood or green matter (bile). You cannot eat or drink without vomiting. You have problems with urination or bowel movements, such as: Diarrhea that gets worse. Blood in your stool (feces). This may cause stool to look black and tarry. Not urinating, or urinating only a small amount of very dark urine, within 6-8 hours. You have trouble breathing. You have symptoms that get worse with treatment. These symptoms may be an emergency. Get help right away. Call 911. Do not wait to see if the symptoms will go away. Do not drive yourself to the hospital. This information is not intended to replace advice given to you by your health care provider. Make sure you discuss any questions you have with your health care provider. Document Revised: 10/17/2021 Document Reviewed: 10/15/2021 Elsevier Patient Education  2024 [employer].

## 2024-04-05 ENCOUNTER — Inpatient Hospital Stay

## 2024-04-05 ENCOUNTER — Telehealth: Payer: Self-pay | Admitting: *Deleted

## 2024-04-05 DIAGNOSIS — C679 Malignant neoplasm of bladder, unspecified: Secondary | ICD-10-CM | POA: Diagnosis not present

## 2024-04-05 DIAGNOSIS — Z79899 Other long term (current) drug therapy: Secondary | ICD-10-CM | POA: Diagnosis not present

## 2024-04-05 DIAGNOSIS — R300 Dysuria: Secondary | ICD-10-CM

## 2024-04-05 DIAGNOSIS — Z5111 Encounter for antineoplastic chemotherapy: Secondary | ICD-10-CM | POA: Diagnosis not present

## 2024-04-05 DIAGNOSIS — Z510 Encounter for antineoplastic radiation therapy: Secondary | ICD-10-CM | POA: Diagnosis not present

## 2024-04-05 DIAGNOSIS — N3000 Acute cystitis without hematuria: Secondary | ICD-10-CM | POA: Diagnosis not present

## 2024-04-05 LAB — URINALYSIS, COMPLETE (UACMP) WITH MICROSCOPIC
Bilirubin Urine: NEGATIVE
Glucose, UA: NEGATIVE mg/dL
Ketones, ur: NEGATIVE mg/dL
Nitrite: NEGATIVE
Protein, ur: 100 mg/dL — AB
Specific Gravity, Urine: 1.02 (ref 1.005–1.030)
WBC, UA: >50 WBC/hpf (ref 0–5)
pH: 5 (ref 5.0–8.0)

## 2024-04-05 NOTE — Telephone Encounter (Signed)
 Patient states he has been urinating every 1 hour to 1 1/2 hours since Saturday night. Has been 10 times between 0200 and 1200. Denies odor or burning, states urine is clear. Received IVF on Friday, eating and drinking well this weekend.

## 2024-04-05 NOTE — Telephone Encounter (Signed)
 Pt will come in at 3:30 for UA and culture.

## 2024-04-06 ENCOUNTER — Telehealth: Payer: Self-pay | Admitting: *Deleted

## 2024-04-06 MED ORDER — SULFAMETHOXAZOLE-TRIMETHOPRIM 800-160 MG PO TABS: ORAL_TABLET | ORAL | 0 refills | Status: DC

## 2024-04-06 NOTE — Telephone Encounter (Signed)
 Instructed to take Bactrim DS twice daily for 5 days if he still has symptoms in the morning. Thanks for getting his UA and culture. If culture comes back not sensitive we will send a different antibiotics thanks.   Pt states he is still urinating every hour.

## 2024-04-07 LAB — URINE CULTURE

## 2024-04-11 NOTE — Progress Notes (Unsigned)
 [facility] OFFICE PROGRESS NOTE  Patient Care Team: Benjamine Aland, MD as PCP - General (Family Medicine) Kristie Lamprey, MD as Consulting Physician (Gastroenterology)  Yosef is a 83 y.o.male with history of BPH with TURP, CKD3, arthritis, hypothyroidism [facility].   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 The carcinoma involving adjacent prostatic ducts and stroma. CT reports negative for metastatic disease. Clinical cT4aN0M0 stage IIIA.   Completed chemotherapy with radiation on 10/8. Discussed surveillance. Follow up imaging in beginning of January.  Diarrhea resolved.  UTI is resolving.  Appetite is coming back.  We discussed moving forward and follow-up plan.  Expect fatigue will likely improve over the next few weeks.  If no new concerning symptoms, plan on restaging imaging after the new year about first week of January.  Follow-up earlier if any new concerns.  Assessment & Plan Urothelial carcinoma of bladder (HCC) Labs showing recovering of blood counts Repeat labs and PET in early January Schedule lateral Mediport Port-A-Cath in place Follow up for flushing in November Obtain labs in January when return for PET Acute cystitis without hematuria 1 more dose of antibiotics. Foul smelling urine resolved  Orders Placed This Encounter  Procedures   NM PET Image Restag (PS) Skull Base To Thigh    Standing Status:   Future    Expected Date:   06/21/2024    Expiration Date:   04/12/2025    If indicated for the ordered procedure, I authorize the administration of a radiopharmaceutical per Radiology protocol:   Yes    Preferred imaging location?:   [facility]   CBC with Differential (Cancer Center Only)    Standing Status:   Future    Expiration Date:   04/12/2025   CMP (Cancer Center only)    Standing Status:   Future    Expiration Date:   04/12/2025     [name],  MD  INTERVAL HISTORY: Patient returns for follow-up.  Daughter is with him today.  Report his appetite is coming back.  Energy is okay.  Report he has some increased frequency.  Urine smell has resolved.  He has 1 more dose of antibiotics.  No fever, abdominal pain or lower back pain.  No coughing or shortness of breath or new symptoms.  Diarrhea has resolved.  Oncology History  Urothelial carcinoma of bladder (HCC)  09/14/2021 Pathology Results   Prostate, chips BENIGN NODULAR GLANDULAR AND STROMAL HYPERPLASIA MILD CHRONIC PROSTATITIS NEGATIVE FOR CARCINOMA   09/15/2023 Cancer Staging   Staging form: Urinary Bladder, AJCC 8th Edition - Clinical stage from 09/15/2023: Stage IIIA (cT4a, cN0, cM0) - Signed by Sherwood Rise, PA-C on 01/15/2024 Stage prefix: Initial diagnosis WHO/ISUP grade (low/high): High Grade Histologic grading system: 2 grade system   10/13/2023 Imaging   CT AP w/wo  At least 2 small enhancing mural nodules along the right lateral bladder wall suspicious for bladder carcinoma..  Markedly enlarged prostate gland which indents the bladder base. Asymmetric soft tissue density again seen protruding from the right lateral prostate base and right seminal vesicle. Prostate carcinoma with invasino of right seminal vesicle cannot be excluded.  No evidence of metastatic disease.  No evidenced of upper urinary tract neoplasm, urolithiasis or hydronephrosis.    10/31/2023 Pathology Results   A. BLADDER, TUMOR, TURBT:  Infiltrating high grade papillary urothelial carcinoma with  micropapillary (40%) component  The carcinoma invades muscularis propria (detrusor muscle)   B. PROSTATE  CHIPS, TURP:  High grade urothelial carcinoma  The carcinoma involving adjacent prostatic ducts and stroma    11/18/2023 Initial Diagnosis   Urothelial carcinoma of bladder (HCC)   02/03/2024 -  Chemotherapy   Omitted week 3 & 6 of chemotherapy due to cytopenia Decreased to weekly around week 7 8  due to persistent cytopenia Patient is on Treatment Plan : BLADDER Gemcitabine  Twice Weekly + XRT         PHYSICAL EXAMINATION: ECOG PERFORMANCE STATUS: 2 - Symptomatic, <50% confined to bed  Vitals:   04/12/24 1225  BP: 118/75  Pulse: 84  Resp: 18  Temp: 98.1 F (36.7 C)  SpO2: 99%   Filed Weights   04/12/24 1225  Weight: 138 lb 9.6 oz (62.9 kg)    GENERAL: alert, no distress and comfortable SKIN: skin color normal and no jaundice or bruising or petechiae on exposed skin EYES: normal, sclera clear OROPHARYNX: no exudate  NECK: No palpable mass LUNGS: clear to auscultation and no wheeze or rales with normal breathing effort HEART: regular rate & rhythm  ABDOMEN: abdomen soft, non-tender and nondistended.   Relevant data reviewed during this visit included labs.  New labs and imaging ordered.

## 2024-04-12 ENCOUNTER — Inpatient Hospital Stay

## 2024-04-12 ENCOUNTER — Other Ambulatory Visit: Payer: Self-pay

## 2024-04-12 VITALS — BP 118/75 | HR 84 | Temp 98.1°F | Resp 18 | Wt 138.6 lb

## 2024-04-12 DIAGNOSIS — T451X5A Adverse effect of antineoplastic and immunosuppressive drugs, initial encounter: Secondary | ICD-10-CM

## 2024-04-12 DIAGNOSIS — N3000 Acute cystitis without hematuria: Secondary | ICD-10-CM | POA: Diagnosis not present

## 2024-04-12 DIAGNOSIS — C679 Malignant neoplasm of bladder, unspecified: Secondary | ICD-10-CM

## 2024-04-12 DIAGNOSIS — R300 Dysuria: Secondary | ICD-10-CM

## 2024-04-12 DIAGNOSIS — Z79899 Other long term (current) drug therapy: Secondary | ICD-10-CM | POA: Diagnosis not present

## 2024-04-12 DIAGNOSIS — Z5111 Encounter for antineoplastic chemotherapy: Secondary | ICD-10-CM | POA: Diagnosis not present

## 2024-04-12 DIAGNOSIS — Z510 Encounter for antineoplastic radiation therapy: Secondary | ICD-10-CM | POA: Diagnosis not present

## 2024-04-12 DIAGNOSIS — Z95828 Presence of other vascular implants and grafts: Secondary | ICD-10-CM | POA: Diagnosis not present

## 2024-04-12 LAB — MAGNESIUM: Magnesium: 2 mg/dL (ref 1.7–2.4)

## 2024-04-12 LAB — SAMPLE TO BLOOD BANK

## 2024-04-12 LAB — CMP (CANCER CENTER ONLY)
ALT: 12 U/L (ref 0–44)
AST: 20 U/L (ref 15–41)
Albumin: 4.1 g/dL (ref 3.5–5.0)
Alkaline Phosphatase: 65 U/L (ref 38–126)
Anion gap: 8 (ref 5–15)
BUN: 24 mg/dL — ABNORMAL HIGH (ref 8–23)
CO2: 23 mmol/L (ref 22–32)
Calcium: 9.6 mg/dL (ref 8.9–10.3)
Chloride: 104 mmol/L (ref 98–111)
Creatinine: 1.94 mg/dL — ABNORMAL HIGH (ref 0.61–1.24)
GFR, Estimated: 34 mL/min — ABNORMAL LOW (ref >60)
Glucose, Bld: 95 mg/dL (ref 70–99)
Potassium: 4.6 mmol/L (ref 3.5–5.1)
Sodium: 135 mmol/L (ref 135–145)
Total Bilirubin: 0.6 mg/dL (ref 0.0–1.2)
Total Protein: 7.6 g/dL (ref 6.5–8.1)

## 2024-04-12 LAB — URINALYSIS, COMPLETE (UACMP) WITH MICROSCOPIC
Bacteria, UA: NONE SEEN
Bilirubin Urine: NEGATIVE
Glucose, UA: NEGATIVE mg/dL
Ketones, ur: NEGATIVE mg/dL
Nitrite: NEGATIVE
Protein, ur: 100 mg/dL — AB
RBC / HPF: >50 RBC/hpf (ref 0–5)
Specific Gravity, Urine: 1.017 (ref 1.005–1.030)
WBC, UA: >50 WBC/hpf (ref 0–5)
pH: 5 (ref 5.0–8.0)

## 2024-04-12 LAB — CBC WITH DIFFERENTIAL (CANCER CENTER ONLY)
Abs Immature Granulocytes: 0.01 K/uL (ref 0.00–0.07)
Basophils Absolute: 0 K/uL (ref 0.0–0.1)
Basophils Relative: 1 %
Eosinophils Absolute: 0.2 K/uL (ref 0.0–0.5)
Eosinophils Relative: 6 %
HCT: 37.1 % — ABNORMAL LOW (ref 39.0–52.0)
Hemoglobin: 12.7 g/dL — ABNORMAL LOW (ref 13.0–17.0)
Immature Granulocytes: 0 %
Lymphocytes Relative: 23 %
Lymphs Abs: 0.7 K/uL (ref 0.7–4.0)
MCH: 31.9 pg (ref 26.0–34.0)
MCHC: 34.2 g/dL (ref 30.0–36.0)
MCV: 93.2 fL (ref 80.0–100.0)
Monocytes Absolute: 0.6 K/uL (ref 0.1–1.0)
Monocytes Relative: 20 %
Neutro Abs: 1.5 K/uL — ABNORMAL LOW (ref 1.7–7.7)
Neutrophils Relative %: 50 %
Platelet Count: 176 K/uL (ref 150–400)
RBC: 3.98 MIL/uL — ABNORMAL LOW (ref 4.22–5.81)
RDW: 18.4 % — ABNORMAL HIGH (ref 11.5–15.5)
WBC Count: 3.1 K/uL — ABNORMAL LOW (ref 4.0–10.5)
nRBC: 0 % (ref 0.0–0.2)

## 2024-04-12 NOTE — Assessment & Plan Note (Addendum)
 1 more dose of antibiotics. Foul smelling urine resolved

## 2024-04-12 NOTE — Assessment & Plan Note (Addendum)
 Labs showing recovering of blood counts Repeat labs and PET in early January Schedule lateral Mediport

## 2024-04-12 NOTE — Assessment & Plan Note (Addendum)
 Follow up for flushing in November Obtain labs in January when return for PET

## 2024-04-13 ENCOUNTER — Ambulatory Visit: Payer: Self-pay

## 2024-04-13 ENCOUNTER — Other Ambulatory Visit: Payer: Self-pay

## 2024-04-13 LAB — URINE CULTURE: Culture: NO GROWTH

## 2024-04-13 NOTE — Progress Notes (Signed)
  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    The patient was available for call today.   Symptoms of fatigue have not improved since completing therapy.  Symptoms of skin changes have improved since completing therapy.  Symptoms of nausea or vomiting have improved since completing therapy.   The patient has scheduled follow up with his medical oncologist Dr. Tina for ongoing surveillance, and was encouraged to call if he  develops concerns or questions regarding radiation.

## 2024-04-20 ENCOUNTER — Telehealth: Payer: Self-pay

## 2024-04-20 NOTE — Telephone Encounter (Signed)
 Spoke with the patient could not find a record of a call. Did inform him of his phone call appt with Radiation for his post treatment call. We confirmed his appt on 11/24 for port flush also. Arland Legions BSN RN

## 2024-04-20 NOTE — Telephone Encounter (Signed)
 Returning phone call requests a call back. Arland Legions BSN RN

## 2024-04-23 ENCOUNTER — Other Ambulatory Visit: Payer: Self-pay | Admitting: *Deleted

## 2024-04-23 DIAGNOSIS — N3289 Other specified disorders of bladder: Secondary | ICD-10-CM

## 2024-04-23 MED ORDER — OXYBUTYNIN CHLORIDE 5 MG PO TABS: 5.0000 mg | ORAL_TABLET | Freq: Two times a day (BID) | ORAL | 0 refills | Status: DC | PRN

## 2024-04-27 ENCOUNTER — Ambulatory Visit
Admission: RE | Admit: 2024-04-27 | Discharge: 2024-04-27 | Disposition: A | Discharge status: Home / Self Care | Source: Ambulatory Visit | Attending: Radiation Oncology | Admitting: Radiation Oncology

## 2024-04-27 DIAGNOSIS — C61 Malignant neoplasm of prostate: Secondary | ICD-10-CM

## 2024-05-01 ENCOUNTER — Other Ambulatory Visit: Payer: Self-pay

## 2024-05-01 DIAGNOSIS — N3289 Other specified disorders of bladder: Secondary | ICD-10-CM

## 2024-05-10 ENCOUNTER — Ambulatory Visit: Payer: Self-pay

## 2024-05-10 ENCOUNTER — Inpatient Hospital Stay

## 2024-05-10 ENCOUNTER — Other Ambulatory Visit: Payer: Self-pay

## 2024-05-10 ENCOUNTER — Telehealth: Payer: Self-pay

## 2024-05-10 DIAGNOSIS — C679 Malignant neoplasm of bladder, unspecified: Secondary | ICD-10-CM

## 2024-05-10 DIAGNOSIS — D709 Neutropenia, unspecified: Secondary | ICD-10-CM

## 2024-05-10 LAB — CBC WITH DIFFERENTIAL (CANCER CENTER ONLY)
Abs Immature Granulocytes: 0 K/uL (ref 0.00–0.07)
Basophils Absolute: 0 K/uL (ref 0.0–0.1)
Basophils Relative: 0 %
Eosinophils Absolute: 0.1 K/uL (ref 0.0–0.5)
Eosinophils Relative: 4 %
HCT: 34.1 % — ABNORMAL LOW (ref 39.0–52.0)
Hemoglobin: 11.5 g/dL — ABNORMAL LOW (ref 13.0–17.0)
Immature Granulocytes: 0 %
Lymphocytes Relative: 33 %
Lymphs Abs: 0.8 K/uL (ref 0.7–4.0)
MCH: 32.7 pg (ref 26.0–34.0)
MCHC: 33.7 g/dL (ref 30.0–36.0)
MCV: 96.9 fL (ref 80.0–100.0)
Monocytes Absolute: 0.4 K/uL (ref 0.1–1.0)
Monocytes Relative: 15 %
Neutro Abs: 1.2 K/uL — ABNORMAL LOW (ref 1.7–7.7)
Neutrophils Relative %: 48 %
Platelet Count: 144 K/uL — ABNORMAL LOW (ref 150–400)
RBC: 3.52 MIL/uL — ABNORMAL LOW (ref 4.22–5.81)
RDW: 16.3 % — ABNORMAL HIGH (ref 11.5–15.5)
WBC Count: 2.5 K/uL — ABNORMAL LOW (ref 4.0–10.5)
nRBC: 0 % (ref 0.0–0.2)

## 2024-05-10 LAB — CMP (CANCER CENTER ONLY)
ALT: 11 U/L (ref 0–44)
AST: 20 U/L (ref 15–41)
Albumin: 3.9 g/dL (ref 3.5–5.0)
Alkaline Phosphatase: 60 U/L (ref 38–126)
Anion gap: 8 (ref 5–15)
BUN: 18 mg/dL (ref 8–23)
CO2: 26 mmol/L (ref 22–32)
Calcium: 9.2 mg/dL (ref 8.9–10.3)
Chloride: 109 mmol/L (ref 98–111)
Creatinine: 1.28 mg/dL — ABNORMAL HIGH (ref 0.61–1.24)
GFR, Estimated: 55 mL/min — ABNORMAL LOW (ref >60)
Glucose, Bld: 103 mg/dL — ABNORMAL HIGH (ref 70–99)
Potassium: 4 mmol/L (ref 3.5–5.1)
Sodium: 143 mmol/L (ref 135–145)
Total Bilirubin: 0.5 mg/dL (ref 0.0–1.2)
Total Protein: 6.7 g/dL (ref 6.5–8.1)

## 2024-05-10 NOTE — Telephone Encounter (Signed)
 T/C to pt per Dr Fredrik request to see how pt was feeling. Additional labs ordered, if needed, for B12 and folate to check for deficiencies and fatigue Spoke with pt and he said he is doing really well.  He has some fatigue but nothing extreme.  He feels like it is from not being very active.  Pt advised of Dr Fredrik concerns and recommendations of additional labs.  Pt declined at this time but will call back if fatigue becomes a concern. Pt advised of lab results and neutropenic precautions with VU

## 2024-06-04 ENCOUNTER — Emergency Department (HOSPITAL_COMMUNITY)

## 2024-06-04 ENCOUNTER — Inpatient Hospital Stay (HOSPITAL_COMMUNITY)
Admission: EM | Admit: 2024-06-04 | Discharge: 2024-06-08 | DRG: 193 | Disposition: A | Discharge status: Home / Self Care | Attending: Internal Medicine | Admitting: Internal Medicine

## 2024-06-04 ENCOUNTER — Encounter (HOSPITAL_COMMUNITY): Payer: Self-pay

## 2024-06-04 ENCOUNTER — Other Ambulatory Visit: Payer: Self-pay

## 2024-06-04 DIAGNOSIS — Z923 Personal history of irradiation: Secondary | ICD-10-CM

## 2024-06-04 DIAGNOSIS — R059 Cough, unspecified: Secondary | ICD-10-CM

## 2024-06-04 DIAGNOSIS — K5900 Constipation, unspecified: Secondary | ICD-10-CM | POA: Diagnosis present

## 2024-06-04 DIAGNOSIS — Z6821 Body mass index (BMI) 21.0-21.9, adult: Secondary | ICD-10-CM

## 2024-06-04 DIAGNOSIS — C61 Malignant neoplasm of prostate: Secondary | ICD-10-CM

## 2024-06-04 DIAGNOSIS — I471 Supraventricular tachycardia, unspecified: Secondary | ICD-10-CM | POA: Diagnosis not present

## 2024-06-04 DIAGNOSIS — R54 Age-related physical debility: Secondary | ICD-10-CM | POA: Diagnosis present

## 2024-06-04 DIAGNOSIS — N3289 Other specified disorders of bladder: Secondary | ICD-10-CM | POA: Diagnosis present

## 2024-06-04 DIAGNOSIS — N1831 Chronic kidney disease, stage 3a: Secondary | ICD-10-CM | POA: Diagnosis present

## 2024-06-04 DIAGNOSIS — J154 Pneumonia due to other streptococci: Principal | ICD-10-CM | POA: Diagnosis present

## 2024-06-04 DIAGNOSIS — Z8616 Personal history of COVID-19: Secondary | ICD-10-CM

## 2024-06-04 DIAGNOSIS — Z886 Allergy status to analgesic agent status: Secondary | ICD-10-CM

## 2024-06-04 DIAGNOSIS — J188 Other pneumonia, unspecified organism: Principal | ICD-10-CM

## 2024-06-04 DIAGNOSIS — Z79899 Other long term (current) drug therapy: Secondary | ICD-10-CM

## 2024-06-04 DIAGNOSIS — R7989 Other specified abnormal findings of blood chemistry: Secondary | ICD-10-CM | POA: Diagnosis present

## 2024-06-04 DIAGNOSIS — F419 Anxiety disorder, unspecified: Secondary | ICD-10-CM | POA: Diagnosis present

## 2024-06-04 DIAGNOSIS — Z9079 Acquired absence of other genital organ(s): Secondary | ICD-10-CM

## 2024-06-04 DIAGNOSIS — D631 Anemia in chronic kidney disease: Secondary | ICD-10-CM | POA: Diagnosis present

## 2024-06-04 DIAGNOSIS — Z1152 Encounter for screening for COVID-19: Secondary | ICD-10-CM

## 2024-06-04 DIAGNOSIS — J189 Pneumonia, unspecified organism: Secondary | ICD-10-CM | POA: Diagnosis not present

## 2024-06-04 DIAGNOSIS — Z883 Allergy status to other anti-infective agents status: Secondary | ICD-10-CM

## 2024-06-04 DIAGNOSIS — D849 Immunodeficiency, unspecified: Secondary | ICD-10-CM | POA: Diagnosis present

## 2024-06-04 DIAGNOSIS — Z9221 Personal history of antineoplastic chemotherapy: Secondary | ICD-10-CM

## 2024-06-04 DIAGNOSIS — Z79890 Hormone replacement therapy (postmenopausal): Secondary | ICD-10-CM

## 2024-06-04 DIAGNOSIS — G4700 Insomnia, unspecified: Secondary | ICD-10-CM | POA: Diagnosis present

## 2024-06-04 DIAGNOSIS — Z87891 Personal history of nicotine dependence: Secondary | ICD-10-CM

## 2024-06-04 DIAGNOSIS — E039 Hypothyroidism, unspecified: Secondary | ICD-10-CM | POA: Diagnosis not present

## 2024-06-04 DIAGNOSIS — F32A Depression, unspecified: Secondary | ICD-10-CM | POA: Diagnosis present

## 2024-06-04 DIAGNOSIS — Z8546 Personal history of malignant neoplasm of prostate: Secondary | ICD-10-CM

## 2024-06-04 DIAGNOSIS — E43 Unspecified severe protein-calorie malnutrition: Secondary | ICD-10-CM | POA: Diagnosis present

## 2024-06-04 DIAGNOSIS — C679 Malignant neoplasm of bladder, unspecified: Secondary | ICD-10-CM | POA: Diagnosis present

## 2024-06-04 DIAGNOSIS — Z8551 Personal history of malignant neoplasm of bladder: Secondary | ICD-10-CM

## 2024-06-04 DIAGNOSIS — Z95828 Presence of other vascular implants and grafts: Secondary | ICD-10-CM

## 2024-06-04 DIAGNOSIS — Z856 Personal history of leukemia: Secondary | ICD-10-CM

## 2024-06-04 DIAGNOSIS — Z8249 Family history of ischemic heart disease and other diseases of the circulatory system: Secondary | ICD-10-CM

## 2024-06-04 LAB — I-STAT CG4 LACTIC ACID, ED: Lactic Acid, Venous: 1.2 mmol/L (ref 0.5–1.9)

## 2024-06-04 LAB — CBC
HCT: 36.8 % — ABNORMAL LOW (ref 39.0–52.0)
Hemoglobin: 12.5 g/dL — ABNORMAL LOW (ref 13.0–17.0)
MCH: 32.4 pg (ref 26.0–34.0)
MCHC: 34 g/dL (ref 30.0–36.0)
MCV: 95.3 fL (ref 80.0–100.0)
Platelets: 187 K/uL (ref 150–400)
RBC: 3.86 MIL/uL — ABNORMAL LOW (ref 4.22–5.81)
RDW: 13.2 % (ref 11.5–15.5)
WBC: 11.3 K/uL — ABNORMAL HIGH (ref 4.0–10.5)
nRBC: 0 % (ref 0.0–0.2)

## 2024-06-04 LAB — RESP PANEL BY RT-PCR (RSV, FLU A&B, COVID)  RVPGX2
Influenza A by PCR: NEGATIVE
Influenza B by PCR: NEGATIVE
Resp Syncytial Virus by PCR: NEGATIVE
SARS Coronavirus 2 by RT PCR: NEGATIVE

## 2024-06-04 LAB — BASIC METABOLIC PANEL WITH GFR
Anion gap: 13 (ref 5–15)
BUN: 13 mg/dL (ref 8–23)
CO2: 25 mmol/L (ref 22–32)
Calcium: 9.2 mg/dL (ref 8.9–10.3)
Chloride: 99 mmol/L (ref 98–111)
Creatinine, Ser: 1.27 mg/dL — ABNORMAL HIGH (ref 0.61–1.24)
GFR, Estimated: 56 mL/min — ABNORMAL LOW
Glucose, Bld: 157 mg/dL — ABNORMAL HIGH (ref 70–99)
Potassium: 3.8 mmol/L (ref 3.5–5.1)
Sodium: 136 mmol/L (ref 135–145)

## 2024-06-04 MED ORDER — IOHEXOL 300 MG/ML  SOLN
75.0000 mL | Freq: Once | INTRAMUSCULAR | Status: AC | PRN
  Administered 2024-06-04: 75 mL via INTRAVENOUS

## 2024-06-04 MED ORDER — ACETAMINOPHEN 650 MG RE SUPP
650.0000 mg | Freq: Four times a day (QID) | RECTAL | Status: DC | PRN
  Filled 2024-06-04: qty 1

## 2024-06-04 MED ORDER — ONDANSETRON HCL 4 MG/2ML IJ SOLN: 4.0000 mg | Freq: Four times a day (QID) | INTRAMUSCULAR | Status: DC | PRN

## 2024-06-04 MED ORDER — HEPARIN SODIUM (PORCINE) 5000 UNIT/ML IJ SOLN
5000.0000 [IU] | Freq: Three times a day (TID) | INTRAMUSCULAR | Status: DC
  Administered 2024-06-04 – 2024-06-05 (×2): 5000 [IU] via SUBCUTANEOUS
  Filled 2024-06-04 (×2): qty 1

## 2024-06-04 MED ORDER — SODIUM CHLORIDE 0.9 % IV BOLUS
1000.0000 mL | Freq: Once | INTRAVENOUS | Status: AC
  Administered 2024-06-04: 1000 mL via INTRAVENOUS

## 2024-06-04 MED ORDER — GUAIFENESIN 100 MG/5ML PO LIQD
5.0000 mL | ORAL | Status: DC | PRN
  Administered 2024-06-05 – 2024-06-07 (×6): 5 mL via ORAL
  Filled 2024-06-04 (×2): qty 5
  Filled 2024-06-04: qty 10
  Filled 2024-06-04 (×7): qty 5
  Filled 2024-06-04: qty 10
  Filled 2024-06-04 (×9): qty 5
  Filled 2024-06-04: qty 10

## 2024-06-04 MED ORDER — SODIUM CHLORIDE 0.9 % IV SOLN
500.0000 mg | INTRAVENOUS | Status: DC
  Administered 2024-06-05: 500 mg via INTRAVENOUS
  Filled 2024-06-04 (×2): qty 5

## 2024-06-04 MED ORDER — BOOST PLUS PO LIQD
237.0000 mL | Freq: Three times a day (TID) | ORAL | Status: DC
  Filled 2024-06-04 (×6): qty 237

## 2024-06-04 MED ORDER — SODIUM CHLORIDE 0.9% FLUSH
3.0000 mL | Freq: Two times a day (BID) | INTRAVENOUS | Status: DC
  Administered 2024-06-04 – 2024-06-05 (×2): 3 mL via INTRAVENOUS

## 2024-06-04 MED ORDER — ONDANSETRON HCL 4 MG PO TABS
4.0000 mg | ORAL_TABLET | Freq: Four times a day (QID) | ORAL | Status: DC | PRN
  Filled 2024-06-04: qty 1

## 2024-06-04 MED ORDER — BENZONATATE 100 MG PO CAPS
200.0000 mg | ORAL_CAPSULE | Freq: Three times a day (TID) | ORAL | Status: DC
  Administered 2024-06-04 – 2024-06-08 (×11): 200 mg via ORAL
  Filled 2024-06-04 (×14): qty 2

## 2024-06-04 MED ORDER — LACTATED RINGERS IV BOLUS
1000.0000 mL | Freq: Once | INTRAVENOUS | Status: AC
  Administered 2024-06-04: 1000 mL via INTRAVENOUS

## 2024-06-04 MED ORDER — SODIUM CHLORIDE 0.9 % IV SOLN
500.0000 mg | Freq: Once | INTRAVENOUS | Status: AC
  Administered 2024-06-04: 500 mg via INTRAVENOUS
  Filled 2024-06-04 (×2): qty 5

## 2024-06-04 MED ORDER — SODIUM CHLORIDE 0.9 % IV SOLN
1.0000 g | Freq: Once | INTRAVENOUS | Status: AC
  Administered 2024-06-04: 1 g via INTRAVENOUS
  Filled 2024-06-04: qty 10

## 2024-06-04 MED ORDER — ENOXAPARIN SODIUM 30 MG/0.3ML IJ SOSY: 30.0000 mg | PREFILLED_SYRINGE | INTRAMUSCULAR | Status: DC

## 2024-06-04 MED ORDER — ACETAMINOPHEN 325 MG PO TABS
650.0000 mg | ORAL_TABLET | Freq: Four times a day (QID) | ORAL | Status: DC | PRN
  Administered 2024-06-05 – 2024-06-08 (×2): 650 mg via ORAL
  Filled 2024-06-04 (×3): qty 2

## 2024-06-04 MED ORDER — SODIUM CHLORIDE 0.9 % IV SOLN
2.0000 g | INTRAVENOUS | Status: DC
  Administered 2024-06-05 – 2024-06-08 (×4): 2 g via INTRAVENOUS
  Filled 2024-06-04 (×4): qty 20

## 2024-06-04 MED ORDER — BISACODYL 5 MG PO TBEC
5.0000 mg | DELAYED_RELEASE_TABLET | Freq: Every day | ORAL | Status: DC | PRN
  Filled 2024-06-04: qty 1

## 2024-06-04 NOTE — ED Triage Notes (Addendum)
 Patient has had a cough for 10 days with white mucus. Went to the [facility] today and he was told he has pneumonia. Feels weak, light headed.

## 2024-06-04 NOTE — ED Provider Notes (Signed)
 " [facility] [place] EMERGENCY DEPARTMENT AT [facility] Provider Note   CSN: [id number] Arrival date & time: 06/04/24  1433     Patient presents with: Cough   Kenneth Sanford is a 83 y.o. male.  He has a history of prostate cancer and states he just finished chemo and radiation last month.  He said he had COVID just after Thanksgiving and since then has been having productive cough with yellow sputum, shortness of breath and poor p.o. intake.  Went to the [facility] today where they did an x-ray and gave him prescription for Tessalon  Perles.  They called him back a few hours later and said to get to the emergency department due to abnormalities on his chest x-ray.  No hemoptysis.  No vomiting.  He does not think he has had a fever.  He has had some loose stools on and off.  No dysuria.  Lives at home with his granddaughter  {Add pertinent medical, surgical, social history, OB history to YEP:[id number]} The history is provided by the patient.  Cough Cough characteristics:  Productive Sputum characteristics:  Yellow Severity:  Moderate Onset quality:  Gradual Duration:  4 weeks Timing:  Intermittent Progression:  Unchanged Chronicity:  New Relieved by:  Nothing Worsened by:  Activity Associated symptoms: shortness of breath   Associated symptoms: no chest pain and no fever        Prior to Admission medications  Medication Sig Start Date End Date Taking? Authorizing Provider  acetaminophen  (TYLENOL ) 500 MG tablet Take 500-1,000 mg by mouth every 6 (six) hours as needed for mild pain or headache.    [provider]  Cholecalciferol  (VITAMIN D3) 50 MCG (2000 UT) TABS Take 2,000 Units by mouth at bedtime.    [provider]  docusate sodium (COLACE) 100 MG capsule Take 100 mg by mouth daily as needed for moderate constipation. Patient not taking: Reported on 01/15/2024    [provider]  lidocaine -prilocaine  (EMLA ) cream Apply to affected area once 01/16/24   Tina Pauletta BROCKS, MD  mirtazapine (REMERON) 15 MG tablet Take 7.5 mg by mouth at bedtime. 03/31/24   [provider]  ondansetron  (ZOFRAN ) 8 MG tablet Take 1 tablet (8 mg total) by mouth every 8 (eight) hours as needed for nausea or vomiting. 01/16/24   Tina Pauletta BROCKS, MD  oxybutynin  (DITROPAN ) 5 MG tablet TAKE 1 TABLET (5 MG TOTAL) BY MOUTH 2 (TWO) TIMES DAILY AS NEEDED FOR BLADDER SPASMS. 05/03/24   Tina Pauletta BROCKS, MD  prochlorperazine  (COMPAZINE ) 10 MG tablet Take 1 tablet (10 mg total) by mouth every 6 (six) hours as needed for nausea or vomiting. 01/16/24   Tina Pauletta BROCKS, MD  sulfamethoxazole -trimethoprim  (BACTRIM  DS) 800-160 MG tablet Take twice a day for 5 days. 04/06/24   Tina Pauletta BROCKS, MD  SYNTHROID  50 MCG tablet Take 50 mcg by mouth daily before breakfast.    [provider]    Allergies: Acyclovir and related, Amphotericin b, and Aspirin    Review of Systems  Constitutional:  Negative for fever.  Respiratory:  Positive for cough and shortness of breath.   Cardiovascular:  Negative for chest pain.    Updated Vital Signs BP 125/78   Pulse (!) 122   Temp 98.9 F (37.2 C) (Oral)   Resp (!) 21   Ht 5' 5 (1.651 m)   Wt 59 kg   SpO2 95%   BMI 21.63 kg/m   Physical Exam Vitals and nursing note reviewed.  Constitutional:      General: He is not in acute distress.    Appearance: Normal appearance. He is well-developed.  HENT:     Head: Normocephalic and atraumatic.  Eyes:     Conjunctiva/sclera: Conjunctivae normal.  Cardiovascular:     Rate and Rhythm: Normal rate and regular rhythm.     Heart sounds: No murmur heard. Pulmonary:     Effort: Pulmonary effort is normal. No respiratory distress.     Breath sounds: Normal breath sounds.  Abdominal:     Palpations: Abdomen is soft.     Tenderness: There is no abdominal tenderness. There is no guarding or rebound.  Musculoskeletal:        General: No deformity.     Cervical back: Neck supple.  Skin:     General: Skin is warm and dry.     Capillary Refill: Capillary refill takes less than 2 seconds.  Neurological:     General: No focal deficit present.     Mental Status: He is alert.     Motor: No weakness.     (all labs ordered are listed, but only abnormal results are displayed) Labs Reviewed  BASIC METABOLIC PANEL WITH GFR - Abnormal; Notable for the following components:      Result Value   Glucose, Bld 157 (*)    Creatinine, Ser 1.27 (*)    GFR, Estimated 56 (*)    All other components within normal limits  CBC - Abnormal; Notable for the following components:   WBC 11.3 (*)    RBC 3.86 (*)    Hemoglobin 12.5 (*)    HCT 36.8 (*)    All other components within normal limits  RESP PANEL BY RT-PCR (RSV, FLU A&B, COVID)  [id number]  CULTURE, BLOOD (ROUTINE X 2)  CULTURE, BLOOD (ROUTINE X 2)  I-STAT CG4 LACTIC ACID, ED  I-STAT CG4 LACTIC ACID, ED    EKG: EKG Interpretation Date/Time:  Friday June 04 2024 16:15:57 EST Ventricular Rate:  98 PR Interval:  175 QRS Duration:  81 QT Interval:  360 QTC Calculation: 460 R Axis:   -30  Text Interpretation: Sinus rhythm Left axis deviation Abnormal R-wave progression, early transition No significant change since prior 5/25 Confirmed by Towana Sharper [phone]) on 06/04/2024 4:28:55 PM  Radiology: CT Chest W Contrast Result Date: 06/04/2024 CLINICAL DATA:  Pneumonia, complication suspected, xray done EXAM: CT CHEST WITH CONTRAST TECHNIQUE: Multidetector CT imaging of the chest was performed during intravenous contrast administration. RADIATION DOSE REDUCTION: This exam was performed according to the departmental dose-optimization program which includes automated exposure control, adjustment of the mA and/or kV according to patient size and/or use of iterative reconstruction technique. CONTRAST:  75mL OMNIPAQUE  IOHEXOL  300 MG/ML  SOLN COMPARISON:  None Available. FINDINGS: Cardiovascular: Normal cardiac size. No pericardial effusion.  No aortic aneurysm. Mediastinum/Nodes: Visualized thyroid gland appears grossly unremarkable. No solid / cystic mediastinal masses. The esophagus is nondistended precluding optimal assessment. There are mildly prominent bilateral hilar lymph nodes which are nonspecific. No mediastinal or axillary lymphadenopathy by size criteria. Lungs/Pleura: The central tracheo-bronchial tree is patent. There is mild, smooth, circumferential thickening of the segmental and subsegmental bronchial walls, throughout bilateral lungs, which is nonspecific. Findings are most commonly seen with bronchitis or reactive airway disease, such as asthma. There are heterogeneous opacities in the bilateral lower lobes without significant volume loss, favoring multilobar pneumonia. Follow-up to clearing is recommended. No pleural effusion or pneumothorax. Evaluation for discrete lung nodule is limited due to  background lung parenchymal changes. There is a subcentimeter sized noncalcified opacity in the right major fissure, favored to represent intra fissural lymph node. Upper Abdomen: There are several simple cysts in the right kidney with largest arising from the upper pole, posteriorly measuring up to 5.6 x 7.0 cm. There is contracted gallbladder containing small volume calcified gallstones. There is diffuse thickening of bilateral adrenal glands, without discrete nodule. Findings are nonspecific but mostly associated with adrenal hyperplasia. Remaining visualized upper abdominal viscera within normal limits. Post hernia repair changes noted in the epigastric region. Musculoskeletal: A CT Port-a-Cath is seen in the right upper chest wall with the catheter terminating in the cavo-atrial junction region. Visualized soft tissues of the chest wall are otherwise grossly unremarkable. No suspicious osseous lesions. There are mild to moderate multilevel degenerative changes in the visualized spine. IMPRESSION: 1. There are heterogeneous opacities in  the bilateral lower lobes without significant volume loss, favoring multilobar pneumonia. Follow-up to clearing is recommended. 2. Multiple other nonacute observations, as described above. Aortic Atherosclerosis (ICD10-I70.0). Electronically Signed   By: Ree Molt M.D.   On: 06/04/2024 17:48   DG Chest 2 View Result Date: 06/04/2024 CLINICAL DATA:  Shortness of breath EXAM: CHEST - 2 VIEW COMPARISON:  09/18/2021, PET CT 11/28/2023 FINDINGS: Right-sided central venous port tip at the cavoatrial junction. Hypoventilatory changes with mild basilar atelectasis on the left. Chronic elevation of left diaphragm. Probable bandlike atelectasis in the right hilar area. Stable cardiomediastinal silhouette. No pleural effusion or pneumothorax. Evidence of prior hernia repair. IMPRESSION: Hypoventilatory changes with left basilar atelectasis and probable platelike atelectasis in the right hilar area. Electronically Signed   By: Luke Bun M.D.   On: 06/04/2024 16:02    {Document cardiac monitor, telemetry assessment procedure when appropriate:32947} Procedures   Medications Ordered in the ED  cefTRIAXone  (ROCEPHIN ) 1 g in sodium chloride  0.9 % 100 mL IVPB (has no administration in time range)  azithromycin  (ZITHROMAX ) 500 mg in sodium chloride  0.9 % 250 mL IVPB (has no administration in time range)  sodium chloride  0.9 % bolus 1,000 mL (1,000 mLs Intravenous New Bag/Given 06/04/24 1811)  iohexol  (OMNIPAQUE ) 300 MG/ML solution 75 mL (75 mLs Intravenous Contrast Given 06/04/24 1718)    Clinical Course as of 06/04/24 1812  Fri Jun 04, 2024  1609 Chest x-ray with possible mass right hilum, left base atelectasis.  Awaiting radiology reading. [MB]  8197 Patient seen today showing multifocal pneumonia.  At his age with his comorbid medical history feel he would benefit from mission to the hospital for further management.  He is in agreement with plan for admission.  Have paged hospitalist. [MB]  1811  Discussed with Dr. Arthea.  He will evaluate patient for admission.  His port score would put him at 9% mortality. [MB]    Clinical Course User Index [MB] Towana Ozell BROCKS, MD   {Click here for ABCD2, HEART and other calculators REFRESH Note before signing:1}                              Medical Decision Making Amount and/or Complexity of Data Reviewed Labs: ordered. Radiology: ordered.  Risk Prescription drug management.   This patient complains of ***; this involves an extensive number of treatment Options and is a complaint that carries with it a high risk of complications and morbidity. The differential includes ***  I ordered, reviewed and interpreted labs, which included *** I ordered medication ***  and reviewed PMP when indicated. I ordered imaging studies which included *** and I independently    visualized and interpreted imaging which showed *** Additional history obtained from *** Previous records obtained and reviewed *** I consulted *** and discussed lab and imaging findings and discussed disposition.  Cardiac monitoring reviewed, *** Social determinants considered, *** Critical Interventions: ***  After the interventions stated above, I reevaluated the patient and found *** Admission and further testing considered, ***   {Document critical care time when appropriate  Document review of labs and clinical decision tools ie CHADS2VASC2, etc  Document your independent review of radiology images and any outside records  Document your discussion with family members, caretakers and with consultants  Document social determinants of health affecting pt's care  Document your decision making why or why not admission, treatments were needed:32947:::1}   Final diagnoses:  None    ED Discharge Orders     None        "

## 2024-06-04 NOTE — H&P (Addendum)
 " History and Physical    Patient: Kenneth Sanford FMW:[id number] DOB: 10/26/1940 DOA: 06/04/2024 DOS: the patient was seen and examined on 06/04/2024 PCP: Benjamine Aland, MD  Patient coming from: Home  Chief Complaint:  Chief Complaint  Patient presents with   Cough   HPI: Kenneth Sanford is a 83 y.o. male with medical history significant for hypothyroidism, unintentional weight loss, and bladder cancer who completed chemo and radiation on 03/2024.  The patient says he has had trouble with a severe cough since Thanksgiving day.  Not a whole lot of shortness of breath.  No fevers or chills at this time.  He is having some diarrhea intermittently.  The patient says he cannot even complete a full sentence because his cough is so severe.  He went to the [facility] to have it checked out because it was not improving.  He was put on cough medication.  A chest x-ray was done but the patient was sent home.  He was called back today and told to present to the emergency department because his chest x-ray reveals bilateral pneumonia. In the emergency department the patient's O2 sats were 95% on room air.  He was slightly tachypneic with respiratory rate in the low 20s.  His heart rate was 97.  Blood pressure 107/70. The patient's white blood cell count was 11.3.  He is COVID, flu, and RSV negative.  His creatinine is elevated at 1.27 but that appears to be close to his baseline.  Because of the patient's age and frail status and immunocompromise state he will be admitted to the hospitalist service for IV antibiotics.    Review of Systems: As mentioned in the history of present illness. All other systems reviewed and are negative. Past Medical History:  Diagnosis Date   Anxiety    Cancer (HCC)    leukemia   Depression    Headache    Hypothyroidism    Pneumonia    Past Surgical History:  Procedure Laterality Date   ABDOMINAL SURGERY     HERNIA REPAIR     IR IMAGING GUIDED PORT INSERTION  01/23/2024   KNEE  SURGERY Bilateral    PROSTATE BIOPSY     TRANSURETHRAL RESECTION OF BLADDER TUMOR N/A 10/31/2023   Procedure: TURBT (TRANSURETHRAL RESECTION OF BLADDER TUMOR);  Surgeon: Carolee Sherwood JONETTA DOUGLAS, MD;  Location: [facility];  Service: Urology;  Laterality: N/A;   TRANSURETHRAL RESECTION OF PROSTATE N/A 10/31/2023   Procedure: TURP (TRANSURETHRAL RESECTION OF PROSTATE);  Surgeon: Carolee Sherwood JONETTA DOUGLAS, MD;  Location: [facility];  Service: Urology;  Laterality: N/A;   Social History:  reports that he quit smoking about 40 years ago. His smoking use included cigarettes. He has never used smokeless tobacco. He reports that he does not drink alcohol and does not use drugs.  Allergies[1]  Family History  Problem Relation Age of Onset   Hypertension Mother     Prior to Admission medications  Medication Sig Start Date End Date Taking? Authorizing Provider  acetaminophen  (TYLENOL ) 500 MG tablet Take 500-1,000 mg by mouth every 6 (six) hours as needed for mild pain or headache.    [provider]  Cholecalciferol  (VITAMIN D3) 50 MCG (2000 UT) TABS Take 2,000 Units by mouth at bedtime.    [provider]  docusate sodium (COLACE) 100 MG capsule Take 100 mg by mouth daily as needed for moderate constipation. Patient not taking: Reported on 01/15/2024    [provider]  lidocaine -prilocaine  (EMLA ) cream  Apply to affected area once 01/16/24   Tina Pauletta BROCKS, MD  mirtazapine (REMERON) 15 MG tablet Take 7.5 mg by mouth at bedtime. 03/31/24   [provider]  ondansetron  (ZOFRAN ) 8 MG tablet Take 1 tablet (8 mg total) by mouth every 8 (eight) hours as needed for nausea or vomiting. 01/16/24   Tina Pauletta BROCKS, MD  oxybutynin  (DITROPAN ) 5 MG tablet TAKE 1 TABLET (5 MG TOTAL) BY MOUTH 2 (TWO) TIMES DAILY AS NEEDED FOR BLADDER SPASMS. 05/03/24   Tina Pauletta BROCKS, MD  prochlorperazine  (COMPAZINE ) 10 MG tablet Take 1 tablet (10 mg total) by mouth every 6 (six) hours as needed for nausea or vomiting.  01/16/24   Tina Pauletta BROCKS, MD  sulfamethoxazole -trimethoprim  (BACTRIM  DS) 800-160 MG tablet Take twice a day for 5 days. 04/06/24   Tina Pauletta BROCKS, MD  SYNTHROID 50 MCG tablet Take 50 mcg by mouth daily before breakfast.    [provider]    Physical Exam: Vitals:   06/04/24 1443 06/04/24 1444 06/04/24 1612  BP: 125/78  107/70  Pulse: (!) 122  97  Resp: (!) 21  (!) 23  Temp: 98.9 F (37.2 C)  98.7 F (37.1 C)  TempSrc: Oral  Oral  SpO2: 95%  95%  Weight:  59 kg   Height:  5' 5 (1.651 m)    Physical Exam:  General: No acute distress, well developed, malnourished HEENT: Normocephalic, atraumatic, PERRL Cardiovascular: Normal rate and rhythm. Distal pulses intact, murmur Pulmonary: Normal pulmonary effort, diminshed bs right base Gastrointestinal: Nondistended abdomen, soft, non-tender, normoactive bowel sounds Musculoskeletal:Normal ROM, no lower ext edema Porta cath in upper right chest Lymphadenopathy: No cervical LAD. Skin: Skin is warm and dry. Neuro: No focal deficits noted, AAOx3. PSYCH: Attentive and cooperative  Data Reviewed:  Results for orders placed or performed during the hospital encounter of 06/04/24 (from the past 24 hours)  Basic metabolic panel     Status: Abnormal   Collection Time: 06/04/24  3:05 PM  Result Value Ref Range   Sodium 136 135 - 145 mmol/L   Potassium 3.8 3.5 - 5.1 mmol/L   Chloride 99 98 - 111 mmol/L   CO2 25 22 - 32 mmol/L   Glucose, Bld 157 (H) 70 - 99 mg/dL   BUN 13 8 - 23 mg/dL   Creatinine, Ser 8.72 (H) 0.61 - 1.24 mg/dL   Calcium 9.2 8.9 - 89.6 mg/dL   GFR, Estimated 56 (L) >60 mL/min   Anion gap 13 5 - 15  CBC     Status: Abnormal   Collection Time: 06/04/24  3:05 PM  Result Value Ref Range   WBC 11.3 (H) 4.0 - 10.5 K/uL   RBC 3.86 (L) 4.22 - 5.81 MIL/uL   Hemoglobin 12.5 (L) 13.0 - 17.0 g/dL   HCT 63.1 (L) 60.9 - 47.9 %   MCV 95.3 80.0 - 100.0 fL   MCH 32.4 26.0 - 34.0 pg   MCHC 34.0 30.0 - 36.0 g/dL   RDW  86.7 88.4 - 84.4 %   Platelets 187 150 - 400 K/uL   nRBC 0.0 0.0 - 0.2 %  I-Stat Lactic Acid     Status: None   Collection Time: 06/04/24  5:00 PM  Result Value Ref Range   Lactic Acid, Venous 1.2 0.5 - 1.9 mmol/L  Resp panel by RT-PCR (RSV, Flu A&B, Covid) Anterior Nasal Swab     Status: None   Collection Time: 06/04/24  5:07 PM   Specimen:  Anterior Nasal Swab  Result Value Ref Range   SARS Coronavirus 2 by RT PCR NEGATIVE NEGATIVE   Influenza A by PCR NEGATIVE NEGATIVE   Influenza B by PCR NEGATIVE NEGATIVE   Resp Syncytial Virus by PCR NEGATIVE NEGATIVE     Assessment and Plan: Bilateral pneumonia in immunosuppressed 83 year old.  - Rocephin  and Zithromax  - Symptomatic care for severe cough - Mild hypotension possibly secondary to volume depletion from intermittent diarrhea - mild IV fluid hydration  Hypothyroidism -continue Synthroid  3.  Unintentional weight loss/protein calorie malnutrition -the patient feels this is multifactorial but in part due to his diminished appetite. - Start boost daily - Dietitian consult - Be sure to keep his bowels moving and avoid constipation  4.  History of bladder cancer.  He completed all of his chemotherapy and radiation 2 months ago.  He has his follow-up check in January to be sure that the cancer is in remission.  5. Anemia - Monitor hgb.   He follows w heme onc for this.  6. CKD - Creatinine appears to be at baseline at 1.27.  7.  DVT prophylaxis -patient was placed on subcu heparin  because of his CKD.  Monitor hemoglobin for any signs of bleeding.    Advance Care Planning:   Code Status: Prior the patient names his daughter Shelli as his surrogate decision maker and he wants to be full code.  Consults: none  Family Communication: None  Severity of Illness: The appropriate patient status for this patient is OBSERVATION. Observation status is judged to be reasonable and necessary in order to provide the required intensity of  service to ensure the patient's safety. The patient's presenting symptoms, physical exam findings, and initial radiographic and laboratory data in the context of their medical condition is felt to place them at decreased risk for further clinical deterioration. Furthermore, it is anticipated that the patient will be medically stable for discharge from the hospital within 2 midnights of admission.   Author: ARTHEA CHILD, MD 06/04/2024 6:10 PM  For on call review [email or web address].      [1]  Allergies Allergen Reactions   Acyclovir And Related Other (See Comments)    Reaction unknown   Amphotericin B Other (See Comments)    Severe chills   Aspirin Other (See Comments)    Pt cannot take due to having had leukemia   "

## 2024-06-05 DIAGNOSIS — I471 Supraventricular tachycardia, unspecified: Secondary | ICD-10-CM | POA: Diagnosis present

## 2024-06-05 DIAGNOSIS — J188 Other pneumonia, unspecified organism: Secondary | ICD-10-CM

## 2024-06-05 DIAGNOSIS — F32A Depression, unspecified: Secondary | ICD-10-CM | POA: Diagnosis present

## 2024-06-05 DIAGNOSIS — Z8616 Personal history of COVID-19: Secondary | ICD-10-CM | POA: Diagnosis not present

## 2024-06-05 DIAGNOSIS — Z9221 Personal history of antineoplastic chemotherapy: Secondary | ICD-10-CM | POA: Diagnosis not present

## 2024-06-05 DIAGNOSIS — D631 Anemia in chronic kidney disease: Secondary | ICD-10-CM | POA: Diagnosis present

## 2024-06-05 DIAGNOSIS — E039 Hypothyroidism, unspecified: Secondary | ICD-10-CM | POA: Diagnosis present

## 2024-06-05 DIAGNOSIS — N1831 Chronic kidney disease, stage 3a: Secondary | ICD-10-CM | POA: Diagnosis present

## 2024-06-05 DIAGNOSIS — R059 Cough, unspecified: Secondary | ICD-10-CM | POA: Diagnosis present

## 2024-06-05 DIAGNOSIS — Z79890 Hormone replacement therapy (postmenopausal): Secondary | ICD-10-CM | POA: Diagnosis not present

## 2024-06-05 DIAGNOSIS — J189 Pneumonia, unspecified organism: Secondary | ICD-10-CM | POA: Diagnosis not present

## 2024-06-05 DIAGNOSIS — Z856 Personal history of leukemia: Secondary | ICD-10-CM | POA: Diagnosis not present

## 2024-06-05 DIAGNOSIS — K5900 Constipation, unspecified: Secondary | ICD-10-CM | POA: Diagnosis present

## 2024-06-05 DIAGNOSIS — G4700 Insomnia, unspecified: Secondary | ICD-10-CM | POA: Diagnosis present

## 2024-06-05 DIAGNOSIS — Z8546 Personal history of malignant neoplasm of prostate: Secondary | ICD-10-CM | POA: Diagnosis not present

## 2024-06-05 DIAGNOSIS — R918 Other nonspecific abnormal finding of lung field: Secondary | ICD-10-CM | POA: Diagnosis not present

## 2024-06-05 DIAGNOSIS — Z1152 Encounter for screening for COVID-19: Secondary | ICD-10-CM | POA: Diagnosis not present

## 2024-06-05 DIAGNOSIS — Z923 Personal history of irradiation: Secondary | ICD-10-CM | POA: Diagnosis not present

## 2024-06-05 DIAGNOSIS — Z87891 Personal history of nicotine dependence: Secondary | ICD-10-CM | POA: Diagnosis not present

## 2024-06-05 DIAGNOSIS — E43 Unspecified severe protein-calorie malnutrition: Secondary | ICD-10-CM | POA: Diagnosis present

## 2024-06-05 DIAGNOSIS — N3289 Other specified disorders of bladder: Secondary | ICD-10-CM | POA: Diagnosis present

## 2024-06-05 DIAGNOSIS — Z9079 Acquired absence of other genital organ(s): Secondary | ICD-10-CM | POA: Diagnosis not present

## 2024-06-05 DIAGNOSIS — Z8249 Family history of ischemic heart disease and other diseases of the circulatory system: Secondary | ICD-10-CM | POA: Diagnosis not present

## 2024-06-05 DIAGNOSIS — R54 Age-related physical debility: Secondary | ICD-10-CM | POA: Diagnosis present

## 2024-06-05 DIAGNOSIS — Z6821 Body mass index (BMI) 21.0-21.9, adult: Secondary | ICD-10-CM | POA: Diagnosis not present

## 2024-06-05 DIAGNOSIS — Z8551 Personal history of malignant neoplasm of bladder: Secondary | ICD-10-CM | POA: Diagnosis not present

## 2024-06-05 DIAGNOSIS — J154 Pneumonia due to other streptococci: Secondary | ICD-10-CM | POA: Diagnosis present

## 2024-06-05 DIAGNOSIS — D849 Immunodeficiency, unspecified: Secondary | ICD-10-CM | POA: Diagnosis present

## 2024-06-05 LAB — MAGNESIUM: Magnesium: 2.2 mg/dL (ref 1.7–2.4)

## 2024-06-05 LAB — BASIC METABOLIC PANEL WITH GFR
Anion gap: 9 (ref 5–15)
BUN: 13 mg/dL (ref 8–23)
CO2: 25 mmol/L (ref 22–32)
Calcium: 8.2 mg/dL — ABNORMAL LOW (ref 8.9–10.3)
Chloride: 105 mmol/L (ref 98–111)
Creatinine, Ser: 1.06 mg/dL (ref 0.61–1.24)
GFR, Estimated: >60 mL/min
Glucose, Bld: 97 mg/dL (ref 70–99)
Potassium: 3.9 mmol/L (ref 3.5–5.1)
Sodium: 139 mmol/L (ref 135–145)

## 2024-06-05 LAB — CBC
HCT: 29.6 % — ABNORMAL LOW (ref 39.0–52.0)
Hemoglobin: 10 g/dL — ABNORMAL LOW (ref 13.0–17.0)
MCH: 32.6 pg (ref 26.0–34.0)
MCHC: 33.8 g/dL (ref 30.0–36.0)
MCV: 96.4 fL (ref 80.0–100.0)
Platelets: 153 K/uL (ref 150–400)
RBC: 3.07 MIL/uL — ABNORMAL LOW (ref 4.22–5.81)
RDW: 13.2 % (ref 11.5–15.5)
WBC: 5.5 K/uL (ref 4.0–10.5)
nRBC: 0 % (ref 0.0–0.2)

## 2024-06-05 LAB — PREALBUMIN: Prealbumin: 5 mg/dL — ABNORMAL LOW (ref 18–38)

## 2024-06-05 LAB — STREP PNEUMONIAE URINARY ANTIGEN: Strep Pneumo Urinary Antigen: POSITIVE — AB

## 2024-06-05 LAB — TSH: TSH: 1.37 u[IU]/mL (ref 0.350–4.500)

## 2024-06-05 LAB — MRSA NEXT GEN BY PCR, NASAL: MRSA by PCR Next Gen: NOT DETECTED

## 2024-06-05 LAB — HIV ANTIBODY (ROUTINE TESTING W REFLEX): HIV Screen 4th Generation wRfx: NONREACTIVE

## 2024-06-05 MED ORDER — LEVOTHYROXINE SODIUM 50 MCG PO TABS
50.0000 ug | ORAL_TABLET | Freq: Every day | ORAL | Status: DC
  Administered 2024-06-05 – 2024-06-08 (×4): 50 ug via ORAL
  Filled 2024-06-05 (×5): qty 1

## 2024-06-05 MED ORDER — SODIUM CHLORIDE 0.9 % IV BOLUS
500.0000 mL | Freq: Once | INTRAVENOUS | Status: AC
  Administered 2024-06-05: 500 mL via INTRAVENOUS

## 2024-06-05 MED ORDER — BOOST PLUS PO LIQD
237.0000 mL | Freq: Three times a day (TID) | ORAL | Status: DC
  Filled 2024-06-05 (×4): qty 237

## 2024-06-05 MED ORDER — IPRATROPIUM-ALBUTEROL 0.5-2.5 (3) MG/3ML IN SOLN
3.0000 mL | Freq: Four times a day (QID) | RESPIRATORY_TRACT | Status: DC | PRN
  Filled 2024-06-05: qty 3

## 2024-06-05 MED ORDER — GUAIFENESIN ER 600 MG PO TB12
600.0000 mg | ORAL_TABLET | Freq: Two times a day (BID) | ORAL | Status: DC
  Administered 2024-06-05 – 2024-06-08 (×5): 600 mg via ORAL
  Filled 2024-06-05 (×9): qty 1

## 2024-06-05 MED ORDER — ENSURE PLUS HIGH PROTEIN PO LIQD
237.0000 mL | Freq: Three times a day (TID) | ORAL | Status: DC
  Filled 2024-06-05 (×4): qty 237

## 2024-06-05 MED ORDER — ENOXAPARIN SODIUM 40 MG/0.4ML IJ SOSY
40.0000 mg | PREFILLED_SYRINGE | INTRAMUSCULAR | Status: DC
  Administered 2024-06-05 – 2024-06-07 (×3): 40 mg via SUBCUTANEOUS
  Filled 2024-06-05 (×4): qty 0.4

## 2024-06-05 NOTE — Progress Notes (Signed)
 This EMT went to room 1523 to transport pt home to the [facility]  program. Upon arrival the pt was found sitting upright in the bed asleep with a meal tray in front of him. The RN was contacted and the arrived into the pt's room. The RN then woke the pt up. This EMT then approached the pt and introduced myself. I informed the pt that I was here with [facility]  to take him home. The pt then stated that he was not ready to leave the hospital tonight. He stated that he was tired and very dizzy. Virtual RN Dawn and EMTP [place] were made aware at this time. The pt was made aware that if he did not want to leave the hospital and come on to the [facility]  program tonight that he had that option and that it was his decision. The pt then began to get emotional and was thankful to be left at the hospital for another evening. The RN was still present in the room at this time. Virtual [employer] and this EMT made comments in the [facility]  logistics GC. RN made contact with on call floor doctor and it was determined that it was okay for the pt to stay in the hospital another night. All [facility]  team members made aware at this time. As this EMT was leaving the room the pt became emotional again. The pt was made aware that everything is okay and that he is allowed to make choices for himself. The pt was comforted at this time. Floor RN made pt's granddaughter aware that he does not want to go home at this time. At this time this EMT left the room. Pt made aware that he would be reconsidered in the AM. Pt was thankful at this time and praised this [facility]  crew.

## 2024-06-05 NOTE — Plan of Care (Signed)
" °  Problem: Education: Goal: Knowledge of General Education information will improve Description: Including pain rating scale, medication(s)/side effects and non-pharmacologic comfort measures Outcome: Progressing   Problem: Health Behavior/Discharge Planning: Goal: Ability to manage health-related needs will improve Outcome: Progressing   Problem: Clinical Measurements: Goal: Respiratory complications will improve Outcome: Progressing   Problem: Activity: Goal: Risk for activity intolerance will decrease Outcome: Progressing   Problem: Elimination: Goal: Will not experience complications related to urinary retention Outcome: Progressing   Problem: Safety: Goal: Ability to remain free from injury will improve Outcome: Progressing   "

## 2024-06-05 NOTE — Hospital Course (Signed)
 [name] is an 83 year old male with history of hypothyroid, bladder cancer status post chemotherapy and radiation completion in 10/25, intermittent diarrhea, recent COVID-19 infection, unintentional weight loss.  06/04/2024: He presents to the ED for chief concerns of severe cough since Thanksgiving.  12/19: Patient admitted to [facility] hospitalist service for bilateral pneumonia in immunosuppressed patient.  First dose antibiotic initiated on 12/19: Azithromycin  500 mg IV (at 2116-hour), ceftriaxone  1 g IV (1812-hour)  12/20: Patient transferred to hospital at home care.  06/06/2024: Patient stayed 1 more night in the hospital.  12/22: I assumed care of the patient.  Day 4 of antibiotic. Patient reports he wakes up a lot in the middle of the night to urinate and to cough.  Otherwise he is feeling fine.  Patient has very little appetite.  12/23: Completed day 5 of antibiotic.  He reports he feels well. He states he has been sleeping a lot. Pt and granddaughter states the tussionex did help with the cough at night. DME Nebulizer has been ordered and paramedic will teach patient how to use. Tussionex prescribed on discharge.

## 2024-06-05 NOTE — Progress Notes (Addendum)
 " PROGRESS NOTE    Kenneth Sanford  FMW:[id number] DOB: [birth year] DOA: 06/04/2024 PCP: Benjamine Aland, MD   Brief Narrative: 83 year old with past medical history significant for hypothyroidism, unintentional weight loss, bladder cancer who completed chemoradiation 03/2024 presented with severe cough since Thanksgiving.  Reports having some diarrhea intermittently.  He was seen in the [place], chest x-ray was obtained and patient was sent home.  She was called back today to present to the ED for further evaluation because the x-ray showed bilateral pneumonia.  Patient on presentation was tachypneic, respiration rate 20s, blood pressure 107/70.  COVID flu RSV negative.  Mild elevated creatinine.  Strep pneumonia antigen positive.   Assessment & Plan:   Principal Problem:   CAP (community acquired pneumonia) Active Problems:   History of acute myeloid leukemia   Paroxysmal supraventricular tachycardia   Urothelial carcinoma of bladder (HCC)   Constipation   Port-A-Cath in place   Pneumonia   1-Strep Pneumonia, Multilobar pneumonia -Patient presents with cough, shortness of breath, leukocytosis white blood cell 11.3, CT chest:heterogeneous opacities in the bilateral lower lobes without significant volume loss, favoring multilobar pneumonia.  Influenza COVID RSV negative - Strep pneumonia antigen positive - Continue with IV ceftriaxone  and azithromycin  to treat for community-acquired pneumonia. - Guaifenesin  twice daily - Nebulizer as needed  2-Hypothyroidism: - Continue with Synthroid   3-Unintentional weight loss, protein caloric malnutrition: - Continue Ensure.  Further evaluation as an outpatient  4-History of bladder cancer: - Patient completed chemotherapy and radiation 2 months ago.  He needs follow-up in January for further evaluation   5-Anemia: -Monitor  hemoglobin -Suspect hemoconcentration on admission. - 6-CKD stage III A - Previous creatinine  1.2-- -stable      Estimated body mass index is 21.63 kg/m as calculated from the following:   Height as of this encounter: 5' 5 (1.651 m).   Weight as of this encounter: 59 kg.   DVT prophylaxis: Lovenox  Code Status: Full code Family Communication: Care discussed with patient Disposition Plan:  Status is: Observation The patient will require care spanning > 2 midnights and should be moved to inpatient because: Management of pneumonia    Consultants:  None  Procedures:  None  Antimicrobials:    Subjective: Patient reports cough and shortness of breath, not feeling at baseline.  Cough is productive.  He denies abdominal pain or diarrhea  Objective: Vitals:   06/04/24 1444 06/04/24 1612 06/04/24 2145 06/05/24 0159  BP:  107/70 106/71 106/78  Pulse:  97 94 85  Resp:  (!) 23 (!) 21 18  Temp:  98.7 F (37.1 C) 98.2 F (36.8 C) 97.8 F (36.6 C)  TempSrc:  Oral Oral Oral  SpO2:  95% 98% 98%  Weight: 59 kg     Height: 5' 5 (1.651 m)       Intake/Output Summary (Last 24 hours) at 06/05/2024 0754 Last data filed at 06/05/2024 0500 Gross per 24 hour  Intake 1596.64 ml  Output 140 ml  Net 1456.64 ml   Filed Weights   06/04/24 1444  Weight: 59 kg    Examination:  General exam: Appears calm and comfortable , thin appearing Respiratory system: Bilateral rhonchus, respiratory effort normal. Cardiovascular system: S1 & S2 heard, RRR.  Gastrointestinal system: Abdomen is nondistended, soft and nontender. No organomegaly or masses felt. Normal bowel sounds heard. Central nervous system: Alert and oriented. No focal neurological deficits. Extremities: Symmetric 5 x 5 power.   Data Reviewed: I have personally reviewed following labs and imaging  studies  CBC: Recent Labs  Lab 06/04/24 1505 06/05/24 0443  WBC 11.3* 5.5  HGB 12.5* 10.0*  HCT 36.8* 29.6*  MCV 95.3 96.4  PLT 187 153   Basic Metabolic Panel: Recent Labs  Lab 06/04/24 1505 06/05/24 0443   NA 136 139  K 3.8 3.9  CL 99 105  CO2 25 25  GLUCOSE 157* 97  BUN 13 13  CREATININE 1.27* 1.06  CALCIUM 9.2 8.2*  MG  --  2.2   GFR: Estimated Creatinine Clearance: 43.3 mL/min (by C-G formula based on SCr of 1.06 mg/dL). Liver Function Tests: No results for input(s): AST, ALT, ALKPHOS, BILITOT, PROT, ALBUMIN in the last 168 hours. No results for input(s): LIPASE, AMYLASE in the last 168 hours. No results for input(s): AMMONIA in the last 168 hours. Coagulation Profile: No results for input(s): INR, PROTIME in the last 168 hours. Cardiac Enzymes: No results for input(s): CKTOTAL, CKMB, CKMBINDEX, TROPONINI in the last 168 hours. BNP (last 3 results) No results for input(s): PROBNP in the last 8760 hours. HbA1C: No results for input(s): HGBA1C in the last 72 hours. CBG: No results for input(s): GLUCAP in the last 168 hours. Lipid Profile: No results for input(s): CHOL, HDL, LDLCALC, TRIG, CHOLHDL, LDLDIRECT in the last 72 hours. Thyroid Function Tests: Recent Labs    06/05/24 0443  TSH 1.370   Anemia Panel: No results for input(s): VITAMINB12, FOLATE, FERRITIN, TIBC, IRON, RETICCTPCT in the last 72 hours. Sepsis Labs: Recent Labs  Lab 06/04/24 1700  LATICACIDVEN 1.2    Recent Results (from the past 240 hours)  Culture, blood (routine x 2)     Status: None (Preliminary result)   Collection Time: 06/04/24  4:48 PM   Specimen: BLOOD RIGHT ARM  Result Value Ref Range Status   Specimen Description   Final    BLOOD RIGHT ARM Performed at [facility], [address] [place], [place] [address]    Special Requests   Final    BOTTLES DRAWN AEROBIC AND ANAEROBIC Blood Culture adequate volume Performed at [facility], [address] [place], [place] [address]    Culture PENDING  Incomplete   Report Status PENDING  Incomplete  Resp panel by RT-PCR (RSV, Flu A&B, Covid) Anterior Nasal  Swab     Status: None   Collection Time: 06/04/24  5:07 PM   Specimen: Anterior Nasal Swab  Result Value Ref Range Status   SARS Coronavirus 2 by RT PCR NEGATIVE NEGATIVE Final    Comment: (NOTE) SARS-CoV-2 target nucleic acids are NOT DETECTED.  The SARS-CoV-2 RNA is generally detectable in upper respiratory specimens during the acute phase of infection. The lowest concentration of SARS-CoV-2 viral copies this assay can detect is 138 copies/mL. A negative result does not preclude SARS-Cov-2 infection and should not be used as the sole basis for treatment or other patient management decisions. A negative result may occur with  improper specimen collection/handling, submission of specimen other than nasopharyngeal swab, presence of viral mutation(s) within the areas targeted by this assay, and inadequate number of viral copies(<138 copies/mL). A negative result must be combined with clinical observations, patient history, and epidemiological information. The expected result is Negative.  Fact Sheet for Patients:  bloggercourse.com  Fact Sheet for Healthcare Providers:  seriousbroker.it  This test is no t yet approved or cleared by the [place]  FDA and  has been authorized for detection and/or diagnosis of SARS-CoV-2 by FDA under an Emergency Use Authorization (EUA).  This EUA will remain  in effect (meaning this test can be used) for the duration of the COVID-19 declaration under Section 564(b)(1) of the Act, 21 U.S.C.section 360bbb-3(b)(1), unless the authorization is terminated  or revoked sooner.       Influenza A by PCR NEGATIVE NEGATIVE Final   Influenza B by PCR NEGATIVE NEGATIVE Final    Comment: (NOTE) The Xpert Xpress SARS-CoV-2/FLU/RSV plus assay is intended as an aid in the diagnosis of influenza from Nasopharyngeal swab specimens and should not be used as a sole basis for treatment. Nasal washings and aspirates  are unacceptable for Xpert Xpress SARS-CoV-2/FLU/RSV testing.  Fact Sheet for Patients: bloggercourse.com  Fact Sheet for Healthcare Providers: seriousbroker.it  This test is not yet approved or cleared by the [place]  FDA and has been authorized for detection and/or diagnosis of SARS-CoV-2 by FDA under an Emergency Use Authorization (EUA). This EUA will remain in effect (meaning this test can be used) for the duration of the COVID-19 declaration under Section 564(b)(1) of the Act, 21 U.S.C. section 360bbb-3(b)(1), unless the authorization is terminated or revoked.     Resp Syncytial Virus by PCR NEGATIVE NEGATIVE Final    Comment: (NOTE) Fact Sheet for Patients: bloggercourse.com  Fact Sheet for Healthcare Providers: seriousbroker.it  This test is not yet approved or cleared by the [place]  FDA and has been authorized for detection and/or diagnosis of SARS-CoV-2 by FDA under an Emergency Use Authorization (EUA). This EUA will remain in effect (meaning this test can be used) for the duration of the COVID-19 declaration under Section 564(b)(1) of the Act, 21 U.S.C. section 360bbb-3(b)(1), unless the authorization is terminated or revoked.  Performed at [facility], [address], [place], [place] [address]          Radiology Studies: CT Chest W Contrast Result Date: 06/04/2024 CLINICAL DATA:  Pneumonia, complication suspected, xray done EXAM: CT CHEST WITH CONTRAST TECHNIQUE: Multidetector CT imaging of the chest was performed during intravenous contrast administration. RADIATION DOSE REDUCTION: This exam was performed according to the departmental dose-optimization program which includes automated exposure control, adjustment of the mA and/or kV according to patient size and/or use of iterative reconstruction technique. CONTRAST:  75mL  OMNIPAQUE  IOHEXOL  300 MG/ML  SOLN COMPARISON:  None Available. FINDINGS: Cardiovascular: Normal cardiac size. No pericardial effusion. No aortic aneurysm. Mediastinum/Nodes: Visualized thyroid gland appears grossly unremarkable. No solid / cystic mediastinal masses. The esophagus is nondistended precluding optimal assessment. There are mildly prominent bilateral hilar lymph nodes which are nonspecific. No mediastinal or axillary lymphadenopathy by size criteria. Lungs/Pleura: The central tracheo-bronchial tree is patent. There is mild, smooth, circumferential thickening of the segmental and subsegmental bronchial walls, throughout bilateral lungs, which is nonspecific. Findings are most commonly seen with bronchitis or reactive airway disease, such as asthma. There are heterogeneous opacities in the bilateral lower lobes without significant volume loss, favoring multilobar pneumonia. Follow-up to clearing is recommended. No pleural effusion or pneumothorax. Evaluation for discrete lung nodule is limited due to background lung parenchymal changes. There is a subcentimeter sized noncalcified opacity in the right major fissure, favored to represent intra fissural lymph node. Upper Abdomen: There are several simple cysts in the right kidney with largest arising from the upper pole, posteriorly measuring up to 5.6 x 7.0 cm. There is contracted gallbladder containing small volume calcified gallstones. There is diffuse thickening of bilateral adrenal glands, without discrete nodule. Findings are nonspecific but mostly associated with adrenal hyperplasia. Remaining visualized upper abdominal viscera  within normal limits. Post hernia repair changes noted in the epigastric region. Musculoskeletal: A CT Port-a-Cath is seen in the right upper chest wall with the catheter terminating in the cavo-atrial junction region. Visualized soft tissues of the chest wall are otherwise grossly unremarkable. No suspicious osseous lesions.  There are mild to moderate multilevel degenerative changes in the visualized spine. IMPRESSION: 1. There are heterogeneous opacities in the bilateral lower lobes without significant volume loss, favoring multilobar pneumonia. Follow-up to clearing is recommended. 2. Multiple other nonacute observations, as described above. Aortic Atherosclerosis (ICD10-I70.0). Electronically Signed   By: Ree Molt M.D.   On: 06/04/2024 17:48   DG Chest 2 View Result Date: 06/04/2024 CLINICAL DATA:  Shortness of breath EXAM: CHEST - 2 VIEW COMPARISON:  09/18/2021, PET CT 11/28/2023 FINDINGS: Right-sided central venous port tip at the cavoatrial junction. Hypoventilatory changes with mild basilar atelectasis on the left. Chronic elevation of left diaphragm. Probable bandlike atelectasis in the right hilar area. Stable cardiomediastinal silhouette. No pleural effusion or pneumothorax. Evidence of prior hernia repair. IMPRESSION: Hypoventilatory changes with left basilar atelectasis and probable platelike atelectasis in the right hilar area. Electronically Signed   By: Luke Bun M.D.   On: 06/04/2024 16:02        Scheduled Meds:  benzonatate   200 mg Oral TID   heparin   5,000 Units Subcutaneous Q8H   lactose free nutrition  237 mL Oral TID WC   levothyroxine   50 mcg Oral QAC breakfast   sodium chloride  flush  3 mL Intravenous Q12H   Continuous Infusions:  azithromycin      cefTRIAXone  (ROCEPHIN )  IV       LOS: 0 days    Time spent: 35 Minutes    Geoffrey Mankin A Shantina Chronister, MD [facility]   If 7PM-7AM, please contact night-coverage [email or web address]  06/05/2024, 7:54 AM   "

## 2024-06-05 NOTE — Plan of Care (Signed)
 " Hospital at Home Interim Note   Kenneth Sanford   FMW:[id number]  DOB: Dec 07, 1940  DOA: 06/04/2024     0 Date of Service: 06/05/2024   Brief Narrative: 83 year old with past medical history significant for hypothyroidism, unintentional weight loss, bladder cancer who completed chemoradiation 03/2024 presented with severe cough since Thanksgiving.  Reports having some diarrhea intermittently.  He was seen in the [place], chest x-ray was obtained and patient was sent home.  She was called back today to present to the ED for further evaluation because the x-ray showed bilateral pneumonia.   Patient on presentation was tachypneic, respiration rate 20s, blood pressure 107/70.  COVID flu RSV negative.  Mild elevated creatinine.  Strep pneumonia antigen positive.  Overall work of breathing improved. Both patient and daughters (over the phone) are in agreement with the hospital at home program    Subjective:  Overall work of breathing improved. Both patient and daughters (over the phone) are in agreement with the hospital at home program Hospital Problems Assessment and Plan: No notes have been filed under this hospital service. Service: Hospitalist 1-Strep Pneumonia, Multilobar pneumonia -Patient presents with cough, shortness of breath, leukocytosis white blood cell 11.3, CT chest:heterogeneous opacities in the bilateral lower lobes without significant volume loss, favoring multilobar pneumonia.  Influenza COVID RSV negative - Strep pneumonia antigen positive - On IV rocephin  and azithromycin  (day 2/5)  - Guaifenesin  twice daily - Nebulizer as needed -Add on Incentive spirometry    2-Hypothyroidism: - Continue with Synthroid    3-Unintentional weight loss, protein caloric malnutrition: -felt to be secondary to recent illnesses including Covid 04/2024 per family  - Continue Ensure -check prealbumin  -Dietary consult  -Pt noted to have eaten full breakfast    4-History of bladder cancer: -  Patient completed chemotherapy and radiation 2 months ago.  He needs follow-up in January for further evaluation     5-Anemia: -Monitor  hemoglobin -12.5-11  -Suspect hemoconcentration on admission. -trend  6-CKD stage III A - Previous creatinine 1.2-- -stable      Objective Vital signs were reviewed and unremarkable. Vitals:   06/04/24 1612 06/04/24 2145 06/05/24 0159 06/05/24 0938  BP: 107/70 106/71 106/78 116/72  Pulse: 97 94 85 66  Temp: 98.7 F (37.1 C) 98.2 F (36.8 C) 97.8 F (36.6 C) 98.3 F (36.8 C)  Resp: (!) 23 (!) 21 18 16   Height:      Weight:      SpO2: 95% 98% 98% 97%  TempSrc: Oral Oral Oral Oral  BMI (Calculated):        Exam Physical Exam HENT:     Head: Normocephalic.     Nose: Nose normal.     Mouth/Throat:     Mouth: Mucous membranes are moist.  Eyes:     Pupils: Pupils are equal, round, and reactive to light.  Pulmonary:     Effort: Pulmonary effort is normal.  Abdominal:     General: Bowel sounds are normal.  Musculoskeletal:        General: Normal range of motion.     Cervical back: Normal range of motion.  Skin:    General: Skin is warm.  Neurological:     General: No focal deficit present.     Mental Status: He is alert.      Labs / Other Information There are no new results to review at this time. CT Chest W Contrast CLINICAL DATA:  Pneumonia, complication suspected, xray done  EXAM: CT CHEST WITH CONTRAST  TECHNIQUE: Multidetector CT imaging of the chest was performed during intravenous contrast administration.  RADIATION DOSE REDUCTION: This exam was performed according to the departmental dose-optimization program which includes automated exposure control, adjustment of the mA and/or kV according to patient size and/or use of iterative reconstruction technique.  CONTRAST:  75mL OMNIPAQUE  IOHEXOL  300 MG/ML  SOLN  COMPARISON:  None Available.  FINDINGS: Cardiovascular: Normal cardiac size. No pericardial  effusion. No aortic aneurysm.  Mediastinum/Nodes: Visualized thyroid gland appears grossly unremarkable. No solid / cystic mediastinal masses. The esophagus is nondistended precluding optimal assessment. There are mildly prominent bilateral hilar lymph nodes which are nonspecific. No mediastinal or axillary lymphadenopathy by size criteria.  Lungs/Pleura: The central tracheo-bronchial tree is patent. There is mild, smooth, circumferential thickening of the segmental and subsegmental bronchial walls, throughout bilateral lungs, which is nonspecific. Findings are most commonly seen with bronchitis or reactive airway disease, such as asthma. There are heterogeneous opacities in the bilateral lower lobes without significant volume loss, favoring multilobar pneumonia. Follow-up to clearing is recommended. No pleural effusion or pneumothorax. Evaluation for discrete lung nodule is limited due to background lung parenchymal changes. There is a subcentimeter sized noncalcified opacity in the right major fissure, favored to represent intra fissural lymph node.  Upper Abdomen: There are several simple cysts in the right kidney with largest arising from the upper pole, posteriorly measuring up to 5.6 x 7.0 cm. There is contracted gallbladder containing small volume calcified gallstones. There is diffuse thickening of bilateral adrenal glands, without discrete nodule. Findings are nonspecific but mostly associated with adrenal hyperplasia. Remaining visualized upper abdominal viscera within normal limits. Post hernia repair changes noted in the epigastric region.  Musculoskeletal: A CT Port-a-Cath is seen in the right upper chest wall with the catheter terminating in the cavo-atrial junction region. Visualized soft tissues of the chest wall are otherwise grossly unremarkable. No suspicious osseous lesions. There are mild to moderate multilevel degenerative changes in the visualized  spine.  IMPRESSION: 1. There are heterogeneous opacities in the bilateral lower lobes without significant volume loss, favoring multilobar pneumonia. Follow-up to clearing is recommended. 2. Multiple other nonacute observations, as described above.  Aortic Atherosclerosis (ICD10-I70.0).  Electronically Signed   By: Ree Molt M.D.   On: 06/04/2024 17:48 DG Chest 2 View CLINICAL DATA:  Shortness of breath  EXAM: CHEST - 2 VIEW  COMPARISON:  09/18/2021, PET CT 11/28/2023  FINDINGS: Right-sided central venous port tip at the cavoatrial junction. Hypoventilatory changes with mild basilar atelectasis on the left. Chronic elevation of left diaphragm. Probable bandlike atelectasis in the right hilar area. Stable cardiomediastinal silhouette. No pleural effusion or pneumothorax. Evidence of prior hernia repair.  IMPRESSION: Hypoventilatory changes with left basilar atelectasis and probable platelike atelectasis in the right hilar area.  Electronically Signed   By: Luke Bun M.D.   On: 06/04/2024 16:02  Lab Results  Component Value Date   WBC 5.5 06/05/2024   HGB 10.0 (L) 06/05/2024   HCT 29.6 (L) 06/05/2024   MCV 96.4 06/05/2024   PLT 153 06/05/2024   Last metabolic panel Lab Results  Component Value Date   GLUCOSE 97 06/05/2024   NA 139 06/05/2024   K 3.9 06/05/2024   CL 105 06/05/2024   CO2 25 06/05/2024   BUN 13 06/05/2024   CREATININE 1.06 06/05/2024   GFRNONAA >60 06/05/2024   CALCIUM 8.2 (L) 06/05/2024   PROT 6.7 05/10/2024   ALBUMIN 3.9 05/10/2024   BILITOT 0.5 05/10/2024   ALKPHOS 60  05/10/2024   AST 20 05/10/2024   ALT 11 05/10/2024   ANIONGAP 9 06/05/2024     Hospital at Home Admission Criteria Checklist:  Formal consent explained in detail and signed at the bedside: yes Patient meets inpatient admission criteria (see below for further details) yes Is pt Medicare FFS/[employer] Medicare-Medicaid, [employer], [employer] Medicare, [employer]vantage, [employer] ( required for initial launch with plan to expand)? yes Lives within 25 mil/ 30 min from [facility] within [place](pt may stay with family member during admission who lives within 25 miles or 30 min from [facility] w/in [facility])? yes Hemodynamically stable with relatively low risk of clinical deterioration-not requiring ICU? yes Age >55? Yes Does not require frequent touch-points or complex interventions/medications (ie Titrated Infusions (IV insulin, heparin  drips, vasoactive drips, use of infused or injectable controlled substances, patients on insulin)? no Any Behavioral Health comorbidities likely to increase risk for in-home care (ie Acute delirium or experiencing a marked altered mental status and cause is not a treatable condition in the home)? no Has the patient been on BIPAP during course of ED evaluation or hospitalization? no IF YES, Has the patient been off of BIPAP for >24 hours(If NO-THEN PATIENT DOES MEET INCLUSION CRITERIA)? no On Room Air or Needs oxygen at home (<6L)? is not on home oxygen therapy. Active safety concerns (ie Unable to use bedside commode independently and lacks caregiver support for safety- needs SNF placement, unable to obtain IV access)? no Has skin check been performed? no -pending  Has Physical Therapy screened the patient? yes  Common admission diagnoses including: CAP, COPD Exacerbation, Acute on chronic heart failure, Cellulitis, UTI , dehydration, acute resp failure with hypoxia (requiring <5L)   Social Screening:  - Has the family been directly contacted about Hospital at Home program with consent obtained (if yes- please document who was spoken to with name and phone number)? yes  -Was the family approached about the use of TOC pharmacy for medications at discharge? no Denies significant ETOH intake? not applicable Does not smoke and understands may not smoke in the presence of oxygen? not applicable Patient states able to use  iPad/phone for communication/has family who is able to use? yes Patient has agreed to be compliant with medication and treatment regimen of the program? yes Any active drug use in patient or primary caregiver including daily dosing of methadone? no Stable home environment ( access to appropriate heating in cold conditions and/or appropriate air conditioning in hot conditions and/or no running water/electricity)? yes  No aggressive pets at home? no Firearm present? no  With ability or willingness to store them unloaded in a locked case for duration of hospitalization? no Ambulatory? yes  mild difficulty Bed bugs present on home evaluation? no Family support system in place? yes Patient feels safe at home and does not endorse any violence? yes Any actively decompensated behavioral health issues including agitation/aggressive behavior? no  Patient requests food to be provided by hospital home program? no PT/OT eval completed and not requiring SNF, ALF, inpatient rehab? yes  To be admitted to the [facility] program, a patient generally must meet the following: 1. Requirement for Inpatient Level of Care: The patient's condition must necessitate an inpatient level of care. This is typically indicated by one or more of the following, depending on their specific diagnosis:  Persistent tachycardia despite appropriate treatment (e.g., for Heart Failure, UTI). Persistent tachypnea (rapid breathing) or dyspnea (shortness of breath) that hasn't improved sufficiently with observation care (e.g.,  for Heart Failure, Pneumonia, Viral Illness, COVID). Hypoxemia (low oxygen levels), such as a new need for oxygen, an increased need from baseline, or specific oxygen saturation levels (e.g., SpO2 <90-94% depending on the condition) that persist despite observation (e.g., for Heart Failure, COPD, Pneumonia, Viral Illness, COVID). Need for Intravenous (IV) hydration due to an inability to maintain oral hydration,  which persists despite observation care (e.g., for Cellulitis, UTI, Viral Illness, COVID). Specific to Heart Failure: Persistent pulmonary edema, indicated by a new oxygen need, lack of improvement with IV diuretics, and ongoing tachypnea/dyspnea. Specific to COPD: A decrease in known baseline resting oxygen saturation (SpO2) by 4% or more, or an increase in pre-existing supplemental oxygen requirements, which persists despite observation and requires continued close monitoring. Specific to Pneumonia: A Pneumonia Severity Index (PSI) class IV (moderate risk). Specific to Cellulitis: Failure of outpatient antibiotic therapy (indicated by progression or no improvement after a minimum of 48 hours on an adequate regimen) or a clinical presentation (like acuity or rapidity of progression) that requires the intensity of monitoring found in an inpatient setting. Specific to UTI: Persistence or worsening of clinical findings like fever, pain, or dehydration despite observation care; presence of significant uropathy; suspected infection of an indwelling prosthetic device, stent, implant, or graft; or pregnancy with suspected pyelonephritis.  2. Appropriateness for Hospital at Home Setting: The patient's overall clinical picture, including the severity of their illness, their care needs, and their medical history and comorbidities, must be suitable for management in the Hospital at Home environment. This essentially means that none of the exclusion criteria (listed below) are met.  Unified Exclusion Criteria for Hospital at Home Admission: A patient would not be eligible for Hospital at Home if any of the following are present: Hemodynamic Instability: Hypotension (low blood pressure) is present. Respiratory Instability or Needs Beyond Program Capability: There is a new need for invasive or noninvasive ventilatory assistance (like BiPAP or a ventilator). Oxygenation is not sufficient, generally indicated if  an FiO2 (fraction of inspired oxygen) of 45% (which is about 6 Liters/minute via nasal cannula) or more is required to keep oxygen saturation (SpO2) at 90% or greater. Monitoring or Procedural Needs Beyond Program Capability: There is a need for invasive monitoring, such as a pulmonary artery catheter or an arterial line. There is a need for immediate-response telemetry monitoring (for dangerous arrhythmia detection and subsequent immediate intervention). The required medication regimen is beyond the capabilities of Hospital at Home (e.g., dosing intervals are too frequent for home administration). There is a need for a procedure that cannot be performed by the [facility] at [facility] team (e.g., significant wound debridement or abscess drainage for cellulitis, or percutaneous nephrostomy for a complicated UTI). Significant Organ Dysfunction or Markers of Severe Illness: Mental status is not at baseline, or there is altered mental status suggestive of inadequate perfusion. Renal (kidney) function is unstable or showing an ongoing decline. There is evidence of inadequate perfusion, such as metabolic acidosis or myocardial ischemia. Uncompensated acidosis is present. Condition-Specific Severity or Complications Making Home Care Unsuitable: For Heart Failure: Known severe cardiac valvular disease (e.g., aortic stenosis, mitral regurgitation); or severe peripheral edema that impairs the ability to urinate or ambulate. For COPD: Known concurrent comorbidity or finding that indicates a higher-risk COPD exacerbation (e.g., pulmonary fibrosis, cavitation, pleural effusion, pneumothorax, rib fracture). For Pneumonia: Pneumonia Severity Index (PSI) class V (indicating high risk for inpatient mortality); known concurrent comorbidity or finding that indicates higher-risk pneumonia (e.g., pulmonary fibrosis, cavitation, large or loculated pleural  effusion); or a concomitant serious infectious process like endocarditis or  empyema. For Cellulitis: Orbital, periorbital, or necrotizing infection is suspected; or a concomitant serious infectious process like endocarditis, septic emboli, or septic joint space infection. For UTI: Urinary tract obstruction (e.g., kidney stone, bladder outlet obstruction); or a concomitant serious infectious process like endocarditis or septic emboli. For Viral Illness & COVID-19: A concomitant serious infectious process like endocarditis or empyema.  General Comorbidities or Status:  The patient is significantly immunosuppressed (this applies to Pneumonia, Cellulitis, UTI, Viral Illness, and COVID-19). The patient meets inpatient admission criteria for a second diagnosis, or has care needs beyond the capabilities of Hospital at Home due to an active clinically significant comorbidity. (This is a general exclusion across all listed conditions)  Time spent: >60 minutes [facility] 06/05/2024, 3:48 PM   "

## 2024-06-05 NOTE — Evaluation (Signed)
 Physical Therapy Evaluation Patient Details Name: Kenneth Sanford MRN: [id number] DOB: 1940-10-20 Today's Date: 06/05/2024  History of Present Illness  Pt is 83 yo male admitted on 06/04/24 with bil PNE in immunosuppressed 83 yo. Pt with mild hypotension and diarrhea. Pt with hx of hypothyroidism, unintentional weight loss, bladder CA who recently completed chemo and radiation 10/25.  Clinical Impression  Pt admitted with above diagnosis. At baseline, pt lives in accessible home with near 24 hr family support.  He is generally fairly independent and ambulates with cane at times.  Does report at least 2 recent falls due to syncopal symptoms.  Today, pt did have mild hypotension with slight drop in BP with activity (see below).  He reported mild lightheaded with initial sit that resolved but returned with ambulation - again mild and able to continue mobility.  Pt overall CGA to supervision level for mobility and ambulated 100'.  Appears to be minimally below baseline and will benefit from acute PT and potential HHPT.  Pt currently with functional limitations due to the deficits listed below (see PT Problem List). Pt will benefit from acute skilled PT to increase their independence and safety with mobility to allow discharge.       Vitals as follows: Pt reports mild lightheadedness with initial sit and with walking.  Supine: 120/79 with HR 66 Sitting: 104/80 with HR 89 Standing: 115/78 with HR 92 Did not get 3 min as pt had to use bathroom.  In bathroom extended time, feeling ok, no lightheaded, left pt with instruction to use call bell, PT returned later to pt sitting on EOB. EOB BP 112/80 with HR 80 Post walk 111/87 with HR 112 bpm.  O2 sats >95% on RA.    If plan is discharge home, recommend the following: A little help with walking and/or transfers;A little help with bathing/dressing/bathroom;Assistance with cooking/housework;Help with stairs or ramp for entrance   Can travel by private  vehicle        Equipment Recommendations None recommended by PT  Recommendations for Other Services       Functional Status Assessment Patient has had a recent decline in their functional status and demonstrates the ability to make significant improvements in function in a reasonable and predictable amount of time.     Precautions / Restrictions Precautions Precautions: Fall      Mobility  Bed Mobility Overal bed mobility: Needs Assistance Bed Mobility: Supine to Sit, Sit to Supine     Supine to sit: Supervision Sit to supine: Supervision        Transfers Overall transfer level: Needs assistance Equipment used: None, Rolling walker (2 wheels) Transfers: Sit to/from Stand Sit to Stand: Supervision, Contact guard assist           General transfer comment: STS x 3 - initial with CGA progressed to close supervision    Ambulation/Gait Ambulation/Gait assistance: Supervision Gait Distance (Feet): 100 Feet Assistive device: Rolling walker (2 wheels), None Gait Pattern/deviations: Step-through pattern Gait velocity: decreased but functional     General Gait Details: No AD for 10' in room to bathroom (completed ADLS independentlly); ambulated 100' in hallway with RW; steady no LOB  Stairs            Wheelchair Mobility     Tilt Bed    Modified Rankin (Stroke Patients Only)       Balance Overall balance assessment: Needs assistance Sitting-balance support: No upper extremity supported Sitting balance-Leahy Scale: Good     Standing balance  support: No upper extremity supported Standing balance-Leahy Scale: Good                               Pertinent Vitals/Pain Pain Assessment Pain Assessment: No/denies pain    Home Living Family/patient expects to be discharged to:: Private residence Living Arrangements: Other (Comment) (granddaughter and 2 great grandsons (children)) Available Help at Discharge: Family;Available 24 hours/day  (granddaughter there most of the time; wife is in memory care) Type of Home: House Home Access: Stairs to enter Entrance Stairs-Rails: None Entrance Stairs-Number of Steps: 2   Home Layout: Multi-level;Able to live on main level with bedroom/bathroom Home Equipment: Cane - single point;Rolling Walker (2 wheels)      Prior Function Prior Level of Function : Independent/Modified Independent             Mobility Comments: Ambulates with cane when he goes out and sometimes in house. Last fall within the week, 1 other fall this year - reports falls from lightheaded/syncope ADLs Comments: independent with adls; does light iadls only     Extremity/Trunk Assessment   Upper Extremity Assessment Upper Extremity Assessment: Overall WFL for tasks assessed    Lower Extremity Assessment Lower Extremity Assessment: Overall WFL for tasks assessed (ROM WFL, does have genu varus (reports arthritis bone on bone); MMT grossly 5/5)    Cervical / Trunk Assessment Cervical / Trunk Assessment: Normal  Communication        Cognition Arousal: Alert Behavior During Therapy: WFL for tasks assessed/performed   PT - Cognitive impairments: No apparent impairments                                 Cueing       General Comments General comments (skin integrity, edema, etc.): Pt reports a few recent falls due to syncopal symptoms.  Noting that pt has had recent diarrhea and potentially volume depleted per MD notes.  Vitals as follows: Pt reports mild lightheadedness with initial sit and with walking.  Supine: 120/79 with HR 66 Sitting: 104/80 with HR 89 Standing: 115/78 with HR 92 Did not get 3 min as pt had to use bathroom.  In bathroom extended time, feeling ok, no lightheaded, left pt with instruction to use call bell, PT returned later to pt sitting on EOB. EOB BP 112/80 with HR 80 Post walk 111/87 with HR 112 bpm.  O2 sats >95% on RA.    Exercises     Assessment/Plan     PT Assessment Patient needs continued PT services  PT Problem List Decreased strength;Decreased range of motion;Decreased activity tolerance;Decreased balance;Decreased mobility;Cardiopulmonary status limiting activity;Decreased knowledge of use of DME       PT Treatment Interventions DME instruction;Therapeutic exercise;Gait training;Stair training;Functional mobility training;Therapeutic activities;Patient/family education;Balance training    PT Goals (Current goals can be found in the Care Plan section)  Acute Rehab PT Goals Patient Stated Goal: return home PT Goal Formulation: With patient Time For Goal Achievement: 06/19/24 Potential to Achieve Goals: Good    Frequency Min 2X/week     Co-evaluation               AM-PAC PT 6 Clicks Mobility  Outcome Measure Help needed turning from your back to your side while in a flat bed without using bedrails?: A Little Help needed moving from lying on your back to sitting on the side of  a flat bed without using bedrails?: A Little Help needed moving to and from a bed to a chair (including a wheelchair)?: A Little Help needed standing up from a chair using your arms (e.g., wheelchair or bedside chair)?: A Little Help needed to walk in hospital room?: A Little Help needed climbing 3-5 steps with a railing? : A Little 6 Click Score: 18    End of Session Equipment Utilized During Treatment: Gait belt Activity Tolerance: Patient tolerated treatment well Patient left: in bed;with call bell/phone within reach;with bed alarm set Nurse Communication: Mobility status PT Visit Diagnosis: Other abnormalities of gait and mobility (R26.89);History of falling (Z91.81)    Time: [phone] (Out of room 27 mins as pt in bathoom; 43 min PT session) PT Time Calculation (min) (ACUTE ONLY): 70 min   Charges:   PT Evaluation $PT Eval Low Complexity: 1 Low PT Treatments $Gait Training: 8-22 mins $Therapeutic Activity: 8-22 mins PT General  Charges $$ ACUTE PT VISIT: 1 Visit         Benjiman, PT Acute Rehab Services [facility] [phone]   Benjiman VEAR Mulberry 06/05/2024, 11:02 AM

## 2024-06-06 MED ORDER — AZITHROMYCIN 500 MG PO TABS
500.0000 mg | ORAL_TABLET | Freq: Every day | ORAL | Status: AC
  Administered 2024-06-06 – 2024-06-08 (×3): 500 mg via ORAL
  Filled 2024-06-06 (×4): qty 2
  Filled 2024-06-06: qty 1

## 2024-06-06 MED ORDER — OXYBUTYNIN CHLORIDE 5 MG PO TABS
5.0000 mg | ORAL_TABLET | Freq: Three times a day (TID) | ORAL | Status: DC | PRN
  Administered 2024-06-07: 5 mg via ORAL
  Filled 2024-06-06: qty 3
  Filled 2024-06-06 (×2): qty 1

## 2024-06-06 MED ORDER — VITAMIN D 25 MCG (1000 UNIT) PO TABS
1000.0000 [IU] | ORAL_TABLET | Freq: Every day | ORAL | Status: DC
  Administered 2024-06-06 – 2024-06-08 (×3): 1000 [IU] via ORAL
  Filled 2024-06-06 (×4): qty 1

## 2024-06-06 NOTE — Progress Notes (Signed)
 This clinical research associate arrived at [facility] [place], stopped at the nurses station, spoke with nurse, received patients shadow chart. This writer went to room 1523, found patient lying in bed, asleep. Patient easily aroused to verbal stimuli. The nurse came into the room to give patient medication. This clinical research associate introduced herself to the patient. This clinical research associate spoke with Acupuncturist, Shanda, we conducted pre-admit pause and Dr. Eldonna was at bedside. Patient is A&O x4, was able to tell me his address, phone number, date of birth and emergency contact. The patients vital signs: T-97.6, P-90, R-18, BP-127/79 and SPO2-98% on room air.  This writer instructed patient that we needed to get dressed to head home. Patient is fully ambulatory, he does have a cane with him from home. This clinical research associate assisted patient with getting dressed, 20 gauge IV access in Left AC. This clinical research associate gathered patients belongings, keys, cane, cell phone and toiletries. Patient sat in this writers wheelchair. Patient was pushed to wheelchair van and loaded. Patient was secured via 2 front floor hooks, 2 rear floor hooks, 2 wheelchair locks and lap/shoulder seatbelt. Patient was transported home.  Patient was able to tell me directions along with GPS. Patient arrived at home. Patient was unloaded via wheelchair, patient was able to walk up 2 steps and walk into his house with no issues. Patient showed this Chief Financial Officer, Steffan where his bedroom was located. Patients house looked clean, free from clutter and looks safe for the patient to be ambulatory in. Patient went to the restroom. This Chief Financial Officer, Patent examiner [employer] at [employer]. This clinical research associate took patients vital signs, noted in EPIC under vital signs tab. This clinical research associate spoke with Acupuncturist, Shanda, verified she could see them. This writer left the patients house. Paramedic, Steffan was in the house finishing up admission.  ----------------------------------------------------------------------------------------------------- Kenneth Sanford. Franchot, EMT

## 2024-06-06 NOTE — Plan of Care (Signed)

## 2024-06-06 NOTE — Progress Notes (Signed)
 This medic arrived on at Brase's residence and helped unload patient from wheelchair van. Patient able to walk into home with minimal assistance. Pt showed me his bedroom and it appeared safe for patient to ambulate in, free of clutter. Patient urinated while medic performed med reconciliation and set up equipment.  Patient had a significant amount of medications that were prescribed and not opened (in original pharmacy mailing packets) or stopped, or obtained over the counter. Medicines placed in tamper-proof bags and placed under the vanity in patients room. Findings shared with virtual nurse and visit completed.   Patient assessment performed as noted.   Initial IV began leaking saline when flushed prior to administering IV fluid. New IV placed as noted in LDA and infusion continued. Wrapped and secured, capped with curos cap after infusion.   Policies reviewed with Pt, and pt stated he would like Tax Inspector to also speak with daughter when available.   -Steffan Daring EMT-P

## 2024-06-06 NOTE — Progress Notes (Signed)
" °  Progress Note   Patient: Kenneth Sanford FMW:[id number] DOB: 1940-10-15 DOA: 06/04/2024     1 DOS: the patient was seen and examined on 06/06/2024   Brief hospital course: [name] is an 83 year old male with history of hypothyroid, bladder cancer status post chemotherapy and radiation completion in 10/25, intermittent diarrhea, recent COVID-19 infection, unintentional weight loss.  06/04/2024: He presents to the ED for chief concerns of severe cough since Thanksgiving.  12/19: Patient admitted to [facility] hospitalist service for bilateral pneumonia in immunosuppressed patient.  First dose antibiotic initiated on 12/19: Azithromycin  500 mg IV (at 2116-hour), ceftriaxone  1 g IV (1812-hour)  12/20: Patient transferred to hospital at home care.  Overnight, pt asked to stay in hospital to which the [facility] team obliged.   Assessment and Plan: 1-Strep Pneumonia, Multilobar pneumonia -Patient presents with cough, shortness of breath, leukocytosis white blood cell 11.3, CT chest:heterogeneous opacities in the bilateral lower lobes without significant volume loss, favoring multilobar pneumonia.  Influenza COVID RSV negative - Strep pneumonia antigen positive - On IV rocephin  and azithromycin  (day 3/5)  - Guaifenesin  twice daily - Nebulizer as needed -Add on Incentive spirometry    2-Hypothyroidism: - Continue with Synthroid    3-Unintentional weight loss, protein caloric malnutrition: -felt to be secondary to recent illnesses including Covid 04/2024 per family  - Continue Ensure -check prealbumin  -Dietary consult  -Pt noted to have eaten full breakfast    4-History of bladder cancer: - Patient completed chemotherapy and radiation 2 months ago.  He needs follow-up in January for further evaluation     5-Anemia: -Monitor  hemoglobin -12.5-11  -Suspect hemoconcentration on admission. -trend   6-CKD stage III A - Previous creatinine 1.2-- -stable      Subjective: SOB is  improving. Still with cough/   Physical Exam: Vitals:   06/05/24 2027 06/05/24 2105 06/06/24 0447 06/06/24 1013  BP:  109/77 120/81 118/78  Pulse:  81 82 67  Resp: (!) 23 18 18 18   Temp:  97.8 F (36.6 C) 98.2 F (36.8 C) 97.7 F (36.5 C)  TempSrc:  Oral Oral Oral  SpO2:  99% 94% 95%  Weight:      Height:       Physical Exam Constitutional:      Appearance: He is normal weight.  HENT:     Head: Normocephalic and atraumatic.     Mouth/Throat:     Mouth: Mucous membranes are moist.  Eyes:     Pupils: Pupils are equal, round, and reactive to light.  Cardiovascular:     Rate and Rhythm: Normal rate and regular rhythm.  Pulmonary:     Effort: Pulmonary effort is normal.     Breath sounds: No wheezing.  Abdominal:     General: Bowel sounds are normal.     Data Reviewed:  There are no new results to review at this time.  Family Communication: Family made aware of planned transfer to [facility] this am  Disposition: Status is: Inpatient Remains inpatient appropriate because: PNA- PSI score 4  Planned Discharge Destination: Home    Time spent: >55 minutes  Author: Elspeth JINNY Masters, MD 06/06/2024 2:59 PM  For on call review [email or web address].  "

## 2024-06-06 NOTE — Plan of Care (Signed)
" °  Problem: Education: Goal: Knowledge of General Education information will improve Description: Including pain rating scale, medication(s)/side effects and non-pharmacologic comfort measures Outcome: Progressing   Problem: Health Behavior/Discharge Planning: Goal: Ability to manage health-related needs will improve Outcome: Progressing   Problem: Clinical Measurements: Goal: Ability to maintain clinical measurements within normal limits will improve Outcome: Progressing Goal: Will remain free from infection Outcome: Progressing Goal: Diagnostic test results will improve Outcome: Progressing Goal: Respiratory complications will improve Outcome: Progressing Goal: Cardiovascular complication will be avoided Outcome: Progressing   Problem: Clinical Measurements: Goal: Will remain free from infection Outcome: Progressing   Problem: Coping: Goal: Level of anxiety will decrease Outcome: Progressing   Problem: Elimination: Goal: Will not experience complications related to bowel motility Outcome: Progressing Goal: Will not experience complications related to urinary retention Outcome: Progressing   Problem: Nutrition: Goal: Adequate nutrition will be maintained Outcome: Progressing   Problem: Activity: Goal: Ability to tolerate increased activity will improve Outcome: Progressing   Problem: Skin Integrity: Goal: Risk for impaired skin integrity will decrease Outcome: Progressing   "

## 2024-06-06 NOTE — Progress Notes (Signed)
 Patient transferred to the [facility] program. Communicated with patient via video tablet. AAOx4. Denies pain and SOB. Room air. Plan of care reviewed with patient. Patient was told that the virtual nurse is available 24/7. [facility] phone number given to the patient. Tablet usage explained. Medication reconciliation and skin check completed with Steffan, paramedic. Patient agreeable with plan of care.

## 2024-06-06 NOTE — Progress Notes (Signed)
 Arrived to find the PT lying down in bed, no obvious distress or trauma noted. PT is alert and oriented to his baseline and in a jovial mood. PT did not have any complaints other than the consistent coughing.   Physical exam showed negative DCAPBTLS to the head, neck, or face. PERRLA. Negative JVD or tracheal deviation. Chest expansion is equal with non-labored breathing. L/S were noted to be diminished in lower lobes but clear in all other lobes. PT denied any CP or pressure. PT denied any rib or sternal pain. ABD is soft, non-tender in all quadrants. Negative peripheral or pedal edema noted.   All medications were administered as prescribed. PT was reminded that if he needed anything throughout the night to call the virtual RN.

## 2024-06-07 DIAGNOSIS — E039 Hypothyroidism, unspecified: Secondary | ICD-10-CM | POA: Insufficient documentation

## 2024-06-07 DIAGNOSIS — G4700 Insomnia, unspecified: Secondary | ICD-10-CM

## 2024-06-07 DIAGNOSIS — R918 Other nonspecific abnormal finding of lung field: Secondary | ICD-10-CM

## 2024-06-07 DIAGNOSIS — R059 Cough, unspecified: Secondary | ICD-10-CM

## 2024-06-07 DIAGNOSIS — E43 Unspecified severe protein-calorie malnutrition: Secondary | ICD-10-CM

## 2024-06-07 LAB — BASIC METABOLIC PANEL WITH GFR
Anion gap: 9 (ref 5–15)
BUN: 10 mg/dL (ref 8–23)
CO2: 26 mmol/L (ref 22–32)
Calcium: 8.4 mg/dL — ABNORMAL LOW (ref 8.9–10.3)
Chloride: 106 mmol/L (ref 98–111)
Creatinine, Ser: 1.25 mg/dL — ABNORMAL HIGH (ref 0.61–1.24)
GFR, Estimated: 57 mL/min — ABNORMAL LOW
Glucose, Bld: 100 mg/dL — ABNORMAL HIGH (ref 70–99)
Potassium: 3.7 mmol/L (ref 3.5–5.1)
Sodium: 140 mmol/L (ref 135–145)

## 2024-06-07 LAB — LEGIONELLA PNEUMOPHILA SEROGP 1 UR AG: L. pneumophila Serogp 1 Ur Ag: NEGATIVE

## 2024-06-07 MED ORDER — HYDROCOD POLI-CHLORPHE POLI ER 10-8 MG/5ML PO SUER: 5.0000 mL | Freq: Every evening | ORAL | 0 refills | Status: AC | PRN

## 2024-06-07 MED ORDER — HYDROCOD POLI-CHLORPHE POLI ER 10-8 MG/5ML PO SUER
5.0000 mL | Freq: Every evening | ORAL | Status: DC | PRN
  Administered 2024-06-07: 5 mL via ORAL
  Filled 2024-06-07 (×2): qty 5

## 2024-06-07 MED ORDER — HYDROCOD POLI-CHLORPHE POLI ER 10-8 MG/5ML PO SUER: 5.0000 mL | Freq: Every evening | ORAL | 0 refills | Status: DC | PRN

## 2024-06-07 MED ORDER — BOOST / RESOURCE BREEZE PO LIQD CUSTOM
1.0000 | ORAL | Status: DC
  Administered 2024-06-07: 1 via ORAL
  Filled 2024-06-07 (×2): qty 1

## 2024-06-07 MED ORDER — HYDROCOD POLI-CHLORPHE POLI ER 10-8 MG/5ML PO SUER
5.0000 mL | Freq: Two times a day (BID) | ORAL | Status: DC | PRN
  Filled 2024-06-07: qty 5

## 2024-06-07 NOTE — Progress Notes (Signed)
 Video call completed with patient, his granddaughter, and medic Delon who was in the home. Patient denies pain or any needs at this time. Plan made for family to reach out to nurse a little later tonight to complete bedtime medication administration. Family encouraged to call RN via tablet or phone if needs arise.

## 2024-06-07 NOTE — Progress Notes (Signed)
 2027-Contact via phone-RN introduction, patient identifiers, overnight monitoring, medication administration, plan and notification of Paramedic arrival completed.     2152-Video Call- Patient identifiers reviewed. Patient resting in bed, Medication administration assistance already completed by [facility] paramedic per patient. The patients granddaughter was a present. Patients denies pain or respiratory distress. No medication administration with RN oversight at this time. Patient confirmed all questions answered. Patient preparing for bed for the night. Overnight monitoring plan confirmed again. Patient and granddaughter encouraged to contact [facility] as needed.

## 2024-06-07 NOTE — Assessment & Plan Note (Signed)
 Continue outpatient follow-up with medical oncology as appropriate

## 2024-06-07 NOTE — Progress Notes (Signed)
 Patient reports coughing and urinating multiple times overnight. PRN guaifenesin  offered to patient is morning. Denies pain. Room air. Plan of care reviewed with patient and his granddaughter, Nestora.

## 2024-06-07 NOTE — Progress Notes (Signed)
 Initial Nutrition Assessment  DOCUMENTATION CODES:   Severe malnutrition in context of acute illness/injury  INTERVENTION:  Try Boost Breeze po once daily, each supplement provides 250 kcal and 9 grams of protein. If patient likes, can increase to 2-3 times per day. MVI with minerals daily, granddaughter to purchase over the counter. Suggestions for Increasing Calories and Protein handout added to discharge instructions.   NUTRITION DIAGNOSIS:  Severe Malnutrition related to acute illness (PNA) as evidenced by energy intake < or equal to 50% for > or equal to 5 days, percent weight loss (9% weight loss within the past 3 months).  GOAL:  Patient will meet greater than or equal to 90% of their needs  MONITOR:  PO intake, Supplement acceptance  REASON FOR ASSESSMENT:  Consult Assessment of nutrition requirement/status  ASSESSMENT:  83 yo male admitted with bilateral PNA. PMH includes hypothyroidism, bladder cancer - completed chemoradiation 03/2024, unintentional weight loss, leukemia.  Spoke with patient and his granddaughter over the phone. Patient has had a decreased appetite and decreased intake for the past month or so. He takes a vitamin D  supplement, but not a multivitamin. Granddaughter does a great job of offering foods and beverages that patient likes, but if he doesn't feel like eating, he doesn't eat. Her aunt (patient's daughter) gets takeout food for patient when granddaughter is not around to make him something to eat. We discussed ways to increase calorie and protein intake at home. Patient does not like the taste of Ensure and Boost supplements. He says he is lactose intolerant, but tolerates 2% milk with his cereal. He does not drink milk by itself. He does not eat yogurt, but loves cheese and ice cream. Granddaughter says that their blender just broke, so she is working on getting another one and she can make milkshakes for him. He agreed to try Boost Breeze juice  supplement.   Weight history reviewed.  02/06/24: 66 kg 03/23/24: 65 kg 06/06/24: 59 kg 9% weight loss within 3 months is significant for the time frame.   From discussion with patient and his granddaughter regarding usual intake over the past week, patient has been meeting < 50% of estimated energy requirement for > 5 days.  Patient meets criteria for severe malnutrition in the context of acute illness (PNA), given 9% weight loss within 3 months and intake meeting < 50% of estimated energy requirement for > 5 days.  Labs reviewed.  Prealbumin 5 (12/20) Prealbumin is an acute phase protein that is strongly affected by inflammation. It is not a good indicator of nutrition status.   Medications reviewed and include cholecalciferol , synthroid , IV rocephin .  NUTRITION - FOCUSED PHYSICAL EXAM:  Unable to complete  Diet Order:   Diet Order             Diet regular Room service appropriate? Yes; Fluid consistency: Thin  Diet effective now                   EDUCATION NEEDS:   Education needs have been addressed  Skin:  Skin Assessment: Reviewed RN Assessment  Last BM:  12/22  Height:   Ht Readings from Last 1 Encounters:  06/04/24 5' 5 (1.651 m)    Weight:   Wt Readings from Last 1 Encounters:  06/06/24 59 kg    Ideal Body Weight:  61.8 kg  BMI:  Body mass index is 21.63 kg/m.  Estimated Nutritional Needs:   Kcal:  1650-1850  Protein:  80-90 gm  Fluid:  1.6-1.8 LITTIE Suzen HUNT RD, LDN, CNSC Contact via secure chat. If unavailable, use group chat RD Inpatient.

## 2024-06-07 NOTE — Assessment & Plan Note (Signed)
 Secondary to multilobar pneumonia, treatment per above Symptomatic support: Tussionex 5 ml nightly as needed for cough.  Guaifenesin  6 mg p.o. twice daily, Tessalon  Perles 200 mg 3 times daily, Robitussin 5 mL every 4 hours as needed for cough, loosen phlegm

## 2024-06-07 NOTE — TOC Progression Note (Signed)
 Transition of Care [facility]) - Progression Note    Patient Details  Name: Kenneth Sanford MRN: [id number] Date of Birth: 16-Feb-1941  [facility] CM/SW Contact  Debarah Saunas, RN Phone Number: 06/07/2024, 12:15 PM  Clinical Narrative:    RNCM spoke with pt via telephone regarding discharge planning for Home Health Services. Offered pt medicare.gov list of home health agencies to choose from.  Pt chose [facility] to render RN and PT services. Darleene, liaison of [facility] notified. Patient made aware that [facility] will be in contact in 24-48 hours. HH agency information placed on After Visit Summary (AVS) for pt to contact with any home health questions after discharge. No DME needs identified at this time.   Pt active at [facility] Medical Provider: Celena SW: [facility]   Desk phone: [phone] Please call [facility] transfer coordinator for additional information [phone]    Expected Discharge Plan: Home/Self Care Barriers to Discharge: Continued Medical Work up               Expected Discharge Plan and Services In-house Referral: NA Discharge Planning Services: CM Consult   Living arrangements for the past 2 months: Single Family Home                                       Social Drivers of Health (SDOH) Interventions SDOH Screenings   Food Insecurity: No Food Insecurity (06/04/2024)  Housing: Low Risk (06/04/2024)  Transportation Needs: No Transportation Needs (06/04/2024)  Utilities: Not At Risk (06/04/2024)  Alcohol Screen: Low Risk (01/15/2024)  Depression (PHQ2-9): Low Risk (03/30/2024)  Social Connections: Moderately Isolated (06/04/2024)  Tobacco Use: Medium Risk (06/04/2024)    Readmission Risk Interventions     No data to display

## 2024-06-07 NOTE — Assessment & Plan Note (Signed)
-  Home levothyroxine 50 mcg daily before breakfast resumed

## 2024-06-07 NOTE — Progress Notes (Signed)
 Physical Therapy Treatment Patient Details Name: Kenneth Sanford MRN: [id number] DOB: December 09, 1940 Today's Date: 06/07/2024   History of Present Illness Pt is 83 yo male admitted on 06/04/24 with bil PNE in immunosuppressed 83 yo. Pt with mild hypotension and diarrhea. Pt with hx of hypothyroidism, unintentional weight loss, bladder CA who recently completed chemo and radiation 10/25.    PT Comments  Virtual follow-up completed with patient and granddaughter present. Pt feels he is approaching baseline mobility but still having issues with coughing. Family very supportive. Report providing supervision since return home and needs close guard when pt out of home visiting his wife in SNF prior to admission due to instability. Educated on self-care and safety, symptom awareness (due to history of dizziness and falls.) Agree that pt should use RW currently as this will provide more support than SPC to reduce fall risk. He denies any further syncopal episodes since admission. Granddaughter agrees that a gait belt would be helpful for safety when she ambulates with patient on days he does not move as well. RN reports stable trend of SpO2 on room air. Encouraged continued frequent mobility during admission with family and paramedic assist. Asked Lauraine, Paramedic to deliver gait belt for family which they are working on obtaining. Frequency reduced due to pt approaching baseline. Will benefit from HHPT follow-up after formal d/c from program. Patient will continue to benefit from skilled physical therapy services to further improve independence with functional mobility. All questions answered and pt/family aware they can reach out to team any time to have acute rehab team to touch base for any concerns/questions.    If plan is discharge home, recommend the following: A little help with walking and/or transfers;A little help with bathing/dressing/bathroom;Assistance with cooking/housework;Help with stairs or ramp for  entrance;Assist for transportation   Can travel by private vehicle        Equipment Recommendations  None recommended by PT    Recommendations for Other Services       Precautions / Restrictions Precautions Precautions: Fall Restrictions Weight Bearing Restrictions Per Provider Order: No     Mobility  Bed Mobility                    Transfers Overall transfer level: Needs assistance Equipment used: None Transfers: Sit to/from Stand Sit to Stand: Supervision           General transfer comment: Visualized patient transferring several times from bed with granddaughter present in room. No physical assist.    Ambulation/Gait                   Stairs             Wheelchair Mobility     Tilt Bed    Modified Rankin (Stroke Patients Only)       Balance                                            Communication Communication Communication: No apparent difficulties  Cognition Arousal: Alert Behavior During Therapy: WFL for tasks assessed/performed   PT - Cognitive impairments: No apparent impairments                         Following commands: Intact      Cueing Cueing Techniques: Verbal cues  Exercises      General  Comments General comments (skin integrity, edema, etc.): Patient and granddaughter present virtually during H@H  visit. Reports no more syncopal episodes. Reports dizziness upon standing inconsistent and his been going on for a long time - he has good awareness and remains standing until dizziness resolves before walking. Educated pt and granddaughter on safety with mobility, symptom awareness, and techniques to improve stability including RW use. Granddaughter reports increased instability noted when pt out of home visiting wife at nursing home - educated on use of gait belt and will benefit from this device - will have H@H  team provide (Spoke with Paramedic Sarah.)      Pertinent Vitals/Pain  Pain Assessment Pain Assessment: No/denies pain    Home Living                          Prior Function            PT Goals (current goals can now be found in the care plan section) Acute Rehab PT Goals Patient Stated Goal: return home PT Goal Formulation: With patient Time For Goal Achievement: 06/19/24 Potential to Achieve Goals: Good Progress towards PT goals: Progressing toward goals    Frequency    Min 1X/week      PT Plan      Co-evaluation              AM-PAC PT 6 Clicks Mobility   Outcome Measure  Help needed turning from your back to your side while in a flat bed without using bedrails?: A Little Help needed moving from lying on your back to sitting on the side of a flat bed without using bedrails?: A Little Help needed moving to and from a bed to a chair (including a wheelchair)?: A Little Help needed standing up from a chair using your arms (e.g., wheelchair or bedside chair)?: A Little Help needed to walk in hospital room?: A Little Help needed climbing 3-5 steps with a railing? : A Little 6 Click Score: 18    End of Session   Activity Tolerance: Patient tolerated treatment well Patient left: in bed (Sitting on edge of bed with granddaughter in room) Nurse Communication: Mobility status (Gait belt recommended for family to guard) PT Visit Diagnosis: Other abnormalities of gait and mobility (R26.89);History of falling (Z91.81)     Time: 8699-8681 PT Time Calculation (min) (ACUTE ONLY): 18 min  Charges:    $Self Care/Home Management: 8-22 PT General Charges $$ ACUTE PT VISIT: 1 Visit                     Leontine Roads, PT, DPT [facility]  Rehabilitation Services Physical Therapist Office: [phone] Website: [email or web address]    [name] 06/07/2024, 2:28 PM

## 2024-06-07 NOTE — Discharge Instructions (Signed)

## 2024-06-07 NOTE — Progress Notes (Addendum)
 Pt seen for routine [facility] morning visit. Pt appears well and slept okay other than getting up to go to the bathroom, which is on-going, as well as waking up coughing. Pt's granddaughter is present.   Vital signs and assessment obtained as noted. Lung sounds clear in uppers, clear and slightly diminished bases. Cough is somewhat productive/ congested and mucus is clear and thick. heart tones s1, s2, pulses strong and regular, no edema, abdomen is soft non tender, active bowel sounds and last bm was today and it was normal per pt.  Medications administered as noted.  IV care completed. Pt states it feels slightly itchy near IV site. Some non-tender swelling noted proximal to IV site but site flushes well. I attempted to obtain new IV access and was unsuccessful in left arm x2. Pt refuses IV access in his hand due to being worried he won't be able to wash his hands. Pt is okay with just leaving the existing IV site. Photo of IV site with tegaderm removed uploaded to media tab.  Pt had virtual visit with Shanda, RN and Dr. Sherre and Pt's granddaughter is present for encounter.  IMM letter signed at 11:45 am today.  Labs drawn.  Pt encouraged to call back with any problems throughout the day.

## 2024-06-07 NOTE — TOC Initial Note (Signed)
 Transition of Care [facility]) - Initial/Assessment Note    Patient Details  Name: Kenneth Sanford MRN: [id number] Date of Birth: 1941-04-01  [facility]) CM/SW Contact:    Debarah Saunas, RN Phone Number: 06/07/2024, 8:11 AM  Clinical Narrative:                 Pt is 83 yo male admitted on 06/04/24 with bil PNE in immunosuppressed 83 yo. Pt with mild hypotension and diarrhea.   Expected Discharge Plan: Home/Self Care Barriers to Discharge: Continued Medical Work up   Patient Goals and CMS Choice Patient states their goals for this hospitalization and ongoing recovery are:: remain home with self care          Expected Discharge Plan and Services In-house Referral: NA Discharge Planning Services: CM Consult   Living arrangements for the past 2 months: Single Family Home                                      Prior Living Arrangements/Services Living arrangements for the past 2 months: Single Family Home Lives with:: Adult Children Patient language and need for interpreter reviewed:: Yes Do you feel safe going back to the place where you live?: Yes      Need for Family Participation in Patient Care: No (Comment) Care giver support system in place?: Yes (comment) Current home services: DME (cane, rolling walker) Criminal Activity/Legal Involvement Pertinent to Current Situation/Hospitalization: No - Comment as needed  Activities of Daily Living   ADL Screening (condition at time of admission) Independently performs ADLs?: Yes (appropriate for developmental age) Does the patient have a NEW difficulty with bathing/dressing/toileting/self-feeding that is expected to last >3 days?: No Does the patient have a NEW difficulty with getting in/out of bed, walking, or climbing stairs that is expected to last >3 days?: No Does the patient have a NEW difficulty with communication that is expected to last >3 days?: No Is the patient deaf or have difficulty hearing?:  Yes Does the patient have difficulty seeing, even when wearing glasses/contacts?: No Does the patient have difficulty concentrating, remembering, or making decisions?: Yes  Permission Sought/Granted Permission sought to share information with : Family Supports Permission granted to share information with : Yes, Verbal Permission Granted  Share Information with NAME: Demetria           Emotional Assessment       Orientation: : Oriented to Self, Oriented to Place, Oriented to  Time, Oriented to Situation Alcohol / Substance Use: Not Applicable Psych Involvement: No (comment)  Admission diagnosis:  Pneumonia [J18.9] Patient Active Problem List   Diagnosis Date Noted   CAP (community acquired pneumonia) 06/04/2024   Pneumonia 06/04/2024   Port-A-Cath in place 04/12/2024   Acute cystitis 04/12/2024   Constipation 03/23/2024   Dehydration 03/09/2024   Anemia due to antineoplastic chemotherapy 03/08/2024   Neutropenia 03/02/2024   Bladder spasms 02/17/2024   Pancytopenia (HCC) 02/10/2024   Thrombocytopenia 02/10/2024   Urothelial carcinoma of bladder (HCC) 11/18/2023   Epistaxis 08/19/2014   History of acute myeloid leukemia 08/19/2014   Paroxysmal supraventricular tachycardia 08/19/2014   PCP:  Benjamine Aland, MD Pharmacy:   [facility] [phone] [address], [place] - [address] [place] [address] Phone: [phone] Fax: [phone]     Social Drivers of Health (SDOH) Social History: SDOH Screenings   Food Insecurity: No Food Insecurity (  06/04/2024)  Housing: Low Risk (06/04/2024)  Transportation Needs: No Transportation Needs (06/04/2024)  Utilities: Not At Risk (06/04/2024)  Alcohol Screen: Low Risk (01/15/2024)  Depression (PHQ2-9): Low Risk (03/30/2024)  Social Connections: Moderately Isolated (06/04/2024)  Tobacco Use: Medium Risk (06/04/2024)   SDOH Interventions:     Readmission Risk Interventions     No data to  display

## 2024-06-07 NOTE — Assessment & Plan Note (Addendum)
 Secondary to cough in setting of multilobar pneumonia Tussionex nightly as needed for cough ordered

## 2024-06-07 NOTE — Plan of Care (Signed)

## 2024-06-07 NOTE — Assessment & Plan Note (Signed)
 Continue home oxybutynin  every 6 hours as needed for bladder spasm

## 2024-06-07 NOTE — Progress Notes (Signed)
 Patient alerting for Tachypnea for Respiratory Rate- Patient reached via outbound call-Patient reported and confirmed alerting readings are inaccurate and patient is asymptomatic.

## 2024-06-07 NOTE — Assessment & Plan Note (Addendum)
 Multilobar pneumonia 06/07/2024: Day 4 of antibiotic DuoNebs every 6 hours as needed for wheezing and shortness of breath Incentive spirometry, flutter valve

## 2024-06-07 NOTE — Plan of Care (Signed)
" °  Problem: Education: Goal: Knowledge of General Education information will improve Description: Including pain rating scale, medication(s)/side effects and non-pharmacologic comfort measures Outcome: Progressing   Problem: Health Behavior/Discharge Planning: Goal: Ability to manage health-related needs will improve Outcome: Progressing   Problem: Clinical Measurements: Goal: Ability to maintain clinical measurements within normal limits will improve Outcome: Progressing Goal: Respiratory complications will improve Outcome: Progressing   Problem: Activity: Goal: Risk for activity intolerance will decrease Outcome: Progressing   Problem: Nutrition: Goal: Adequate nutrition will be maintained Outcome: Progressing   Problem: Coping: Goal: Level of anxiety will decrease Outcome: Progressing   Problem: Safety: Goal: Ability to remain free from injury will improve Outcome: Progressing   Problem: Skin Integrity: Goal: Risk for impaired skin integrity will decrease Outcome: Progressing   Problem: Clinical Measurements: Goal: Ability to maintain a body temperature in the normal range will improve Outcome: Progressing   Problem: Respiratory: Goal: Ability to maintain adequate ventilation will improve Outcome: Progressing Goal: Ability to maintain a clear airway will improve Outcome: Progressing   "

## 2024-06-07 NOTE — Assessment & Plan Note (Signed)
 Boost every 24 hours

## 2024-06-07 NOTE — Plan of Care (Signed)
" °  Problem: Clinical Measurements: Goal: Ability to maintain clinical measurements within normal limits will improve Outcome: Progressing   Problem: Clinical Measurements: Goal: Will remain free from infection Outcome: Progressing   Problem: Clinical Measurements: Goal: Diagnostic test results will improve Outcome: Progressing   Problem: Clinical Measurements: Goal: Respiratory complications will improve Outcome: Progressing   Problem: Nutrition: Goal: Adequate nutrition will be maintained Outcome: Progressing   Problem: Elimination: Goal: Will not experience complications related to bowel motility Outcome: Progressing Goal: Will not experience complications related to urinary retention Outcome: Progressing   Problem: Pain Managment: Goal: General experience of comfort will improve and/or be controlled Outcome: Progressing   Problem: Safety: Goal: Ability to remain free from injury will improve Outcome: Progressing   Problem: Skin Integrity: Goal: Risk for impaired skin integrity will decrease Outcome: Progressing   "

## 2024-06-07 NOTE — Progress Notes (Addendum)
 " PROGRESS NOTE - Telemedicine  Kenneth Sanford  FMW:[id number] DOB: Feb 06, 1941 DOA: 06/04/2024 PCP: [facility]   [name] is an 83 year old male with history of hypothyroid, bladder cancer status post chemotherapy and radiation completion in 10/25, intermittent diarrhea, recent COVID-19 infection, unintentional weight loss.  06/04/2024: He presents to the ED for chief concerns of severe cough since Thanksgiving.  12/19: [facility] for bilateral pneumonia in immunosuppressed patient.  First dose antibiotic initiated on 12/19: Azithromycin  500 mg IV (at 2116-hour), ceftriaxone  1 g IV (1812-hour)  12/20: Patient transferred to hospital at home care.  06/06/2024: Patient stayed 1 more night in the hospital.  12/22: I assumed care of the patient.  Day 4 of antibiotic. Patient reports he wakes up a lot in the middle of the night to urinate and to cough.  Otherwise he is feeling fine.  Patient has very little appetite.   Assessment & Plan:   Principal Problem:   CAP (community acquired pneumonia) Active Problems:   Cough   Insomnia   Protein-calorie malnutrition, severe   History of acute myeloid leukemia   Paroxysmal supraventricular tachycardia   Urothelial carcinoma of bladder (HCC)   Bladder spasm   Constipation   Port-A-Cath in place   Pneumonia   Hypothyroidism   Assessment and Plan:  * CAP (community acquired pneumonia) Multilobar pneumonia 06/07/2024: Day 4 of antibiotic DuoNebs every 6 hours as needed for wheezing and shortness of breath Incentive spirometry, flutter valve  Insomnia Secondary to cough in setting of multilobar pneumonia Tussionex nightly as needed for cough ordered  Cough Secondary to multilobar pneumonia, treatment per above Symptomatic support: Tussionex 5 ml nightly as needed for cough.  Guaifenesin  6 mg p.o. twice daily, Tessalon  Perles 200 mg 3 times daily, Robitussin 5 mL every 4 hours as  needed for cough, loosen phlegm  Protein-calorie malnutrition, severe Boost every 24 hours  Hypothyroidism Home levothyroxine  50 mcg daily before breakfast resumed  Bladder spasm Continue home oxybutynin  every 6 hours as needed for bladder spasm  Urothelial carcinoma of bladder (HCC) Continue outpatient follow-up with medical oncology as appropriate   DVT prophylaxis: Enoxaparin  40 mg subcutaneous every 24 hours Code Status: Full code Family Communication: Granddaughter, Kenneth Sanford at bedside was updated Disposition Plan:  Level of care: Hospital at Home Med-Surg  Consultants:  PT, OT, pharmacy  Procedures:  None  Antimicrobials: 06/07/2024: Day 4 of antibiotics  Subjective:  At bedside, via video telemedicine encounter, patient was aao x 4(self, dob, yr, location) and he does not appear to be in acute distress. He reports that he gets up several times per night to urinate. Family reports he also urinates a lot during the day.   He reports he coughs a lot and that  He denies anything else bothering him.   Objective: Vitals:   06/06/24 1013 06/06/24 2031 06/06/24 2152 06/07/24 1100  BP: 118/78 125/82  132/77  Pulse: 67 (!) 58 72 87  Resp: 18 16 17 18   Temp: 97.7 F (36.5 C) 97.7 F (36.5 C)  97.8 F (36.6 C)  TempSrc: Oral Oral  Oral  SpO2: 95% 98% 95% 95%  Weight:  59 kg    Height:       No intake or output data in the 24 hours ending 06/07/24 1739  Filed Weights   06/04/24 1444 06/06/24 2031  Weight: 59 kg 59 kg   Examination was completed with the assistance of: Kenneth Sanford, who was present in  the house during the virtual encounter:  General exam: Appears frail, calm and comfortable  Respiratory system: Clear to auscultation. Respiratory effort normal. Cardiovascular system: S1 & S2 heard, RRR. No murmurs. No pedal edema. Gastrointestinal system: Abdomen is nondistended, soft and nontender. No organomegaly or masses felt. Normal bowel sounds  heard. Central nervous system: Alert and oriented. No focal neurological deficits. Extremities: Symmetric 5 x 5 power. Skin: No rashes, lesions or ulcers Psychiatry: Judgement and insight appear normal. Mood & affect appropriate.   Data Reviewed: I have personally reviewed following labs and imaging studies  CBC: Recent Labs  Lab 06/04/24 1505 06/05/24 0443  WBC 11.3* 5.5  HGB 12.5* 10.0*  HCT 36.8* 29.6*  MCV 95.3 96.4  PLT 187 153   Basic Metabolic Panel: Recent Labs  Lab 06/04/24 1505 06/05/24 0443 06/07/24 1259  NA 136 139 140  K 3.8 3.9 3.7  CL 99 105 106  CO2 25 25 26   GLUCOSE 157* 97 100*  BUN 13 13 10   CREATININE 1.27* 1.06 1.25*  CALCIUM 9.2 8.2* 8.4*  MG  --  2.2  --    GFR: Estimated Creatinine Clearance: 36.7 mL/min (A) (by C-G formula based on SCr of 1.25 mg/dL (H)).  Thyroid Function Tests: Recent Labs    06/05/24 0443  TSH 1.370   Sepsis Labs: Recent Labs  Lab 06/04/24 1700  LATICACIDVEN 1.2   Recent Results (from the past 240 hours)  Culture, blood (routine x 2)     Status: None (Preliminary result)   Collection Time: 06/04/24  4:48 PM   Specimen: BLOOD RIGHT ARM  Result Value Ref Range Status   Specimen Description   Final    BLOOD RIGHT ARM Performed at [facility], [address], [place], [place] [address]    Special Requests   Final    BOTTLES DRAWN AEROBIC AND ANAEROBIC Blood Culture adequate volume Performed at [facility], [address], [place], [place] [address]    Culture   Final    NO GROWTH 3 DAYS Performed at [facility], [address], [place], [place] [address]    Report Status PENDING  Incomplete  Culture, blood (routine x 2)     Status: None (Preliminary result)   Collection Time: 06/04/24  4:48 PM   Specimen: BLOOD  Result Value Ref Range Status   Specimen Description   Final    BLOOD LEFT ANTECUBITAL Performed at [facility], [address]  [place], [place] [address]    Special Requests   Final    BOTTLES DRAWN AEROBIC AND ANAEROBIC Blood Culture adequate volume Performed at [facility], [address] [place], [place] [address]    Culture   Final    NO GROWTH 3 DAYS Performed at [facility], [address] [place], [place] [address]    Report Status PENDING  Incomplete  Resp panel by RT-PCR (RSV, Flu A&B, Covid) Anterior Nasal Swab     Status: None   Collection Time: 06/04/24  5:07 PM   Specimen: Anterior Nasal Swab  Result Value Ref Range Status   SARS Coronavirus 2 by RT PCR NEGATIVE NEGATIVE Final    Comment: (NOTE) SARS-CoV-2 target nucleic acids are NOT DETECTED.  The SARS-CoV-2 RNA is generally detectable in upper respiratory specimens during the acute phase of infection. The lowest concentration of SARS-CoV-2 viral copies this assay can detect is 138 copies/mL. A negative result does not preclude  SARS-Cov-2 infection and should not be used as the sole basis for treatment or other patient management decisions. A negative result may occur with  improper specimen collection/handling, submission of specimen other than nasopharyngeal swab, presence of viral mutation(s) within the areas targeted by this assay, and inadequate number of viral copies(<138 copies/mL). A negative result must be combined with clinical observations, patient history, and epidemiological information. The expected result is Negative.  Fact Sheet for Patients:  bloggercourse.com  Fact Sheet for Healthcare Providers:  seriousbroker.it  This test is no t yet approved or cleared by the [place]  FDA and  has been authorized for detection and/or diagnosis of SARS-CoV-2 by FDA under an Emergency Use Authorization (EUA). This EUA will remain  in effect (meaning this test can be used) for the duration of the COVID-19 declaration under Section 564(b)(1) of the Act,  21 U.S.C.section 360bbb-3(b)(1), unless the authorization is terminated  or revoked sooner.       Influenza A by PCR NEGATIVE NEGATIVE Final   Influenza B by PCR NEGATIVE NEGATIVE Final    Comment: (NOTE) The Xpert Xpress SARS-CoV-2/FLU/RSV plus assay is intended as an aid in the diagnosis of influenza from Nasopharyngeal swab specimens and should not be used as a sole basis for treatment. Nasal washings and aspirates are unacceptable for Xpert Xpress SARS-CoV-2/FLU/RSV testing.  Fact Sheet for Patients: bloggercourse.com  Fact Sheet for Healthcare Providers: seriousbroker.it  This test is not yet approved or cleared by the [place]  FDA and has been authorized for detection and/or diagnosis of SARS-CoV-2 by FDA under an Emergency Use Authorization (EUA). This EUA will remain in effect (meaning this test can be used) for the duration of the COVID-19 declaration under Section 564(b)(1) of the Act, 21 U.S.C. section 360bbb-3(b)(1), unless the authorization is terminated or revoked.     Resp Syncytial Virus by PCR NEGATIVE NEGATIVE Final    Comment: (NOTE) Fact Sheet for Patients: bloggercourse.com  Fact Sheet for Healthcare Providers: seriousbroker.it  This test is not yet approved or cleared by the [place]  FDA and has been authorized for detection and/or diagnosis of SARS-CoV-2 by FDA under an Emergency Use Authorization (EUA). This EUA will remain in effect (meaning this test can be used) for the duration of the COVID-19 declaration under Section 564(b)(1) of the Act, 21 U.S.C. section 360bbb-3(b)(1), unless the authorization is terminated or revoked.  Performed at [facility], [address] [place], [place] [address]   MRSA Next Gen by PCR, Nasal     Status: None   Collection Time: 06/05/24  4:54 PM   Specimen: Nasal Mucosa; Nasal Swab   Result Value Ref Range Status   MRSA by PCR Next Gen NOT DETECTED NOT DETECTED Final    Comment: (NOTE) The GeneXpert MRSA Assay (FDA approved for NASAL specimens only), is one component of a comprehensive MRSA colonization surveillance program. It is not intended to diagnose MRSA infection nor to guide or monitor treatment for MRSA infections. Test performance is not FDA approved in patients less than 27 years old. Performed at [facility], [address] [place], [place] [address]      Radiology Studies: No results found.  Scheduled Meds:  azithromycin   500 mg Oral Daily   benzonatate   200 mg Oral TID   cholecalciferol   1,000 Units Oral Daily   enoxaparin  (LOVENOX ) injection  40 mg Subcutaneous Q24H   feeding supplement  1 Container Oral Q24H   guaiFENesin   600 mg Oral BID  levothyroxine   50 mcg Oral QAC breakfast   Continuous Infusions:  cefTRIAXone  (ROCEPHIN )  IV Stopped (06/07/24 1228)    LOS: 2 days   Time spent: 50 minutes  Dr. Sherre Location: [place]  [facility] If 7PM-7AM, please contact night-coverage 06/07/2024, 5:39 PM   "

## 2024-06-08 MED ORDER — GUAIFENESIN ER 600 MG PO TB12: 600.0000 mg | ORAL_TABLET | Freq: Two times a day (BID) | ORAL | 0 refills | Status: AC

## 2024-06-08 MED ORDER — ONDANSETRON HCL 4 MG PO TABS: 4.0000 mg | ORAL_TABLET | Freq: Four times a day (QID) | ORAL | 0 refills | Status: AC | PRN

## 2024-06-08 MED ORDER — HYDROCOD POLI-CHLORPHE POLI ER 10-8 MG/5ML PO SUER: 5.0000 mL | Freq: Every evening | ORAL | 0 refills | Status: AC | PRN

## 2024-06-08 MED ORDER — IPRATROPIUM-ALBUTEROL 0.5-2.5 (3) MG/3ML IN SOLN: 3.0000 mL | Freq: Four times a day (QID) | RESPIRATORY_TRACT | 0 refills | Status: AC | PRN

## 2024-06-08 NOTE — Discharge Summary (Addendum)
 " Physician Discharge Summary   Patient: Kenneth Sanford MRN: [id number] DOB: 1941/03/03  Admit date:     06/04/2024  Discharge date: 06/08/2024  Discharge Physician: Dr. Sherre   PCP: [facility]   At bedside, via video telemedicine encounter, patient was able to state their first and last name, and date of birth.  Recommendations at discharge:   Tussionex 5 mL nightly.  For sleep has been prescribed for you on discharge Follow-up with your primary care doctor within 2 to 4 weeks of discharge for electrolyte, lab monitoring. Use your incentive spirometry, flutter valve every 1-2 hours for 5-10 times during waking hours.  Discharge Diagnoses: Principal Problem:   CAP (community acquired pneumonia) Active Problems:   Cough   Insomnia   Protein-calorie malnutrition, severe   History of acute myeloid leukemia   Paroxysmal supraventricular tachycardia   Urothelial carcinoma of bladder (HCC)   Bladder spasm   Constipation   Port-A-Cath in place   Pneumonia   Hypothyroidism  Resolved Problems:   * No resolved hospital problems. [facility] Course:  [name] is an 83 year old male with history of hypothyroid, bladder cancer status post chemotherapy and radiation completion in 10/25, intermittent diarrhea, recent COVID-19 infection, unintentional weight loss.  06/04/2024: He presents to the ED for chief concerns of severe cough since Thanksgiving.  12/19: [facility] for bilateral pneumonia in immunosuppressed patient.  First dose antibiotic initiated on 12/19: Azithromycin  500 mg IV (at 2116-hour), ceftriaxone  1 g IV (1812-hour)  12/20: Patient transferred to hospital at home care.  06/06/2024: Patient stayed 1 more night in the hospital.  12/22: I assumed care of the patient.  Day 4 of antibiotic. Patient reports he wakes up a lot in the middle of the night to urinate and to cough.  Otherwise he is feeling fine.  Patient has  very little appetite.  12/23: Completed day 5 of antibiotic.  He reports he feels well. He states he has been sleeping a lot. Pt and granddaughter states the tussionex did help with the cough at night. DME Nebulizer has been ordered and paramedic will teach patient how to use. Tussionex prescribed on discharge.   Assessment and Plan:  * CAP (community acquired pneumonia) Multilobar pneumonia 06/07/2024: Day 4 of antibiotic DuoNebs every 6 hours as needed for wheezing and shortness of breath Incentive spirometry, flutter valve  Insomnia Secondary to cough in setting of multilobar pneumonia Tussionex nightly as needed for cough ordered  Cough Secondary to multilobar pneumonia, treatment per above Symptomatic support: Tussionex 5 ml nightly as needed for cough.  Guaifenesin  6 mg p.o. twice daily, Tessalon  Perles 200 mg 3 times daily, Robitussin 5 mL every 4 hours as needed for cough, loosen phlegm  Protein-calorie malnutrition, severe Boost every 24 hours  Hypothyroidism Home levothyroxine  50 mcg daily before breakfast resumed  Bladder spasm Continue home oxybutynin  every 6 hours as needed for bladder spasm  Urothelial carcinoma of bladder (HCC) Continue outpatient follow-up with medical oncology as appropriate     Pain control - [place]  Controlled Substance Reporting System database was reviewed. and patient was instructed, not to drive, operate heavy machinery, perform activities at heights, swimming or participation in water activities or provide baby-sitting services while on Pain, Sleep and Anxiety Medications; until their outpatient Physician has advised to do so again. Also recommended to not to take more than prescribed Pain, Sleep and Anxiety Medications.  Consultants: PT, OT, TOC Procedures performed: no  Disposition:  Home Diet recommendation:  Regular diet DISCHARGE MEDICATION: Allergies as of 06/08/2024       Reactions   Acyclovir And Related Other (See  Comments)   Reaction unknown   Amphotericin B Other (See Comments)   Severe chills   Aspirin Other (See Comments)   Pt cannot take due to having had leukemia        Medication List     STOP taking these medications    prochlorperazine  10 MG tablet Commonly known as: COMPAZINE    sulfamethoxazole -trimethoprim  800-160 MG tablet Commonly known as: BACTRIM  DS       TAKE these medications    acetaminophen  500 MG tablet Commonly known as: TYLENOL  Take 500-1,000 mg by mouth every 6 (six) hours as needed for mild pain or headache.   chlorpheniramine-HYDROcodone  10-8 MG/5ML Commonly known as: TUSSIONEX Take 5 mLs by mouth at bedtime as needed for cough.   chlorpheniramine-HYDROcodone  10-8 MG/5ML Commonly known as: TUSSIONEX Take 5 mLs by mouth at bedtime as needed for up to 5 days for cough.   docusate sodium 100 MG capsule Commonly known as: COLACE Take 100 mg by mouth daily as needed for moderate constipation.   guaiFENesin  600 MG 12 hr tablet Commonly known as: MUCINEX  Take 1 tablet (600 mg total) by mouth 2 (two) times daily for 3 days.   ipratropium-albuterol  0.5-2.5 (3) MG/3ML Soln Commonly known as: DUONEB Take 3 mLs by nebulization every 6 (six) hours as needed.   lidocaine -prilocaine  cream Commonly known as: EMLA  Apply to affected area once   mirtazapine 15 MG tablet Commonly known as: REMERON Take 7.5 mg by mouth at bedtime.   ondansetron  4 MG tablet Commonly known as: ZOFRAN  Take 1 tablet (4 mg total) by mouth every 6 (six) hours as needed for nausea. What changed:  medication strength how much to take when to take this reasons to take this   oxybutynin  5 MG tablet Commonly known as: DITROPAN  TAKE 1 TABLET (5 MG TOTAL) BY MOUTH 2 (TWO) TIMES DAILY AS NEEDED FOR BLADDER SPASMS.   Synthroid  50 MCG tablet Generic drug: levothyroxine  Take 50 mcg by mouth daily before breakfast.   Vitamin D3 50 MCG (2000 UT) Tabs Take 2,000 Units by mouth at  bedtime.               Durable Medical Equipment  (From admission, onward)           Start     Ordered   06/08/24 0729  For home use only DME Nebulizer machine  Once       Question Answer Comment  Patient needs a nebulizer to treat with the following condition Bronchitis   Patient needs a nebulizer to treat with the following condition Shortness of breath   Length of Need 6 Months   Additional equipment included Administration kit   Additional equipment included Filter      06/08/24 0728            Contact information for follow-up providers     [facility] Follow up.   Specialty: Home Health Services Why: Home Health Contact information: [address] [place] [address] [phone]              Contact information for after-discharge care     Durable Medical Equipment     [facility] (DME) .   Service: Durable Medical Equipment Contact information: [address] [place]  [address] [phone]  Home Medical Care     [facility]) .   Service: Home Health Services Contact information: [address] [place]  [address] [phone]                    Physical exam was completed with the assistance of Tyrell Lewis, paramedic, who was present in the home at the time of the virtual encounter.  Discharge Exam: Filed Weights   06/04/24 1444 06/06/24 2031 06/07/24 1944  Weight: 59 kg 59 kg 64.4 kg   Physical Exam Constitutional:      Appearance: Normal appearance.  HENT:     Head: Normocephalic and atraumatic.     Right Ear: External ear normal.     Left Ear: External ear normal.     Nose: Nose normal.     Mouth/Throat:     Mouth: Mucous membranes are moist.  Eyes:     Extraocular Movements: Extraocular movements intact.     Conjunctiva/sclera: Conjunctivae normal.  Neck:     Comments: And  midline Cardiovascular:     Rate and Rhythm: Normal rate and regular rhythm.     Heart sounds: Normal heart sounds. No murmur heard. Pulmonary:     Effort: Pulmonary effort is normal. No respiratory distress.     Comments: Generalized expiratory wheezes on all lung fields with more diminished lung sounds in the bilateral lower bases. Abdominal:     General: Bowel sounds are normal.     Palpations: Abdomen is soft.     Tenderness: There is no abdominal tenderness.  Musculoskeletal:     Cervical back: Normal range of motion.  Skin:    Capillary Refill: Capillary refill takes less than 2 seconds.     Coloration: Skin is not jaundiced.  Neurological:     Mental Status: He is alert and oriented to person, place, and time. Mental status is at baseline.  Psychiatric:        Mood and Affect: Mood normal.        Behavior: Behavior normal.   Condition at discharge: good  The results of significant diagnostics from this hospitalization (including imaging, microbiology, ancillary and laboratory) are listed below for reference.   Imaging Studies:  CT Chest W Contrast Result Date: 06/04/2024 CLINICAL DATA:  Pneumonia, complication suspected, xray done EXAM: CT CHEST WITH CONTRAST TECHNIQUE: Multidetector CT imaging of the chest was performed during intravenous contrast administration. RADIATION DOSE REDUCTION: This exam was performed according to the departmental dose-optimization program which includes automated exposure control, adjustment of the mA and/or kV according to patient size and/or use of iterative reconstruction technique. CONTRAST:  75mL OMNIPAQUE  IOHEXOL  300 MG/ML  SOLN COMPARISON:  None Available. FINDINGS: Cardiovascular: Normal cardiac size. No pericardial effusion. No aortic aneurysm. Mediastinum/Nodes: Visualized thyroid gland appears grossly unremarkable. No solid / cystic mediastinal masses. The esophagus is nondistended precluding optimal assessment. There are mildly prominent  bilateral hilar lymph nodes which are nonspecific. No mediastinal or axillary lymphadenopathy by size criteria. Lungs/Pleura: The central tracheo-bronchial tree is patent. There is mild, smooth, circumferential thickening of the segmental and subsegmental bronchial walls, throughout bilateral lungs, which is nonspecific. Findings are most commonly seen with bronchitis or reactive airway disease, such as asthma. There are heterogeneous opacities in the bilateral lower lobes without significant volume loss, favoring multilobar pneumonia. Follow-up to clearing is recommended. No pleural effusion or pneumothorax. Evaluation for discrete lung nodule is limited due to background lung parenchymal changes. There is a  subcentimeter sized noncalcified opacity in the right major fissure, favored to represent intra fissural lymph node. Upper Abdomen: There are several simple cysts in the right kidney with largest arising from the upper pole, posteriorly measuring up to 5.6 x 7.0 cm. There is contracted gallbladder containing small volume calcified gallstones. There is diffuse thickening of bilateral adrenal glands, without discrete nodule. Findings are nonspecific but mostly associated with adrenal hyperplasia. Remaining visualized upper abdominal viscera within normal limits. Post hernia repair changes noted in the epigastric region. Musculoskeletal: A CT Port-a-Cath is seen in the right upper chest wall with the catheter terminating in the cavo-atrial junction region. Visualized soft tissues of the chest wall are otherwise grossly unremarkable. No suspicious osseous lesions. There are mild to moderate multilevel degenerative changes in the visualized spine. IMPRESSION: 1. There are heterogeneous opacities in the bilateral lower lobes without significant volume loss, favoring multilobar pneumonia. Follow-up to clearing is recommended. 2. Multiple other nonacute observations, as described above. Aortic Atherosclerosis  (ICD10-I70.0). Electronically Signed   By: Ree Molt M.D.   On: 06/04/2024 17:48   DG Chest 2 View Result Date: 06/04/2024 CLINICAL DATA:  Shortness of breath EXAM: CHEST - 2 VIEW COMPARISON:  09/18/2021, PET CT 11/28/2023 FINDINGS: Right-sided central venous port tip at the cavoatrial junction. Hypoventilatory changes with mild basilar atelectasis on the left. Chronic elevation of left diaphragm. Probable bandlike atelectasis in the right hilar area. Stable cardiomediastinal silhouette. No pleural effusion or pneumothorax. Evidence of prior hernia repair. IMPRESSION: Hypoventilatory changes with left basilar atelectasis and probable platelike atelectasis in the right hilar area. Electronically Signed   By: Luke Bun M.D.   On: 06/04/2024 16:02   Microbiology: Results for orders placed or performed during the hospital encounter of 06/04/24  Culture, blood (routine x 2)     Status: None (Preliminary result)   Collection Time: 06/04/24  4:48 PM   Specimen: BLOOD RIGHT ARM  Result Value Ref Range Status   Specimen Description   Final    BLOOD RIGHT ARM Performed at [facility], [address], [place], [place] [address]    Special Requests   Final    BOTTLES DRAWN AEROBIC AND ANAEROBIC Blood Culture adequate volume Performed at [facility], [address], [place], [place] [address]    Culture   Final    NO GROWTH 4 DAYS Performed at [facility], [address] [place], [place] [address]    Report Status PENDING  Incomplete  Culture, blood (routine x 2)     Status: None (Preliminary result)   Collection Time: 06/04/24  4:48 PM   Specimen: BLOOD  Result Value Ref Range Status   Specimen Description   Final    BLOOD LEFT ANTECUBITAL Performed at [facility], [address] [place], [place] [address]    Special Requests   Final    BOTTLES DRAWN AEROBIC AND ANAEROBIC Blood Culture adequate volume Performed at [facility], [address] [place], [place] [address]    Culture   Final    NO GROWTH 4 DAYS Performed at [facility], [address] [place], [place] [address]    Report Status PENDING  Incomplete  Resp panel by RT-PCR (RSV, Flu A&B, Covid) Anterior Nasal Swab     Status: None   Collection Time: 06/04/24  5:07 PM   Specimen: Anterior Nasal Swab  Result Value Ref Range Status   SARS Coronavirus 2 by  RT PCR NEGATIVE NEGATIVE Final    Comment: (NOTE) SARS-CoV-2 target nucleic acids are NOT DETECTED.  The SARS-CoV-2 RNA is generally detectable in upper respiratory specimens during the acute phase of infection. The lowest concentration of SARS-CoV-2 viral copies this assay can detect is 138 copies/mL. A negative result does not preclude SARS-Cov-2 infection and should not be used as the sole basis for treatment or other patient management decisions. A negative result may occur with  improper specimen collection/handling, submission of specimen other than nasopharyngeal swab, presence of viral mutation(s) within the areas targeted by this assay, and inadequate number of viral copies(<138 copies/mL). A negative result must be combined with clinical observations, patient history, and epidemiological information. The expected result is Negative.  Fact Sheet for Patients:  bloggercourse.com  Fact Sheet for Healthcare Providers:  seriousbroker.it  This test is no t yet approved or cleared by the [place]  FDA and  has been authorized for detection and/or diagnosis of SARS-CoV-2 by FDA under an Emergency Use Authorization (EUA). This EUA will remain  in effect (meaning this test can be used) for the duration of the COVID-19 declaration under Section 564(b)(1) of the Act, 21 U.S.C.section 360bbb-3(b)(1), unless the authorization is terminated  or revoked sooner.       Influenza A by PCR NEGATIVE NEGATIVE Final    Influenza B by PCR NEGATIVE NEGATIVE Final    Comment: (NOTE) The Xpert Xpress SARS-CoV-2/FLU/RSV plus assay is intended as an aid in the diagnosis of influenza from Nasopharyngeal swab specimens and should not be used as a sole basis for treatment. Nasal washings and aspirates are unacceptable for Xpert Xpress SARS-CoV-2/FLU/RSV testing.  Fact Sheet for Patients: bloggercourse.com  Fact Sheet for Healthcare Providers: seriousbroker.it  This test is not yet approved or cleared by the [place]  FDA and has been authorized for detection and/or diagnosis of SARS-CoV-2 by FDA under an Emergency Use Authorization (EUA). This EUA will remain in effect (meaning this test can be used) for the duration of the COVID-19 declaration under Section 564(b)(1) of the Act, 21 U.S.C. section 360bbb-3(b)(1), unless the authorization is terminated or revoked.     Resp Syncytial Virus by PCR NEGATIVE NEGATIVE Final    Comment: (NOTE) Fact Sheet for Patients: bloggercourse.com  Fact Sheet for Healthcare Providers: seriousbroker.it  This test is not yet approved or cleared by the [place]  FDA and has been authorized for detection and/or diagnosis of SARS-CoV-2 by FDA under an Emergency Use Authorization (EUA). This EUA will remain in effect (meaning this test can be used) for the duration of the COVID-19 declaration under Section 564(b)(1) of the Act, 21 U.S.C. section 360bbb-3(b)(1), unless the authorization is terminated or revoked.  Performed at [facility], [address] [place], [place] [address]   MRSA Next Gen by PCR, Nasal     Status: None   Collection Time: 06/05/24  4:54 PM   Specimen: Nasal Mucosa; Nasal Swab  Result Value Ref Range Status   MRSA by PCR Next Gen NOT DETECTED NOT DETECTED Final    Comment: (NOTE) The GeneXpert MRSA Assay (FDA approved for  NASAL specimens only), is one component of a comprehensive MRSA colonization surveillance program. It is not intended to diagnose MRSA infection nor to guide or monitor treatment for MRSA infections. Test performance is not FDA approved in patients less than 19 years old. Performed at [facility], [address] [place], [place] [address]    Labs: CBC: Recent Labs  Lab  06/04/24 1505 06/05/24 0443  WBC 11.3* 5.5  HGB 12.5* 10.0*  HCT 36.8* 29.6*  MCV 95.3 96.4  PLT 187 153   Basic Metabolic Panel: Recent Labs  Lab 06/04/24 1505 06/05/24 0443 06/07/24 1259  NA 136 139 140  K 3.8 3.9 3.7  CL 99 105 106  CO2 25 25 26   GLUCOSE 157* 97 100*  BUN 13 13 10   CREATININE 1.27* 1.06 1.25*  CALCIUM 9.2 8.2* 8.4*  MG  --  2.2  --    Discharge time spent: greater than 30 minutes.  Signed: Dr. Sherre [facility] Location: [place]  06/08/2024 "

## 2024-06-08 NOTE — Progress Notes (Signed)
 Patient had a current health alarm for oxygen level at 83%. Called and had patient's granddaughter to check on him. Woke patient up and had him to take deep breaths. Oxygen level now up to 96% on current health and confirmed with portable pulse ox in home. Family member states patient was snoring when she went to check on him. Patient in no acute distress and only endorsed a slight headache for which some prn Tylenol  was given.

## 2024-06-08 NOTE — Progress Notes (Signed)
 The pt was seen today for and evening hospital at home visit. The pt was alert and oriented x4. His skin was warm,dry and appropriate in color. He had a strong radial pulse and was breathing normally.  Vitals were taken and are as noted. Pt denied any pain at this time but stated that he has been having a lot coughing with thick white mucus production.  Lung sounds were ausculted and the pt was noted to clear upper lobes but diminished lower lobes; he was asked if he needed a breathing treatment at this time and he denied same. The pt was asked if he was having chest pain or shortness of breath and denied same.   The pt was given his evening meds and cough medicine.The pt was taught and assisted in using the airway devices/ the virtual nurse was called and spoke with the pt and his niece, they informed that they will call at 2200 for his nighttime medications.  The pt was asked if there was anything else that could be done for him and he denied same, They were informed to call back if anything were to change.

## 2024-06-08 NOTE — Progress Notes (Signed)
 H@H  medic out to see patient this afternoon for visit and d/c. On arrival I was greeted at the door by the patients granddaughter. See flowsheet for assessment. A video visit with the RN and provider was conducted and completed. The discharge summary was explained to the patient and granddaughter with the RN via tablet. Medications were explained and how to pick them up. The granddaughter stated that she will dispose of any medications that he will not be taking anymore. Education was provided on use of nebulizer machine including teach back from the patients granddaughter. All equipment was removed from the home. Pt was discharged at this time. Pt left in care of family. No other tasks needed at this time. H@H  visit complete.

## 2024-06-08 NOTE — Progress Notes (Signed)
 Completed virtual rounds with MD,paramedic at patient bedside. POC reviewed and discussed ,patient voices understanding and agreement. Pt reminded to call RN for any needs, RN and MD available at all times. Pt voices understanding. Pt aware of next planned visit and next call from RN.

## 2024-06-08 NOTE — Progress Notes (Signed)
 DC instructions reviewed with patient and granddaughter Makayla. All questions answered. Paramedic Tyrell at bedside.

## 2024-06-09 LAB — CULTURE, BLOOD (ROUTINE X 2)
Culture: NO GROWTH
Culture: NO GROWTH
Special Requests: ADEQUATE
Special Requests: ADEQUATE

## 2024-06-21 ENCOUNTER — Encounter (HOSPITAL_COMMUNITY)
Admission: RE | Admit: 2024-06-21 | Discharge: 2024-06-21 | Disposition: A | Discharge status: Home / Self Care | Source: Ambulatory Visit

## 2024-06-21 ENCOUNTER — Inpatient Hospital Stay

## 2024-06-21 DIAGNOSIS — D709 Neutropenia, unspecified: Secondary | ICD-10-CM

## 2024-06-21 DIAGNOSIS — C679 Malignant neoplasm of bladder, unspecified: Secondary | ICD-10-CM | POA: Insufficient documentation

## 2024-06-21 LAB — CBC WITH DIFFERENTIAL (CANCER CENTER ONLY)
Abs Immature Granulocytes: 0.01 K/uL (ref 0.00–0.07)
Basophils Absolute: 0 K/uL (ref 0.0–0.1)
Basophils Relative: 0 %
Eosinophils Absolute: 0 K/uL (ref 0.0–0.5)
Eosinophils Relative: 1 %
HCT: 35.4 % — ABNORMAL LOW (ref 39.0–52.0)
Hemoglobin: 11.6 g/dL — ABNORMAL LOW (ref 13.0–17.0)
Immature Granulocytes: 0 %
Lymphocytes Relative: 18 %
Lymphs Abs: 0.5 K/uL — ABNORMAL LOW (ref 0.7–4.0)
MCH: 31.4 pg (ref 26.0–34.0)
MCHC: 32.8 g/dL (ref 30.0–36.0)
MCV: 95.7 fL (ref 80.0–100.0)
Monocytes Absolute: 0.3 K/uL (ref 0.1–1.0)
Monocytes Relative: 13 %
Neutro Abs: 1.7 K/uL (ref 1.7–7.7)
Neutrophils Relative %: 68 %
Platelet Count: 150 K/uL (ref 150–400)
RBC: 3.7 MIL/uL — ABNORMAL LOW (ref 4.22–5.81)
RDW: 14 % (ref 11.5–15.5)
WBC Count: 2.5 K/uL — ABNORMAL LOW (ref 4.0–10.5)
nRBC: 0 % (ref 0.0–0.2)

## 2024-06-21 LAB — CMP (CANCER CENTER ONLY)
ALT: 10 U/L (ref 0–44)
AST: 18 U/L (ref 15–41)
Albumin: 3.9 g/dL (ref 3.5–5.0)
Alkaline Phosphatase: 63 U/L (ref 38–126)
Anion gap: 10 (ref 5–15)
BUN: 25 mg/dL — ABNORMAL HIGH (ref 8–23)
CO2: 25 mmol/L (ref 22–32)
Calcium: 9.2 mg/dL (ref 8.9–10.3)
Chloride: 103 mmol/L (ref 98–111)
Creatinine: 1.53 mg/dL — ABNORMAL HIGH (ref 0.61–1.24)
GFR, Estimated: 45 mL/min — ABNORMAL LOW
Glucose, Bld: 110 mg/dL — ABNORMAL HIGH (ref 70–99)
Potassium: 4.5 mmol/L (ref 3.5–5.1)
Sodium: 138 mmol/L (ref 135–145)
Total Bilirubin: 0.5 mg/dL (ref 0.0–1.2)
Total Protein: 7.2 g/dL (ref 6.5–8.1)

## 2024-06-21 LAB — VITAMIN B12: Vitamin B-12: 634 pg/mL (ref 180–914)

## 2024-06-21 LAB — GLUCOSE, CAPILLARY: Glucose-Capillary: 116 mg/dL — ABNORMAL HIGH (ref 70–99)

## 2024-06-21 LAB — FOLATE: Folate: 5.3 ng/mL — ABNORMAL LOW

## 2024-06-21 MED ORDER — FLUDEOXYGLUCOSE F - 18 (FDG) INJECTION
7.1000 | Freq: Once | INTRAVENOUS | Status: AC
  Administered 2024-06-21: 6.97 via INTRAVENOUS

## 2024-06-27 DIAGNOSIS — E538 Deficiency of other specified B group vitamins: Secondary | ICD-10-CM | POA: Insufficient documentation

## 2024-06-27 NOTE — Progress Notes (Unsigned)
 [facility] OFFICE PROGRESS NOTE  Patient Care Team: Clinic, [facility] Lien as PCP - General Kristie Lamprey, MD as Consulting Physician (Gastroenterology)  Kenneth Sanford is a 84 y.o.male with history of BPH with TURP, CKD3, arthritis, hypothyroidism [facility].   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 The carcinoma involving adjacent prostatic ducts and stroma. CT reports negative for metastatic disease. Clinical [id number] stage IIIA.   Completed chemotherapy with radiation on 10/8  PET showed NED. Repeat lab, H&P and imaging in 3 months.  Recovering from pneumonia. Continue working with PT for deconditioning.  Assessment & Plan Urothelial carcinoma of bladder (HCC) follow-up in April with labs, imaging and H&P Continue cystoscopy with Dr. Carolee. Will comminute with him Folate deficiency Folic acid  daily. Script sent to pharamcy Port-A-Cath in place Follow up for flushing in April with labs Bladder spasm Continue oxybutynin  if needed at night. Physical deconditioning Continue PT and activities. Normocytic anemia Will add ferritin Repeat folate and b12 with next visit Pulmonary nodules Recent pneumonia Will repeat staging PET in early April  Orders Placed This Encounter  Procedures   NM PET Image Restag (PS) Skull Base To Thigh    Standing Status:   Future    Expected Date:   09/17/2024    Expiration Date:   06/28/2025    If indicated for the ordered procedure, I authorize the administration of a radiopharmaceutical per Radiology protocol:   Yes    Preferred imaging location?:   [place]   CBC with Differential (Cancer Center Only)    Standing Status:   Future    Expiration Date:   06/28/2025   CMP (Cancer Center only)    Standing Status:   Future    Expiration Date:   06/28/2025   Vitamin B12    Standing Status:   Future    Expiration Date:   06/28/2025   Folate     Standing Status:   Future    Expiration Date:   06/28/2025   Ferritin    Standing Status:   Future    Expiration Date:   06/28/2025     Kenneth JAYSON Chihuahua, MD  INTERVAL HISTORY: Patient returns for follow-up. Recently had PNA. He had lost of appetite. He had some diarrhea after antibiotics. He was on breathing treatment. Diarrhea stopped. No stomach pain. Coughing resolved. No trouble urinating, hematuria.   Oncology History  Urothelial carcinoma of bladder (HCC)  09/14/2021 Pathology Results   Prostate, chips BENIGN NODULAR GLANDULAR AND STROMAL HYPERPLASIA MILD CHRONIC PROSTATITIS NEGATIVE FOR CARCINOMA   09/15/2023 Cancer Staging   Staging form: Urinary Bladder, AJCC 8th Edition - Clinical stage from 09/15/2023: Stage IIIA (cT4a, cN0, cM0) - Signed by Sherwood Rise, PA-C on 01/15/2024 Stage prefix: Initial diagnosis WHO/ISUP grade (low/high): High Grade Histologic grading system: 2 grade system   10/13/2023 Imaging   CT AP w/wo  At least 2 small enhancing mural nodules along the right lateral bladder wall suspicious for bladder carcinoma..  Markedly enlarged prostate gland which indents the bladder base. Asymmetric soft tissue density again seen protruding from the right lateral prostate base and right seminal vesicle. Prostate carcinoma with invasino of right seminal vesicle cannot be excluded.  No evidence of metastatic disease.  No evidenced of upper urinary tract neoplasm, urolithiasis or hydronephrosis.    10/31/2023 Pathology Results   A. BLADDER, TUMOR, TURBT:  Infiltrating high grade papillary urothelial carcinoma with  micropapillary (40%) component  The carcinoma invades muscularis propria (detrusor muscle)   B. PROSTATE CHIPS, TURP:  High grade urothelial carcinoma  The carcinoma involving adjacent prostatic ducts and stroma    11/18/2023 Initial Diagnosis   Urothelial carcinoma of bladder (HCC)   02/03/2024 -  Chemotherapy   Omitted week 3 & 6 of chemotherapy due  to cytopenia Decreased to weekly around week 7 8 due to persistent cytopenia Patient is on Treatment Plan : BLADDER Gemcitabine  Twice Weekly + XRT         PHYSICAL EXAMINATION: ECOG PERFORMANCE STATUS: 2  Vitals:   06/28/24 0841  BP: 128/77  Pulse: 99  Resp: 16  Temp: 98.2 F (36.8 C)  SpO2: 98%   Filed Weights   06/28/24 0841  Weight: 133 lb (60.3 kg)    GENERAL: alert, no distress and comfortable SKIN: skin color normal. Port site clear EYES: sclera clear LUNGS:  normal breathing effort Musculoskeletal: no edema   Relevant data reviewed during this visit included labs.  New labs and imaging ordered.

## 2024-06-27 NOTE — Assessment & Plan Note (Signed)
 follow-up in April with labs, imaging and H&P Continue cystoscopy with Dr. Carolee. Will comminute with him

## 2024-06-27 NOTE — Assessment & Plan Note (Signed)
 Follow up for flushing in April with labs

## 2024-06-27 NOTE — Assessment & Plan Note (Signed)
 Folic acid  daily. Script sent to [facility]

## 2024-06-28 ENCOUNTER — Inpatient Hospital Stay

## 2024-06-28 VITALS — BP 128/77 | HR 99 | Temp 98.2°F | Resp 16 | Wt 133.0 lb

## 2024-06-28 DIAGNOSIS — C679 Malignant neoplasm of bladder, unspecified: Secondary | ICD-10-CM | POA: Diagnosis not present

## 2024-06-28 DIAGNOSIS — E538 Deficiency of other specified B group vitamins: Secondary | ICD-10-CM | POA: Diagnosis not present

## 2024-06-28 DIAGNOSIS — R918 Other nonspecific abnormal finding of lung field: Secondary | ICD-10-CM | POA: Diagnosis not present

## 2024-06-28 DIAGNOSIS — Z95828 Presence of other vascular implants and grafts: Secondary | ICD-10-CM | POA: Diagnosis not present

## 2024-06-28 DIAGNOSIS — D649 Anemia, unspecified: Secondary | ICD-10-CM | POA: Diagnosis not present

## 2024-06-28 DIAGNOSIS — R5381 Other malaise: Secondary | ICD-10-CM | POA: Insufficient documentation

## 2024-06-28 DIAGNOSIS — N3289 Other specified disorders of bladder: Secondary | ICD-10-CM

## 2024-06-28 MED ORDER — FOLIC ACID 1 MG PO TABS: 1.0000 mg | ORAL_TABLET | Freq: Every day | ORAL | 1 refills | Status: AC

## 2024-06-28 NOTE — Assessment & Plan Note (Addendum)
 Continue oxybutynin  if needed at night.

## 2024-06-28 NOTE — Assessment & Plan Note (Addendum)
 Continue PT and activities.

## 2024-06-29 ENCOUNTER — Other Ambulatory Visit: Payer: Self-pay

## 2024-09-17 ENCOUNTER — Inpatient Hospital Stay

## 2024-09-24 ENCOUNTER — Inpatient Hospital Stay
